# Patient Record
Sex: Female | Born: 1935 | Race: Black or African American | Hispanic: No | Smoking: Former smoker
Health system: Southern US, Community
[De-identification: ages and names within clinical notes are randomized; demographics above are authoritative.]

## PROBLEM LIST (undated history)

## (undated) DIAGNOSIS — R350 Frequency of micturition: Secondary | ICD-10-CM

## (undated) DIAGNOSIS — J189 Pneumonia, unspecified organism: Secondary | ICD-10-CM

## (undated) DIAGNOSIS — I509 Heart failure, unspecified: Secondary | ICD-10-CM

## (undated) DIAGNOSIS — F0390 Unspecified dementia without behavioral disturbance: Secondary | ICD-10-CM

## (undated) DIAGNOSIS — J449 Chronic obstructive pulmonary disease, unspecified: Secondary | ICD-10-CM

## (undated) DIAGNOSIS — K5900 Constipation, unspecified: Secondary | ICD-10-CM

## (undated) DIAGNOSIS — I10 Essential (primary) hypertension: Secondary | ICD-10-CM

## (undated) DIAGNOSIS — M4800 Spinal stenosis, site unspecified: Secondary | ICD-10-CM

## (undated) DIAGNOSIS — R32 Unspecified urinary incontinence: Secondary | ICD-10-CM

## (undated) HISTORY — DX: Constipation, unspecified: K59.00

## (undated) HISTORY — DX: Unspecified urinary incontinence: R32

## (undated) HISTORY — DX: Frequency of micturition: R35.0

## (undated) HISTORY — DX: Essential (primary) hypertension: I10

---

## 1989-05-29 HISTORY — PX: COLONOSCOPY: SHX174

## 1997-08-04 ENCOUNTER — Ambulatory Visit (HOSPITAL_COMMUNITY): Admission: RE | Admit: 1997-08-04 | Discharge: 1997-08-04 | Payer: Self-pay | Admitting: Internal Medicine

## 1997-12-22 ENCOUNTER — Ambulatory Visit (HOSPITAL_COMMUNITY): Admission: RE | Admit: 1997-12-22 | Discharge: 1997-12-22 | Payer: Self-pay | Admitting: Internal Medicine

## 1998-02-05 ENCOUNTER — Ambulatory Visit (HOSPITAL_COMMUNITY): Admission: RE | Admit: 1998-02-05 | Discharge: 1998-02-05 | Payer: Self-pay | Admitting: *Deleted

## 1998-03-24 ENCOUNTER — Ambulatory Visit (HOSPITAL_COMMUNITY): Admission: RE | Admit: 1998-03-24 | Discharge: 1998-03-24 | Payer: Self-pay | Admitting: Internal Medicine

## 1999-02-04 ENCOUNTER — Ambulatory Visit (HOSPITAL_COMMUNITY): Admission: RE | Admit: 1999-02-04 | Discharge: 1999-02-04 | Payer: Self-pay | Admitting: *Deleted

## 1999-04-15 ENCOUNTER — Encounter: Admission: RE | Admit: 1999-04-15 | Discharge: 1999-04-15 | Payer: Self-pay | Admitting: Internal Medicine

## 1999-04-15 ENCOUNTER — Encounter: Payer: Self-pay | Admitting: Internal Medicine

## 1999-07-04 ENCOUNTER — Encounter: Admission: RE | Admit: 1999-07-04 | Discharge: 1999-07-04 | Payer: Self-pay | Admitting: Internal Medicine

## 1999-07-04 ENCOUNTER — Encounter: Payer: Self-pay | Admitting: Internal Medicine

## 1999-10-18 ENCOUNTER — Ambulatory Visit (HOSPITAL_COMMUNITY): Admission: RE | Admit: 1999-10-18 | Discharge: 1999-10-18 | Payer: Self-pay | Admitting: Internal Medicine

## 2000-07-09 ENCOUNTER — Encounter: Admission: RE | Admit: 2000-07-09 | Discharge: 2000-07-09 | Payer: Self-pay | Admitting: Oncology

## 2000-07-09 ENCOUNTER — Encounter: Payer: Self-pay | Admitting: Internal Medicine

## 2000-12-17 ENCOUNTER — Other Ambulatory Visit: Admission: RE | Admit: 2000-12-17 | Discharge: 2000-12-17 | Payer: Self-pay | Admitting: Obstetrics and Gynecology

## 2001-03-21 ENCOUNTER — Encounter: Admission: RE | Admit: 2001-03-21 | Discharge: 2001-03-21 | Payer: Self-pay | Admitting: Internal Medicine

## 2001-03-21 ENCOUNTER — Encounter: Payer: Self-pay | Admitting: Internal Medicine

## 2001-08-07 ENCOUNTER — Encounter: Payer: Self-pay | Admitting: Internal Medicine

## 2001-08-07 ENCOUNTER — Encounter: Admission: RE | Admit: 2001-08-07 | Discharge: 2001-08-07 | Payer: Self-pay | Admitting: Internal Medicine

## 2001-09-30 ENCOUNTER — Encounter: Payer: Self-pay | Admitting: Internal Medicine

## 2001-09-30 ENCOUNTER — Encounter: Admission: RE | Admit: 2001-09-30 | Discharge: 2001-09-30 | Payer: Self-pay | Admitting: Internal Medicine

## 2002-02-11 ENCOUNTER — Ambulatory Visit (HOSPITAL_COMMUNITY): Admission: RE | Admit: 2002-02-11 | Discharge: 2002-02-11 | Payer: Self-pay | Admitting: Internal Medicine

## 2002-08-11 ENCOUNTER — Encounter: Payer: Self-pay | Admitting: Internal Medicine

## 2002-08-11 ENCOUNTER — Ambulatory Visit (HOSPITAL_COMMUNITY): Admission: RE | Admit: 2002-08-11 | Discharge: 2002-08-11 | Payer: Self-pay | Admitting: Internal Medicine

## 2003-04-30 ENCOUNTER — Emergency Department (HOSPITAL_COMMUNITY): Admission: EM | Admit: 2003-04-30 | Discharge: 2003-05-01 | Payer: Self-pay | Admitting: Emergency Medicine

## 2004-09-07 ENCOUNTER — Emergency Department (HOSPITAL_COMMUNITY): Admission: EM | Admit: 2004-09-07 | Discharge: 2004-09-07 | Payer: Self-pay | Admitting: Emergency Medicine

## 2005-05-10 ENCOUNTER — Emergency Department (HOSPITAL_COMMUNITY): Admission: EM | Admit: 2005-05-10 | Discharge: 2005-05-10 | Payer: Self-pay | Admitting: Emergency Medicine

## 2005-05-11 ENCOUNTER — Ambulatory Visit (HOSPITAL_COMMUNITY): Admission: RE | Admit: 2005-05-11 | Discharge: 2005-05-11 | Payer: Self-pay | Admitting: Internal Medicine

## 2005-06-01 ENCOUNTER — Inpatient Hospital Stay (HOSPITAL_COMMUNITY): Admission: EM | Admit: 2005-06-01 | Discharge: 2005-06-22 | Payer: Self-pay | Admitting: Emergency Medicine

## 2005-06-04 ENCOUNTER — Ambulatory Visit: Payer: Self-pay | Admitting: Internal Medicine

## 2006-06-22 ENCOUNTER — Emergency Department (HOSPITAL_COMMUNITY): Admission: EM | Admit: 2006-06-22 | Discharge: 2006-06-22 | Payer: Self-pay | Admitting: Emergency Medicine

## 2008-03-19 ENCOUNTER — Inpatient Hospital Stay (HOSPITAL_COMMUNITY): Admission: EM | Admit: 2008-03-19 | Discharge: 2008-03-30 | Payer: Self-pay | Admitting: Emergency Medicine

## 2008-03-19 ENCOUNTER — Ambulatory Visit: Payer: Self-pay | Admitting: Internal Medicine

## 2008-03-20 ENCOUNTER — Encounter (INDEPENDENT_AMBULATORY_CARE_PROVIDER_SITE_OTHER): Payer: Self-pay | Admitting: Internal Medicine

## 2010-10-11 NOTE — Discharge Summary (Signed)
[name], [name]            ACCOUNT NO.:  [id number]   MEDICAL RECORD NO.:  [id number]          PATIENT TYPE:  INP   LOCATION:  1317                         FACILITY:  [facility]   PHYSICIAN:  Isidor Holts, M.D.  DATE OF BIRTH:  05/14/1920   DATE OF ADMISSION:  03/19/2008  DATE OF DISCHARGE:                               DISCHARGE SUMMARY   ADDENDUM TO DISCHARGE SUMMARY:   DATE OF DISCHARGE:  March 30, 2008.   For discharge diagnoses, refer to interim summary dictated March 24, 2008 by Dr. Hillery Aldo.  For procedures and detailed clinical  course, refer to above-mentioned summary.  However, for the period from  March 25, 2008 to March 29, 2008, the following are pertinent:  The  patient's clinical condition remained stable.  She completed the course  of antibiotic therapy for E-coli urinary tract infection as well as  aspiration pneumonia.  Antibiotics were discontinued on March 26, 2008  without any deleterious effects.  Hemoglobin has remained  stable/reasonable.  As a matter of fact on March 25, 2008 hemoglobin  was 9.3 with hematocrit of 28.1.  Iron studies showed the following  findings:  Iron 44, TIBC 146, percentage saturation 30, ferritin 417.  These findings against background of normocytosis, suggest anemia of  chronic disease.  There were no problems referable to COPD.  The patient  is of course demented.  She does have a known history of spinal  stenosis, however, there have been no problems referable to this and  during the above-mentioned period of time, the patient's BP has remained  stable/controlled.  As a matter of fact on March 29, 2008, BP was  131/69 mmHg.  The patient has been evaluated by PT/OT, and skilled  nursing facility recommended.  This has been discussed with the  patient's family and the family is agreeable with SNF placement.   DISCHARGE MEDICATIONS:  1. Multivitamin 1 p.o. daily.  2. Norvasc 10 mg p.o. daily.  3. Resource  Wild Berry liquid nutritional supplement 240 mL p.o.      b.i.d.  4. Senokot-S 1 p.o. p.r.n. nightly.  5. Tylenol 650 mg p.o. p.r.n. every 4 hourly.   DISPOSITION:  Provided placement can be effected, it is anticipated that  the patient will be discharged on March 30, 2008 or March 31, 2008.   DIET:  Heart-healthy.   ACTIVITY:  As tolerated.  Otherwise per PT/OT.   FOLLOWUP INSTRUCTIONS:  The patient is to follow up with nursing  facility medical doctor.   SPECIAL INSTRUCTIONS:  Continued PT/OT is recommended in the skilled  nursing facility environment.      Isidor Holts, M.D.  Electronically Signed     CO/MEDQ  D:  03/29/2008  T:  03/29/2008  Job:  [id number]

## 2010-10-11 NOTE — Group Therapy Note (Signed)
[name], [name]            ACCOUNT NO.:  [id number]   MEDICAL RECORD NO.:  [id number]          PATIENT TYPE:  INP   LOCATION:  1317                         FACILITY:  [facility]   PHYSICIAN:  Hillery Aldo, M.D.   DATE OF BIRTH:  [birth year]                                 PROGRESS NOTE   PRIMARY CARE PHYSICIAN:  None.   DISCHARGE DIAGNOSES:  1. Escherichia coli urinary tract infection.  2. Right middle lobe aspiration pneumonia.  3. Weakness with fall prior to admission.  4. Dehydration  5. Hypernatremia.  6. Hypokalemia.  7. Hypertension.  8. Normocytic anemia.  9. Diarrhea.  10.History of dementia.  11.History of spinal stenosis.  12.History of chronic obstructive pulmonary disease.   DISCHARGE MEDICATIONS:  Will be dictated time of actual discharge.   CONSULTATIONS:  None.   HISTORY OF PRESENT ILLNESS:  The patient is an 77 year old female who was not under the care of any  medical doctor, who was brought in by her daughter secondary to the  patient falling at home and being weak with inability to ambulate with  her walker.  The patient was living independently prior to admission and  therefore was admitted for further evaluation and workup.  For full  details, please see the dictated report done by Dr. Wilburn Mylar.   PROCEDURES AND DIAGNOSTIC STUDIES:  1. CT scan of the head on March 19, 2008 showed no acute      intracranial abnormality with chronic microvascular ischemic      changes and atherosclerosis.  2. Chest x-ray on March 21, 2008 showed possible early right midlung      zone infiltrate and cardiomegaly.   HOSPITAL COURSE BY PROBLEM:  1. .  Escherichia coli urinary tract infection.  The patient was      empirically put on Cipro.  Subsequent urine cultures grew out      Escherichia coli that was Cipro sensitive.  Her antibiotic coverage      was broadened when she developed signs of a possible aspiration      pneumonia.  She remains on antibiotic  therapy with Zosyn at this      time.  2. Aspiration pneumonia:  The patient had an episode of choking on      food that was noted by the nursing staff.  They also noted      increased congestion following this episode which prompted a chest      radiograph.  The radiographic findings were as noted above.  The      patient's antibiotic therapy was broadened to Zosyn and a      swallowing evaluation was ordered.  The patient did not demonstrate      any signs of aspiration during this evaluation and it was felt to      be an isolated event.  She will get a follow-up chest radiograph in      the morning and if it is clearing, we can amend her antibiotic      therapy further.  3. Weakness with outpatient fall.  The patient has been seen in  consultation with physical and occupational therapist.  They are      recommending skilled nursing home placement at this time unless 24-      hour supervision can be provided by the family.  4. Dehydration/hypernatremia:  The patient was put on hypotonic IV      fluids and this has resolved.  5. Hypertension:  The patient was noted to be hypertensive during her      hospital stay.  She was put on Norvasc which has controlled her      blood pressure well.  6. Normocytic anemia:  The patient did undergo vitamin B12 and RBC      folate studies which were found to be within normal limits.  Her      anemia is likely due to chronic disease.  Her hemoglobin and      hematocrit have stabilized after an initial drop that was thought      to be dilutional due to dehydration and repletion with IV fluids.      Nevertheless, we are going to heme-check her stools and evaluate      her iron stores as part of her workup.  The studies are pending at      this time.  7. Diarrhea.  The patient has been on antibiotics.  C-difficile toxin      studies have been negative.  This os likely a side effect of      antibiotic therapy.  We will continue to monitor for signs  of      electrolytes or fluid balance problems.   DISPOSITION:  Patient is approaching medical stability and will need placement in a  skilled nursing facility unless 24-hour supervision can be provided.  Care managers are attempting to contact the patient's family to discuss  these options at this time.   A follow-up discharge summary addendum will be dictated at the time of  actual discharge.      Hillery Aldo, M.D.  Electronically Signed     CR/MEDQ  D:  03/24/2008  T:  03/24/2008  Job:  [id number]

## 2010-10-11 NOTE — H&P (Signed)
NAMESANDY, HAYE            ACCOUNT NO.:  [id number]   MEDICAL RECORD NO.:  [id number]          PATIENT TYPE:  EMS   LOCATION:  ED                           FACILITY:  [facility]   PHYSICIAN:  Thomasenia Bottoms, MDDATE OF BIRTH:  [birth year]   DATE OF ADMISSION:  03/19/2008  DATE OF DISCHARGE:                              HISTORY & PHYSICAL   CHIEF COMPLAINT:  Fall.   HISTORY OF PRESENT ILLNESS:  Ms. Vanliew is an 77 year old who lives  alone and brought in by her daughter tonight because the patient fell  today and has been unable to walk even with her walker.  She has just  been very weak.  The patient denies any pain.  The patient normally  lives alone and gets along with her walker.  Her daughter brings all of  her meals over to the house.  The patient is not able to provide any  history.   PAST MEDICAL HISTORY:  1. Dementia.  2. Fecal incontinence.  3. History of anemia.  4. History of COPD.  5. Spinal stenosis.   Her last hospitalization was in 2007 and was also because of fall.   MEDICATIONS:  The patient apparently takes no medicines.   PRIMARY CARE PHYSICIAN:  She has no primary care physician at this time.   SOCIAL HISTORY:  She does not smoke cigarettes, drink alcohol or use any  illicit drugs.   FAMILY HISTORY:  Noncontributory.   REVIEW OF SYSTEMS:  The patient is not able to provide this.  The  daughter does say that the patient eats like a bird, but this is her  baseline.  Normally, she is able to get around with a walker.   PHYSICAL EXAMINATION:  VITAL SIGNS:  In the emergency department on  arrival, her T-max was 98.2.  Her blood pressure was 145/82 with pulse  80, respiratory rate 18 and O2 sats 100% on room air.  GENERAL:  The patient is an elderly woman in no acute distress.  She  does not talk much.  She will answer my questions with one-word answers  only.  She is a frail elderly woman.  HEENT:  Normocephalic, atraumatic.  Pupils are round.   Sclerae  nonicteric.  Oral mucosa moist.  NECK:  Supple.  No lymphadenopathy, thyromegaly or jugular venous  distention.  CARDIAC:  Regular rate and rhythm with a systolic murmur.  LUNGS:  Clear to auscultation bilaterally.  No wheezes, rhonchi or  rales.  ABDOMEN:  Soft, nontender, nondistended.  Normoactive bowel sounds.  No  masses are appreciated.  No hepatosplenomegaly.  EXTREMITIES:  No evidence of clubbing, cyanosis or edema.  NEUROLOGICAL:  She is alert and oriented x3.  Her cranial nerves II-XII  are intact grossly.  She moves both of her arms after extensive coaxing.  She says she is unable to lift her legs off the bed.  She has symmetric  finger squeeze bilaterally.  She does not answer my questions to  orientation.  SKIN:  Some minor abrasions which are mild.  She has extensive dry skin  of her feet.  No open  lesions or rashes noted.  MUSCULOSKELETAL:  No evidence of effusion of her joints.  She resists  me, and it is difficult for me to assess her range of motion.   LABORATORY DATA:  White count 13.2, hemoglobin 11, hematocrit 32.4,  platelet count 212,000.  Sodium 146, potassium 3.1, chloride 112, bicarb  22, glucose 140, BUN 27, creatinine 1, AST 52.  Urinalysis reveals  cloudy urine with positive nitrite, positive moderate leukocyte esterase  and 7-10 WBCs.  She did have a head CT which revealed no acute  intracranial abnormality.   ASSESSMENT AND PLAN:  1. This is an 78 year old woman with weakness and a fall.  She does      have a urinary tract infection.  Will admit her to the hospital and      put her on IV Cipro.  2. Mild dehydration and hypernatremia as well as elevated BUN.  Will      put her on IV fluids and follow.  3. Hypokalemia.  We will replace this both IV and p.o.  The patient's      daughter would like the patient to be able to go back to home with      home nursing and home PT, but, given the fact that the patient has      dementia and an unsteady  gait, I wonder if it is safe for her to be      alone.  Her last hospitalization was in 2007, and nursing home      placement was recommended at that time.  After her urinary tract      infection is treated, we will have her evaluated by physical      therapy.  Also, the daughter would like recommendations for a new      primary care physician as the patient currently has none.      Thomasenia Bottoms, MD  Electronically Signed     CVC/MEDQ  D:  03/19/2008  T:  03/20/2008  Job:  [id number]

## 2010-10-14 NOTE — Consult Note (Signed)
[name], [name]            ACCOUNT NO.:  [id number]   MEDICAL RECORD NO.:  [id number]          PATIENT TYPE:  INP   LOCATION:  1414                         FACILITY:  [facility]   PHYSICIAN:  Jordan Hawks. Elnoria Howard, MD    DATE OF BIRTH:  12/23/1919   DATE OF CONSULTATION:  06/02/2005  DATE OF DISCHARGE:                                   CONSULTATION   REFERRING PHYSICIAN:  Eric L. August Saucer, M.D.   REASON FOR CONSULTATION:  Diarrhea and weakness.   HISTORY OF PRESENT ILLNESS:  This is an 77 year old, black female with the  past medical history of mild dementia and COPD admitted for recurrent and  intermittent diarrhea.  The patient was brought in by her daughter after she  was found to be in a pool of her excrement at home.  The patient is not able  to provide an accurate history and details were obtained from the chart.  The patient denies having any types of fever and she believes she has had  diarrhea for the past 2 days.  She has not noted any hematochezia or melena.  The patient states that overall she is in her usual state of health,  although she has had some bilateral lower extremity edema.  The patient  believes that she had a colonoscopy 2-3 years ago and it was performed by a  local physician, however, she is uncertain of the physician's name and the  results of the colonoscopy.  To the patient's knowledge, she denies having  any family history of colon cancer.  Upon admission, the patient was  provided with Imodium and she states that has helped prevent any further  diarrhea at this time.  Additionally, she does have some complaints of a  very mild left lower quadrant abdominal pain without any radiation.   PAST MEDICAL HISTORY:  As stated above.   PAST SURGICAL HISTORY:  As stated above.   FAMILY HISTORY:  Noncontributory.   SOCIAL HISTORY:  The patient lives alone, however, has close family support  from her daughter and a sister who lives nearby.   ALLERGIES:  No known  drug allergies.   MEDICATIONS:  1.  Spironolactone 25 mg p.o. daily.  2.  Iron 325 mg p.o. daily.   REVIEW OF SYSTEMS:  Unable to accurately obtain secondary to the patient's  dementia.   PHYSICAL EXAMINATION:  VITAL SIGNS:  Blood pressure is 126/67, heart rate  69, temperature 99, respirations 20.  GENERAL:  The patient is in no acute distress.  She is alert and oriented to  herself and place.  HEENT:  Normocephalic, atraumatic, extraocular movements intact.  Pupils  equal round and reactive to light.  NECK:  Supple.  No lymphadenopathy.  LUNGS:  Clear to auscultation bilaterally.  CARDIAC:  Regular rate and rhythm without murmurs, rubs or gallops.  ABDOMEN:  Flat, soft, minimal tenderness with deep palpation over the left  lower quadrant.  No rebound.  Positive bowel sounds.  No hepatosplenomegaly.  EXTREMITIES:  No clubbing, cyanosis or edema.  RECTAL:  Negative for any blood.  There is solid stool that is  palpated in  the rectal vault.  The patient has excellent resting sphincter tone.   LABORATORY DATA AND X-RAY FINDINGS:  White blood cell count 8.6, hemoglobin  10.1, MCV 89.3, platelets 226.  Sodium 141, potassium 3.5, chloride 108, CO2  26, glucose 100, BUN 26, creatinine 0.9.  Total bilirubin 0.6, Alk phos 57,  AST 24, ALT 13, albumin 3.3.  Urinalysis is negative for any abnormality.   IMPRESSION:  1.  History of diarrhea.  2.  Anemia.  3.  Mild left lower quadrant pain.  4.  Mild dementia.  5.  Mild chronic obstructive pulmonary disease.   At this time, I am unable to discern an etiology for the patient's diarrhea.  Certainly, the rectal examination is very revealing in that she does have  solid stool in the rectal vault.  Certainly, this can be a consequence of  Imodium that was provided, however, no rectal mass has been palpated at this  time and she has excellent sphincter tone.  Therefore, an incontinence issue  as a result of sphincter abnormalities is not  apparent at this time.  She  appears to be in stable condition.  She was noted to have some mild left  lower quadrant pain, however, there is no elevation in her white blood cell  count.  She is noted to have a very mild temperature at 100.1 upon admission  and is currently at 99.  She does not have the clinical signs and symptoms  that are consistent with a diverticulitis.  Although if the pain worsens, a  computed tomography scan of the abdomen can be performed.  The differential  diagnoses for the patient's diarrhea ranges from infectious, inflammatory,  such as a microscopic colitis, malignant with result in overflow  incontinence, secondary to medications or that may be taken over-the-counter  or as a result of artificial sweeteners such as sorbitol.   RECOMMENDATIONS:  1.  Plan at this time is to check the stool studies including a C. difficile      toxin.  2.  Hold the Imodium at this time to allow for diarrhea to manifest.  3.  Attempt to obtain a colonoscopy report.  4.  If diarrhea persists without clear etiology, with stool studies being      negative, a colonoscopy versus a flexible sigmoidoscopy will be pursued      with biopsy.  5.  If the patient is stable and discharged during this weekend, I will      follow her up in the office.  6.  Check an iron panel and ferritin to work up the anemia.      Jordan Hawks Elnoria Howard, MD  Electronically Signed     PDH/MEDQ  D:  06/02/2005  T:  06/03/2005  Job:  [id number]

## 2010-10-14 NOTE — Discharge Summary (Signed)
NAME:  [name], [name]            ACCOUNT NO.:  [id number]   MEDICAL RECORD NO.:  [id number]          PATIENT TYPE:  INP   LOCATION:  1414                         FACILITY:  [facility]   PHYSICIAN:  Eric L. August Saucer, M.D.     DATE OF BIRTH:  02-Aug-1919   DATE OF ADMISSION:  06/01/2005  DATE OF DISCHARGE:  06/21/2005                                 DISCHARGE SUMMARY   FINAL DIAGNOSES:  1.  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  2.  Dementia.  3.  Spinal stenosis of the lumbar sacral spine.  4.  Unsteady gait.  5.  Chronic obstructive pulmonary disease.  6.  Anemia of chronic disease.  7.  Cellulitis, left lower extremity, resolved.  8.  Deconditioning.  9.  Unsafe home environment.   OPERATIONS/PROCEDURES:  1.  CT scan lumbosacral spine.  2.  CT scan of the abdomen and pelvis.  3.  Bone scan.   HISTORY OF PRESENT ILLNESS:  This was the first recent [facility]  admission for this 77 year old, widowed, black female with a longstanding  history of mild dementia with a past history of COPD.  The patient had most  recently been lost to followup over the past year and had been seen in the  office just recently with lower extremity edema.  The patient was started on  spironolactone 25 mg daily.  For the past 10 days prior to admission, the  patient had developed increasing bouts of diarrhea.  This was associated  with progressive weakness as well.  The patient had noted __________  lower  abdominal discomfort though denied significant pain at the time of  presentation.  Daughter reports that __________ has been fair to good at  most.  The patient, however, on the day of admission did not respond to her  daughter's call to the house.  When found, the patient was surrounded by  stool and urine and unable to walk.  She was subsequently brought in for  further evaluation.   PAST MEDICAL HISTORY:  Per admission H&P.   PHYSICAL EXAMINATION:  Per admission H&P.   HOSPITAL COURSE:  The patient was admitted for further evaluation and  treatment of refractory diarrhea of questionable etiology.  She was known to  have some dementia as well, past history of tobacco abuse.  At the time of  presentation there was evidence for dehydration.  She had lower leg pains as  well with a question of DVT.  The patient was placed on IV fluids for  rehydration.  Stool specimens were obtained for leukocytes, C. difficile,  and enteric pathogens.  The night of admission, the patient had an extremely  large bowel movement.  She thereafter had no significant diarrhea.  Subsequent abdominal films showed no evidence for obstruction.  There was no  evidence for ileus as well.   The patient was seen in consultation by Dr. Elnoria Howard of gastroenterology.  He  entertained a number of possibilities for the cause of her diarrhea.  The  stool studies, as noted, were negative for C. difficile, leukocytes, or  enteric pathogens.  After the  patient's large BM, she had no further  significant diarrhea.  It was felt that she had a nonsphincter fecal  incontinence phenomenon.  The patient was maintained on stool softeners with  Metamucil.  She was started on a full liquid diet and gradually advanced  thereafter.   The patient at the time of presentation also complained of left lower leg  pain.  A venous Doppler study was done which showed no evidence for DVT,  superficial thrombus, or Baker cyst.  She was however, due to her  inactivity, placed on Lovenox per protocol.  Over the subsequent days this  did gradually improve.  It was noted, however, that the patient had some  persistence of warmth in the leg with minimal erythematous changes.  A sed  rate was noted to be elevated as well.  She was treated empirically for a  possible early cellulitis.  X-rays of the legs were obtained as well which  raised the question of some periosteal reaction in the bones.  A subsequent  bone scan was  obtained which showed no evidence for osteomyelitis.  The  patient, however, was treated with a 10-day course of Avelox with gradual  resolution of the redness as well as decreased tenderness.  Her sed rates  did drop from high 70s to 50s.   Despite the above measures, the patient continued to have difficulty with  gait.  X-ray of the lumbosacral spine did demonstrate significant  degenerative joint disease of the L, S spine with some spondylolisthesis.  A  CT scan of the lumbosacral spine did demonstrate moderate spinal stenosis in  the lower lumbosacral spine at L4-L5.  Because of the patient's gait  disturbance, physical therapy did work with the patient initially as well as  OT.  It was felt that the patient would be a candidate for [facility] for further  rehabilitative services.  Efforts at getting the patient admitted, however,  were unsuccessful initially.  After being successful, it was subsequently  not approved by her insurance plan.  She was continued on the floor for  occupational therapy and physical therapy.  Efforts were thereafter pursued  for other alternatives.  It is presently felt that the patient would now be  a candidate for a skilled nursing facility with intensive physical therapy.   The patient's level of dementia, deconditioning, and weakness in the lower  extremities with associated spinal stenosis makes it unsafe for her to be at  home unattended.  After extensive search, family members have agreed upon  the skilled nursing facility.  Arrangements are being made for the patient  to be transferred to the facility at this time.   Presently, the patient is feeling well with no specific complaints.  Her  vital signs are stable.  Examination heart and lungs and abdomen is  unremarkable at this time.  She still has minimal tenderness in the left  lower leg but negative Homan's.  Evidence for arthritis as previously noted.   DISCHARGE MEDICATIONS:  1.  Multivitamin one  daily. 2.  Lovenox 40 mg subcu q.h.s.  3.  Psyllium one packet in water b.i.d.  4.  Ferrous sulfate 325 mg every day.  5.  Ensure Plus supplement one can b.i.d.  6.  Tylenol 325 mg 1-2 p.o. q.4h. p.r.n. pain.   She is to be maintained on a 4 gram sodium soft diet, advance as tolerated.   She will need ongoing physical therapy and supervisory care.  ______________________________  Lind Guest August Saucer, M.D.    ELD/MEDQ  D:  06/21/2005  T:  06/21/2005  Job:  [id number]

## 2010-10-14 NOTE — H&P (Signed)
NAME:  [name], [name]            ACCOUNT NO.:  [id number]   MEDICAL RECORD NO.:  [id number]          PATIENT TYPE:  INP   LOCATION:  1414                         FACILITY:  [facility]   PHYSICIAN:  Eric L. August Saucer, M.D.     DATE OF BIRTH:  11/21/1919   DATE OF ADMISSION:  06/01/2005  DATE OF DISCHARGE:                                HISTORY & PHYSICAL   CHIEF COMPLAINT:  Refractory diarrhea with progressive weakness.   HISTORY OF PRESENT ILLNESS:  First recent most [facility]  admission for this 77 year old widowed black female with longstanding  history of mild dementia with past history of COPD. The patient had been  lost to follow up over the past year and had recently been seen in the  office with lower extremity edema. The patient was started on spironolactone  25 milligrams daily. Daughter relates for the past 10 days the patient has  had intermittent bouts of diarrhea. This has been associated with  progressive weakness as well. The patient has had intermittent lower  abdominal discomfort, though apparently denies significant pain. Appetite  has been good per daughter's report. The patient, however, today did not  respond to the daughter's call. The patient was found at home surrounding by  stool and urine and unable to walk. She did complain of some pain in the  legs with swelling as well. Denies significant chest pain or shortness of  breath.   The patient was brought to the emergency room for further evaluation and was  admitted at this time.   PAST MEDICAL HISTORY:  As noted above. No known surgeries. History of  tobacco abuse but presently abstaining for approximately six months' time.  No history of ETOH use.   The patient with no known surgeries.   FAMILY HISTORY:  Noncontributory.   SOCIAL HISTORY:  The patient is widowed. Presently, still lives alone though  with close support from a sister who lives next door and a daughter who  lives across the  street.   The patient has no known allergies.   PRESENT MEDICATIONS:  1.  Ferrous sulfate 325 milligrams daily.  2.  Spironolactone 25 milligrams p.o. daily.   PHYSICAL EXAMINATION:  GENERAL:  She is a weak-appearing black female in no  acute distress.  VITAL SIGNS:  Initially, a blood pressure was 119/65, pulse of 109,  respiratory rate 16. Saturations were 96% on room air. She was indeed  afebrile.  HEENT:  Head normocephalic and atraumatic without bruits. Extraocular  movements intact. There was no sinus tenderness. TMs were clear. Throat  shows membranes were slightly dry. Posterior pharynx clear.  NECK:  No posterior cervical nodes. No enlarged thyroid. No carotid bruits.  LUNGS:  Notable for a few basilar rhonchi, left greater than right. No E to  A changes. No wheezes or rubs appreciated.  CARDIOVASCULAR:  Normal S1 and S2. No S3 or S4. No murmurs or rubs.  ABDOMEN:  Bowel sounds are hypoactive but present. She does have left lower  quadrant tenderness on deep palpation. Negative rebound tenderness. No  masses appreciated otherwise. No fullness or tenderness.  MUSCULOSKELETAL:  The patient has trace pitting edema bilaterally. On the  left leg, there is a mild increased warmth. She does have tenderness along  the superficial cords, left leg greater than right. Notably pulses are trace  to 1+ dorsalis pedis bilaterally.  NEUROLOGICAL:  The patient was alert and oriented to person, place and time  today. Upper extremities show strength is symmetric and 4/5. Lower  extremities are intact as well.   LABORATORY DATA:  CBC reveals a WBC of 8600, hemoglobin 10.1, hematocrit  29.3, platelets of 226,000. There is a normal differential. Chemistry shows  sodium 141, potassium 3.5, chloride 108, CO2 26, BUN 26, creatinine of 0.9,  glucose of 100. Calcium 8.8, albumin slightly low at 3.3. SGOT/SGPT within  normal limits respectively.   Urinalysis still pending. X-ray of the abdomen  showed no acute findings. No  evidence of obstruction. She was noted to have COPD and mild cardiomegaly by  x-ray as well. She was also noted to have uterine fibroids which were  calcified.   IMPRESSION:  1.  Recurrent diarrhea of questionable etiology:  Rule out secondary to      medication effect versus occult gastroenteritis versus other. Nothing      apparent by initial x-rays.  2.  Dementia with high risk for progression while hospitalized.  3.  Previous tobacco abuse with chronic obstructive pulmonary disease,      presently abstaining.  4.  Dehydration secondary to above.  5.  Rule out deep vein thrombosis with leg pains as noted on exam.   PLAN:  The patient will be admitted to telemetry initially. We will gently  rehydrate her at this time. We will obtain stool specimens for leukocytes,  occult blood and enteric pathogens. We will review records to obtain her  last colonoscopy as she has been poorly followed in the past due to  noncompliance. Further evaluation pending results of the above. We will also  place the patient on Lovenox for DVT prophylaxis and plans for venous  Doppler's in the a.m. Continue nutritional support. Further therapy pending  response of the above with further GI evaluation as well.           ______________________________  Lind Guest. August Saucer, M.D.     ELD/MEDQ  D:  06/01/2005  T:  06/02/2005  Job:  [id number]

## 2011-02-27 LAB — VITAMIN B12: Vitamin B-12: 399 (ref 211–911)

## 2011-02-27 LAB — CBC
HCT: 27.6 — ABNORMAL LOW
HCT: 28.1 — ABNORMAL LOW
HCT: 28.5 — ABNORMAL LOW
HCT: 29.2 — ABNORMAL LOW
HCT: 32.4 — ABNORMAL LOW
HCT: 33 — ABNORMAL LOW
Hemoglobin: 11 — ABNORMAL LOW
Hemoglobin: 9.3 — ABNORMAL LOW
Hemoglobin: 9.6 — ABNORMAL LOW
MCHC: 33.3
MCHC: 33.7
MCHC: 33.8
MCV: 91
MCV: 91.3
MCV: 92
MCV: 92.1
MCV: 92.7
MCV: 92.9
Platelets: 153
Platelets: 162
Platelets: 169
Platelets: 178
Platelets: 209
Platelets: 238
RBC: 3 — ABNORMAL LOW
RBC: 3.13 — ABNORMAL LOW
RBC: 3.19 — ABNORMAL LOW
RDW: 16.6 — ABNORMAL HIGH
RDW: 17.3 — ABNORMAL HIGH
WBC: 10.4
WBC: 13.2 — ABNORMAL HIGH
WBC: 13.2 — ABNORMAL HIGH
WBC: 14 — ABNORMAL HIGH
WBC: 16.6 — ABNORMAL HIGH
WBC: 9.3
WBC: 9.8

## 2011-02-27 LAB — URINALYSIS, ROUTINE W REFLEX MICROSCOPIC
Nitrite: POSITIVE — AB
Specific Gravity, Urine: 1.024
Urobilinogen, UA: 0.2
pH: 5.5

## 2011-02-27 LAB — BASIC METABOLIC PANEL
BUN: 11
BUN: 12
BUN: 14
BUN: 15
BUN: 19
BUN: 9
CO2: 23
CO2: 23
CO2: 26
Calcium: 8.5
Chloride: 112
Chloride: 113 — ABNORMAL HIGH
Chloride: 114 — ABNORMAL HIGH
Chloride: 115 — ABNORMAL HIGH
Chloride: 116 — ABNORMAL HIGH
Creatinine, Ser: 0.75
Creatinine, Ser: 0.76
Creatinine, Ser: 0.8
Creatinine, Ser: 1.01
GFR calc Af Amer: >60
GFR calc Af Amer: >60
GFR calc Af Amer: >60
GFR calc non Af Amer: >60
GFR calc non Af Amer: >60
GFR calc non Af Amer: >60
Glucose, Bld: 113 — ABNORMAL HIGH
Glucose, Bld: 84
Glucose, Bld: 84
Potassium: 3.1 — ABNORMAL LOW
Potassium: 3.1 — ABNORMAL LOW
Potassium: 3.6
Potassium: 4.1
Potassium: 5.6 — ABNORMAL HIGH
Sodium: 137
Sodium: 146 — ABNORMAL HIGH

## 2011-02-27 LAB — URINE MICROSCOPIC-ADD ON

## 2011-02-27 LAB — COMPREHENSIVE METABOLIC PANEL
Alkaline Phosphatase: 63
BUN: 27 — ABNORMAL HIGH
Chloride: 112
Creatinine, Ser: 1.02
GFR calc non Af Amer: 51 — ABNORMAL LOW
Glucose, Bld: 140 — ABNORMAL HIGH
Potassium: 3.1 — ABNORMAL LOW
Total Bilirubin: 1.4 — ABNORMAL HIGH

## 2011-02-27 LAB — CLOSTRIDIUM DIFFICILE EIA: C difficile Toxins A+B, EIA: NEGATIVE

## 2011-02-27 LAB — FOLATE RBC: RBC Folate: 854 — ABNORMAL HIGH

## 2011-02-27 LAB — DIFFERENTIAL
Basophils Absolute: 0
Basophils Relative: 0
Monocytes Absolute: 1.4 — ABNORMAL HIGH
Neutro Abs: 10.8 — ABNORMAL HIGH
Neutrophils Relative %: 82 — ABNORMAL HIGH

## 2011-02-27 LAB — MAGNESIUM: Magnesium: 1.9

## 2011-02-27 LAB — IRON AND TIBC: Iron: 44

## 2011-02-27 LAB — URINE CULTURE

## 2011-02-27 LAB — FERRITIN: Ferritin: 417 — ABNORMAL HIGH (ref 10–291)

## 2011-02-27 LAB — TSH: TSH: 0.757

## 2012-12-23 ENCOUNTER — Encounter: Payer: Self-pay | Admitting: *Deleted

## 2012-12-24 ENCOUNTER — Ambulatory Visit (INDEPENDENT_AMBULATORY_CARE_PROVIDER_SITE_OTHER): Payer: Medicare Other | Admitting: Internal Medicine

## 2012-12-24 ENCOUNTER — Encounter: Payer: Self-pay | Admitting: Internal Medicine

## 2012-12-24 VITALS — BP 138/80 | HR 80 | Temp 97.7°F | Resp 16 | Wt 128.0 lb

## 2012-12-24 DIAGNOSIS — K5900 Constipation, unspecified: Secondary | ICD-10-CM

## 2012-12-24 DIAGNOSIS — M899 Disorder of bone, unspecified: Secondary | ICD-10-CM

## 2012-12-24 DIAGNOSIS — F0390 Unspecified dementia without behavioral disturbance: Secondary | ICD-10-CM | POA: Insufficient documentation

## 2012-12-24 DIAGNOSIS — M949 Disorder of cartilage, unspecified: Secondary | ICD-10-CM | POA: Insufficient documentation

## 2012-12-24 DIAGNOSIS — I10 Essential (primary) hypertension: Secondary | ICD-10-CM | POA: Insufficient documentation

## 2012-12-24 NOTE — Progress Notes (Signed)
Patient ID: [name], female   DOB: 13-Apr-1920, 79 y.o.   MRN: [id number]  Chief Complaint  Patient presents with  . Establish Care   No Known Allergies  Code status- full code  HPI- 79 y/o female patient here to establish care. She was seen in our practice last 4 years back. She is here with her daughter. She has history of dementia. On review of old records there is history of depression and HTN but patient was not on any medications and currently bp appears fine and her mood has been fine.  She lives alone in a single level housing. Her daughter lives 10 minutes away and visits her 2-3 times a week. She is independent with her toileting, dressing and heating microwavable food. Her daughter cleans her house and does her grocery and shopping.  Review of Systems  Constitutional: Negative for fever, chills and diaphoresis.  HENT: Negative for hearing loss, ear pain and ear discharge.   Eyes: Negative for blurred vision, double vision and pain.       Has not seen eye doctor in years  Respiratory: Negative for cough, shortness of breath, wheezing and stridor.   Cardiovascular: Positive for leg swelling. Negative for chest pain and palpitations.  Gastrointestinal: Positive for constipation. Negative for heartburn, nausea, vomiting, abdominal pain and diarrhea.  Genitourinary: Positive for frequency. Negative for dysuria, urgency and hematuria.       Wears pull ups  Musculoskeletal: Positive for joint pain. Negative for back pain and falls.       Uses walker to get around.   Skin: Negative for rash.  Neurological: Negative for dizziness, focal weakness, seizures, loss of consciousness, weakness and headaches.  Psychiatric/Behavioral: Positive for memory loss. Negative for depression and hallucinations. The patient does not have insomnia.     Past Medical History  Diagnosis Date  . Frequency of urination   . Incontinence   . Constipation     Past Surgical History  Procedure  Laterality Date  . Colonoscopy  1991    Family History  Problem Relation Age of Onset  . Cancer Brother   . Cancer Son    History   Social History  . Marital Status: Widowed    Spouse Name: N/A    Number of Children: N/A  . Years of Education: N/A   Occupational History  . Not on file.   Social History Main Topics  . Smoking status: Former Smoker    Quit date: 09/27/1978  . Smokeless tobacco: Not on file  . Alcohol Use: No  . Drug Use: No  . Sexually Active: Not on file   Other Topics Concern  . Not on file   Social History Narrative  . No narrative on file   BP 138/80  Pulse 80  Temp(Src) 97.7 F (36.5 C) (Oral)  Resp 16  Wt 128 lb (58.06 kg)  SpO2 97%  Physical Exam  Constitutional: She appears well-developed and well-nourished. No distress.  HENT:  Head: Normocephalic and atraumatic.  Nose: Nose normal.  Mouth/Throat: Oropharynx is clear and moist. No oropharyngeal exudate.  Eyes: Conjunctivae and EOM are normal. Pupils are equal, round, and reactive to light.  Neck: Normal range of motion. Neck supple. No JVD present. No tracheal deviation present. No thyromegaly present.  Cardiovascular: Normal rate, regular rhythm, normal heart sounds and intact distal pulses.  Exam reveals no gallop and no friction rub.   No murmur heard. Pulmonary/Chest: Effort normal and breath sounds normal. No stridor. No  respiratory distress. She has no wheezes. She has no rales. She exhibits no tenderness.  Abdominal: Soft. Bowel sounds are normal. She exhibits no distension and no mass. There is no tenderness. There is no rebound and no guarding.  Musculoskeletal: Normal range of motion. She exhibits edema. She exhibits no tenderness.  Unsteady gait, uses a walker  Lymphadenopathy:    She has no cervical adenopathy.  Neurological: She is alert. No cranial nerve deficit. Coordination normal.  Skin: Skin is warm and dry. No rash noted. She is not diaphoretic. No erythema.   Psychiatric: She has a normal mood and affect. Her behavior is normal.   Labs- none available for review  MMSE 29/30  ASSESSMENT/PLAN  HTN- bp remains stable. Pt is currently off all medications. Check bmp  constipation- taken prn miralax which has been helpful. Monitor clinically  Dementia- could be a mixture of senile and vascular dementia with mild cognitive impairment. Will get reversible causes ruled out. Check cbc with diff, thyroid panel and b12 level to begin with. Monitor for behvaioral changes  Cartilage disorder- check vit d level. Continue using walker and fall precautions to be taken

## 2012-12-26 LAB — COMPREHENSIVE METABOLIC PANEL
ALT: 9 IU/L (ref 0–32)
AST: 24 IU/L (ref 0–40)
CO2: 21 mmol/L (ref 18–29)
Calcium: 9.4 mg/dL (ref 8.6–10.2)
Chloride: 105 mmol/L (ref 97–108)
Glucose: 91 mg/dL (ref 65–99)
Potassium: 3.8 mmol/L (ref 3.5–5.2)
Sodium: 145 mmol/L — ABNORMAL HIGH (ref 134–144)
Total Protein: 6.9 g/dL (ref 6.0–8.5)

## 2012-12-26 LAB — CBC WITH DIFFERENTIAL/PLATELET
Basophils Absolute: 0 10*3/uL (ref 0.0–0.2)
Eos: 0 % (ref 0–5)
HCT: 32.6 % — ABNORMAL LOW (ref 34.0–46.6)
Immature Grans (Abs): 0 10*3/uL (ref 0.0–0.1)
Immature Granulocytes: 0 % (ref 0–2)
Lymphocytes Absolute: 1 10*3/uL (ref 0.7–3.1)
MCHC: 33.7 g/dL (ref 31.5–35.7)
Monocytes: 5 % (ref 4–12)
Neutrophils Relative %: 83 % — ABNORMAL HIGH (ref 40–74)
RDW: 13.9 % (ref 12.3–15.4)
WBC: 8.5 10*3/uL (ref 3.4–10.8)

## 2012-12-26 LAB — VITAMIN D 1,25 DIHYDROXY: Vit D, 1,25-Dihydroxy: 45.4 pg/mL (ref 10.0–75.0)

## 2012-12-26 LAB — LIPID PANEL
Cholesterol, Total: 186 mg/dL (ref 100–199)
HDL: 63 mg/dL (ref >39)
VLDL Cholesterol Cal: 14 mg/dL (ref 5–40)

## 2012-12-26 LAB — VITAMIN B12: Vitamin B-12: >1999 pg/mL — ABNORMAL HIGH (ref 211–946)

## 2013-01-22 ENCOUNTER — Encounter: Payer: Self-pay | Admitting: Internal Medicine

## 2013-01-22 ENCOUNTER — Ambulatory Visit (INDEPENDENT_AMBULATORY_CARE_PROVIDER_SITE_OTHER): Payer: Medicare Other | Admitting: Internal Medicine

## 2013-01-22 VITALS — BP 118/80 | HR 84 | Temp 97.1°F | Resp 12 | Ht 58.5 in | Wt 122.0 lb

## 2013-01-22 DIAGNOSIS — F0280 Dementia in other diseases classified elsewhere without behavioral disturbance: Secondary | ICD-10-CM

## 2013-01-22 DIAGNOSIS — N189 Chronic kidney disease, unspecified: Secondary | ICD-10-CM

## 2013-01-22 DIAGNOSIS — M899 Disorder of bone, unspecified: Secondary | ICD-10-CM

## 2013-01-22 DIAGNOSIS — M949 Disorder of cartilage, unspecified: Secondary | ICD-10-CM

## 2013-01-22 DIAGNOSIS — K5900 Constipation, unspecified: Secondary | ICD-10-CM

## 2013-01-22 DIAGNOSIS — Z0000 Encounter for general adult medical examination without abnormal findings: Secondary | ICD-10-CM | POA: Insufficient documentation

## 2013-01-22 DIAGNOSIS — F0150 Vascular dementia without behavioral disturbance: Secondary | ICD-10-CM

## 2013-01-22 DIAGNOSIS — I10 Essential (primary) hypertension: Secondary | ICD-10-CM

## 2013-01-22 DIAGNOSIS — N182 Chronic kidney disease, stage 2 (mild): Secondary | ICD-10-CM | POA: Insufficient documentation

## 2013-01-22 DIAGNOSIS — E785 Hyperlipidemia, unspecified: Secondary | ICD-10-CM | POA: Insufficient documentation

## 2013-01-22 MED ORDER — TETANUS-DIPHTH-ACELL PERTUSSIS 5-2.5-18.5 LF-MCG/0.5 IM SUSP: 0.5000 mL | Freq: Once | INTRAMUSCULAR | Status: DC

## 2013-01-22 MED ORDER — DONEPEZIL HCL 10 MG PO TABS: 10.0000 mg | ORAL_TABLET | Freq: Every day | ORAL | Status: DC

## 2013-01-22 NOTE — Progress Notes (Signed)
Patient ID: [name], female   DOB: [birth year], 79 y.o.   MRN: [id number]  Chief Complaint  Patient presents with  . Annual Exam    Physical Exam, patient had recent labs. No other concerns   . Immunizations    Refuse Pneumonia, would like to update TD    No Known Allergies  Code status- full code  HPI29 79 y/o female patient here for her annual visit with her daughter present.  She has mild cognitive impairment and hx of HTN but not on any medication. Her BP has been normal today and in last visit. Her mood is stable.  Refuses influenza and pneumococcal vaccine.  No further mammogram, colonoscopy or pelvic exam as per pt request She lives alone in a single level housing. Her daughter visits her 2-3 times a week. She is independent with her toileting, dressing and heating microwavable food. Her daughter cleans her house and does her grocery and shopping.  Review of Systems  Constitutional: Negative for fever, chills and diaphoresis.  HENT: Negative for hearing loss, ear pain and ear discharge.   Eyes: Negative for blurred vision, double vision and pain.        Has not seen eye doctor in years  Respiratory: Negative for cough, shortness of breath, wheezing and stridor.   Cardiovascular: leg swelling has resolved. Negative for chest pain and palpitations.  Gastrointestinal: Negative for heartburn, nausea, vomiting, abdominal pain, constipation and diarrhea. occassionally needs miralax Genitourinary: Positive for frequency. Negative for dysuria, urgency and hematuria.       Wears pull ups  Musculoskeletal: Positive for joint pain. Negative for back pain and falls.        Uses walker to get around.   Skin: Negative for rash.  Neurological: Negative for dizziness, focal weakness, seizures, loss of consciousness, weakness and headaches.  Psychiatric/Behavioral: Positive for mild cognitive impairment. MMSE last visit 29/30. Negative for depression and hallucinations. The patient does  not have insomnia.    Past Medical History  Diagnosis Date  . Frequency of urination   . Incontinence   . Constipation   . Hypertension     Past Surgical History  Procedure Laterality Date  . Colonoscopy  1991    Family History  Problem Relation Age of Onset  . Cancer Brother   . Cancer Son    History   Social History  . Marital Status: Widowed    Spouse Name: N/A    Number of Children: N/A  . Years of Education: N/A   Occupational History  . Not on file.   Social History Main Topics  . Smoking status: Former Smoker    Quit date: 09/27/1978  . Smokeless tobacco: Not on file  . Alcohol Use: No  . Drug Use: No  . Sexual Activity: Not on file   Other Topics Concern  . Not on file   Social History Narrative  . No narrative on file   Current Outpatient Prescriptions on File Prior to Visit  Medication Sig Dispense Refill  . Multiple Vitamin (MULTIVITAMIN) tablet Take 1 tablet by mouth daily.      . polyethylene glycol (MIRALAX / GLYCOLAX) packet Take 17 g by mouth daily.       No current facility-administered medications on file prior to visit.    Physical exam  BP 118/80  Pulse 84  Temp(Src) 97.1 F (36.2 C) (Oral)  Resp 12  Ht 4' 10.5" (1.486 m)  Wt 122 lb (55.339 kg)  BMI 25.06 kg/m2  Constitutional: She appears well-developed and well-nourished. No distress.  HENT:   Head: Normocephalic and atraumatic.   Nose: Nose normal.   Ears: both impacted with cerumen Mouth/Throat: Oropharynx is clear and moist. No oropharyngeal exudate.  Eyes: Conjunctivae and EOM are normal. Pupils are equal, round, and reactive to light.  Neck: Normal range of motion. Neck supple. No JVD present. No tracheal deviation present. No thyromegaly present.  Cardiovascular: Normal rate, regular rhythm, normal heart sounds and intact distal pulses.  Exam reveals no gallop and no friction rub.   loss of hair on shin area No murmur heard. Pulmonary/Chest: Effort normal and breath  sounds normal. No stridor. No respiratory distress. She has no wheezes. She has no rales. She exhibits no tenderness.  Abdominal: Soft. Bowel sounds are normal. She exhibits no distension and no mass. There is no tenderness. There is no rebound and no guarding.  Musculoskeletal: Normal range of motion. She exhibits no tenderness. No edema Unsteady gait, uses a walker  Lymphadenopathy:    She has no cervical adenopathy.  Neurological: She is alert. No cranial nerve deficit. Coordination normal.  Skin: Skin is warm and dry. No rash noted. She is not diaphoretic. No erythema.  Psychiatric: She has a normal mood and affect. Her behavior is normal.   Labs reviewed  CBC    Component Value Date/Time   WBC 8.5 12/24/2012 1127   WBC 13.2* 03/28/2008 0515   RBC 3.78 12/24/2012 1127   RBC 2.98* 03/28/2008 0515   HGB 11.0* 12/24/2012 1127   HCT 32.6* 12/24/2012 1127   PLT 238 03/28/2008 0515   MCV 86 12/24/2012 1127   MCH 29.1 12/24/2012 1127   MCHC 33.7 12/24/2012 1127   MCHC 33.3 03/28/2008 0515   RDW 13.9 12/24/2012 1127   RDW 17.3* 03/28/2008 0515   LYMPHSABS 1.0 12/24/2012 1127   LYMPHSABS 1.1 03/19/2008 1454   MONOABS 1.4* 03/19/2008 1454   EOSABS 0.0 12/24/2012 1127   EOSABS 0.0 03/19/2008 1454   BASOSABS 0.0 12/24/2012 1127   BASOSABS 0.0 03/19/2008 1454    CMP     Component Value Date/Time   NA 145* 12/24/2012 1127   NA 147* 03/27/2008 0420   K 3.8 12/24/2012 1127   CL 105 12/24/2012 1127   CO2 21 12/24/2012 1127   GLUCOSE 91 12/24/2012 1127   GLUCOSE 113* 03/27/2008 0420   BUN 23 12/24/2012 1127   BUN 11 03/27/2008 0420   CREATININE 1.12* 12/24/2012 1127   CALCIUM 9.4 12/24/2012 1127   PROT 6.9 12/24/2012 1127   PROT 6.7 03/19/2008 1454   ALBUMIN 3.8 03/19/2008 1454   AST 24 12/24/2012 1127   ALT 9 12/24/2012 1127   ALKPHOS 62 12/24/2012 1127   BILITOT 0.5 12/24/2012 1127   GFRNONAA 43* 12/24/2012 1127   GFRAA 50* 12/24/2012 1127   Lipid Panel     Component Value Date/Time   TRIG  71 12/24/2012 1127   HDL 63 12/24/2012 1127   CHOLHDL 3.0 12/24/2012 1127   LDLCALC 109* 12/24/2012 1127   12/24/12 tsh 1.220, vit d 45.4  11/2012 MMSE 29/30  Assessment/plan  HTN- bp remains stable. Pt is currently off all medications. Check bmp. Eats mostly frozen food. Encouraged to cut down on slat intake  ckd stage 2- avoid NSAIDs. Long standing hypertension could be contributing to this with vascular changes. bp stable at present. Monitor clinically  Dementia- could be a mixture of senile and vascular dementia with mild cognitive impairment. Normal tsh and b12 level.  Monitor for behavioral changes. Will have her on donepezil 10 mg daily for now. Common side effects explained  constipation- taken prn miralax which has been helpful. Monitor clinically  Cartilage disorder- normal vitamin d. Continue using walker and fall precautions to be taken  General exam- script for tdap provided. Refuses other immunization. She is doing good for 79 y/o. Monitor blood work periodically- mainly renal function.  Hyperlipidemia- ldl at goal, continue diet monitoring for now, will not start her on any medication for now

## 2013-01-23 LAB — BASIC METABOLIC PANEL
BUN/Creatinine Ratio: 29 — ABNORMAL HIGH (ref 11–26)
CO2: 22 mmol/L (ref 18–29)
Creatinine, Ser: 1.35 mg/dL — ABNORMAL HIGH (ref 0.57–1.00)
GFR calc non Af Amer: 34 mL/min/{1.73_m2} — ABNORMAL LOW (ref >59)
Potassium: 3.9 mmol/L (ref 3.5–5.2)
Sodium: 144 mmol/L (ref 134–144)

## 2013-02-14 ENCOUNTER — Telehealth: Payer: Self-pay

## 2013-02-14 NOTE — Telephone Encounter (Signed)
aricept wont help bring her meory back but can prevent it from worsening. i will recommend introducing another medication namenda 10 mg twice a day until next appointment along with aricept and will discontinue them if side effect or further worsening of memory noted at that visit

## 2013-02-14 NOTE — Telephone Encounter (Signed)
Daughter Gaspar Cola called, wondering about stopping the Aricept. Mother not any better, in fact, her mother called her last night at midnight had gotten up and fixed her breakfast confused about the time.She has never done this before.  Not having any side-effects from the pill. But wonders about stopping it or what. Daughter doesn't live with [name] she thinks she is taking the medication, she talks with her each day, and her mother tells her she is.  [name], CMA

## 2013-02-19 MED ORDER — MEMANTINE HCL 10 MG PO TABS: 10.0000 mg | ORAL_TABLET | Freq: Two times a day (BID) | ORAL | Status: DC

## 2013-02-19 NOTE — Telephone Encounter (Signed)
Left message on VM for patient's daughter to return call when available

## 2013-02-19 NOTE — Telephone Encounter (Signed)
Discussed with patient's daughter, requested rx be sent to [facility]. Patient's daughter indicated that she will research medication prior to allowing her mother to take

## 2013-04-16 ENCOUNTER — Other Ambulatory Visit: Payer: Medicare Other

## 2013-04-16 DIAGNOSIS — I10 Essential (primary) hypertension: Secondary | ICD-10-CM

## 2013-04-16 DIAGNOSIS — N189 Chronic kidney disease, unspecified: Secondary | ICD-10-CM

## 2013-04-17 LAB — CBC WITH DIFFERENTIAL/PLATELET
Basophils Absolute: 0 10*3/uL (ref 0.0–0.2)
Basos: 0 %
Eos: 0 %
Eosinophils Absolute: 0 10*3/uL (ref 0.0–0.4)
Immature Grans (Abs): 0 10*3/uL (ref 0.0–0.1)
Lymphs: 18 %
MCH: 29.9 pg (ref 26.6–33.0)
Monocytes: 11 %
Neutrophils Relative %: 71 %
RBC: 3.98 x10E6/uL (ref 3.77–5.28)
RDW: 15 % (ref 12.3–15.4)
WBC: 12 10*3/uL — ABNORMAL HIGH (ref 3.4–10.8)

## 2013-04-17 LAB — COMPREHENSIVE METABOLIC PANEL
ALT: 9 IU/L (ref 0–32)
AST: 21 IU/L (ref 0–40)
Alkaline Phosphatase: 60 IU/L (ref 39–117)
BUN/Creatinine Ratio: 23 (ref 11–26)
CO2: 23 mmol/L (ref 18–29)
Calcium: 9.6 mg/dL (ref 8.6–10.2)
Chloride: 105 mmol/L (ref 97–108)
GFR calc non Af Amer: 44 mL/min/{1.73_m2} — ABNORMAL LOW (ref >59)
Potassium: 4.1 mmol/L (ref 3.5–5.2)
Sodium: 144 mmol/L (ref 134–144)

## 2013-04-22 ENCOUNTER — Ambulatory Visit (INDEPENDENT_AMBULATORY_CARE_PROVIDER_SITE_OTHER): Payer: Medicare Other | Admitting: Internal Medicine

## 2013-04-22 ENCOUNTER — Encounter: Payer: Self-pay | Admitting: Internal Medicine

## 2013-04-22 ENCOUNTER — Encounter: Payer: Self-pay | Admitting: *Deleted

## 2013-04-22 VITALS — BP 122/66 | HR 65 | Temp 97.4°F | Resp 14 | Wt 128.2 lb

## 2013-04-22 DIAGNOSIS — L03314 Cellulitis of groin: Secondary | ICD-10-CM

## 2013-04-22 DIAGNOSIS — F329 Major depressive disorder, single episode, unspecified: Secondary | ICD-10-CM | POA: Insufficient documentation

## 2013-04-22 DIAGNOSIS — F411 Generalized anxiety disorder: Secondary | ICD-10-CM | POA: Insufficient documentation

## 2013-04-22 DIAGNOSIS — F02818 Dementia in other diseases classified elsewhere, unspecified severity, with other behavioral disturbance: Secondary | ICD-10-CM | POA: Insufficient documentation

## 2013-04-22 DIAGNOSIS — R269 Unspecified abnormalities of gait and mobility: Secondary | ICD-10-CM | POA: Insufficient documentation

## 2013-04-22 DIAGNOSIS — F0281 Dementia in other diseases classified elsewhere with behavioral disturbance: Secondary | ICD-10-CM | POA: Insufficient documentation

## 2013-04-22 DIAGNOSIS — L02219 Cutaneous abscess of trunk, unspecified: Secondary | ICD-10-CM

## 2013-04-22 DIAGNOSIS — B3749 Other urogenital candidiasis: Secondary | ICD-10-CM

## 2013-04-22 DIAGNOSIS — K5900 Constipation, unspecified: Secondary | ICD-10-CM

## 2013-04-22 DIAGNOSIS — B372 Candidiasis of skin and nail: Secondary | ICD-10-CM

## 2013-04-22 DIAGNOSIS — J439 Emphysema, unspecified: Secondary | ICD-10-CM | POA: Insufficient documentation

## 2013-04-22 DIAGNOSIS — F0280 Dementia in other diseases classified elsewhere without behavioral disturbance: Secondary | ICD-10-CM

## 2013-04-22 DIAGNOSIS — F0150 Vascular dementia without behavioral disturbance: Secondary | ICD-10-CM

## 2013-04-22 DIAGNOSIS — Z23 Encounter for immunization: Secondary | ICD-10-CM

## 2013-04-22 DIAGNOSIS — R3981 Functional urinary incontinence: Secondary | ICD-10-CM

## 2013-04-22 DIAGNOSIS — R443 Hallucinations, unspecified: Secondary | ICD-10-CM | POA: Insufficient documentation

## 2013-04-22 MED ORDER — NYSTATIN-TRIAMCINOLONE 100000-0.1 UNIT/GM-% EX CREA: 1.0000 "application " | TOPICAL_CREAM | Freq: Two times a day (BID) | CUTANEOUS | Status: DC

## 2013-04-22 MED ORDER — CEPHALEXIN 500 MG PO CAPS: 500.0000 mg | ORAL_CAPSULE | Freq: Four times a day (QID) | ORAL | Status: DC

## 2013-04-22 MED ORDER — SOLIFENACIN SUCCINATE 5 MG PO TABS: 5.0000 mg | ORAL_TABLET | Freq: Every day | ORAL | Status: DC

## 2013-04-22 MED ORDER — TETANUS-DIPHTH-ACELL PERTUSSIS 5-2.5-18.5 LF-MCG/0.5 IM SUSP: 0.5000 mL | Freq: Once | INTRAMUSCULAR | Status: DC

## 2013-04-22 MED ORDER — FLUCONAZOLE 150 MG PO TABS: 150.0000 mg | ORAL_TABLET | Freq: Once | ORAL | Status: DC

## 2013-04-22 MED ORDER — DONEPEZIL HCL 10 MG PO TABS: 10.0000 mg | ORAL_TABLET | Freq: Every day | ORAL | Status: DC

## 2013-04-22 NOTE — Progress Notes (Signed)
Patient ID: [name], female   DOB: 30-Oct-1919, 79 y.o.   MRN: [id number]     Chief Complaint  Patient presents with  . Medical Managment of Chronic Issues    3 month f/u   No Known Allergies  Code status- full code  [id number] 79 y/o female patient here for her routine visit. Her daughter is present with her. She is using her walker and denies any fall. She has mild cognitive impairment and is on aricept. namenda was not picked up as daughter felt there was no further memory decline. Her blood pressure looks good this visit.     Refuses influenza and pneumococcal vaccine.   No recent behavioral changes Bowel movement has improved with her miralax On further ROS, patient mentions having some itching in her groin area.  Of note- She lives alone in a single level housing. Her daughter visits her 2-3 times a week. She is independent with her toileting, dressing and heating microwavable food. Her daughter cleans her house and does her grocery and shopping.  Review of Systems   Constitutional: Negative for fever, chills and diaphoresis.   HENT: Negative for hearing loss, ear pain and ear discharge.    Eyes: Negative for blurred vision, double vision and pain.   Respiratory: Negative for cough, shortness of breath, wheezing and stridor.    Cardiovascular: Negative for chest pain, edema and palpitations.   Gastrointestinal: Negative for heartburn, nausea, vomiting, abdominal pain, constipation and diarrhea. occassionally needs miralax Genitourinary: Positive for frequency. Negative for dysuria, urgency and hematuria.       Wears pull ups   Musculoskeletal: Positive for joint pain. Negative for back pain and falls.        Uses walker to get around.    Skin: Negative for rash.  has itching in groin area Neurological: Negative for dizziness, focal weakness, seizures, loss of consciousness, weakness and headaches.   Psychiatric/Behavioral: Positive for mild cognitive impairment. MMSE  29/30. Negative for depression and hallucinations. The patient does not have insomnia.    Past Medical History  Diagnosis Date  . Frequency of urination   . Incontinence   . Constipation   . Hypertension    Past Surgical History  Procedure Laterality Date  . Colonoscopy  1991   Current Outpatient Prescriptions on File Prior to Visit  Medication Sig Dispense Refill  . donepezil (ARICEPT) 10 MG tablet Take 1 tablet (10 mg total) by mouth at bedtime.  30 tablet  3  . Multiple Vitamin (MULTIVITAMIN) tablet Take 1 tablet by mouth daily.      . polyethylene glycol (MIRALAX / GLYCOLAX) packet Take 17 g by mouth daily.      . TDaP (BOOSTRIX) 5-2.5-18.5 LF-MCG/0.5 injection Inject 0.5 mLs into the muscle once.  0.5 mL  0   No current facility-administered medications on file prior to visit.    Physical exam BP 122/66  Pulse 65  Temp(Src) 97.4 F (36.3 C) (Oral)  Resp 14  Wt 128 lb 3.2 oz (58.151 kg)  SpO2 99%  Constitutional: She appears well-developed and well-nourished. No distress.   HENT:   Head: Normocephalic and atraumatic.   Nose: Nose normal.   Mouth/Throat: Oropharynx is clear and moist. No oropharyngeal exudate.   Eyes: Conjunctivae and EOM are normal. Pupils are equal, round, and reactive to light.   Neck: Normal range of motion. Neck supple. No JVD present. No tracheal deviation present. No thyromegaly present.   Cardiovascular: Normal rate, regular rhythm, normal heart sounds  and intact distal pulses.  Exam reveals no murmur, gallop and friction rub.   loss of hair on shin area. No edema Pulmonary/Chest: Effort normal and breath sounds normal. No stridor. No respiratory distress. She has no wheezes. She has no rales. She exhibits no tenderness.   Abdominal: Soft. Bowel sounds are normal. She exhibits no distension and no mass. There is no tenderness. There is no rebound and no guarding.  Musculoskeletal: Normal range of motion. She exhibits no tenderness. No  edema Unsteady gait, uses a walker  Lymphadenopathy:    She has no cervical adenopathy.  Neurological: She is alert. No cranial nerve deficit. Coordination normal.   Skin: Skin in her groin area and lower abdomen is red and macerated. Moisture noted. Skin breakdown noted, no ulcer or drainage. Lifting the abdominal fold reveals redness as well Psychiatric: She has a normal mood and affect. Her behavior is normal.   Labs- CBC    Component Value Date/Time   WBC 12.0* 04/16/2013 1447   WBC 13.2* 03/28/2008 0515   RBC 3.98 04/16/2013 1447   RBC 2.98* 03/28/2008 0515   HGB 11.9 04/16/2013 1447   HCT 34.5 04/16/2013 1447   PLT 238 03/28/2008 0515   MCV 87 04/16/2013 1447   MCH 29.9 04/16/2013 1447   MCHC 34.5 04/16/2013 1447   MCHC 33.3 03/28/2008 0515   RDW 15.0 04/16/2013 1447   RDW 17.3* 03/28/2008 0515   LYMPHSABS 2.2 04/16/2013 1447   LYMPHSABS 1.1 03/19/2008 1454   MONOABS 1.4* 03/19/2008 1454   EOSABS 0.0 04/16/2013 1447   EOSABS 0.0 03/19/2008 1454   BASOSABS 0.0 04/16/2013 1447   BASOSABS 0.0 03/19/2008 1454    CMP     Component Value Date/Time   NA 144 04/16/2013 1447   NA 147* 03/27/2008 0420   K 4.1 04/16/2013 1447   CL 105 04/16/2013 1447   CO2 23 04/16/2013 1447   GLUCOSE 91 04/16/2013 1447   GLUCOSE 113* 03/27/2008 0420   BUN 25 04/16/2013 1447   BUN 11 03/27/2008 0420   CREATININE 1.10* 04/16/2013 1447   CALCIUM 9.6 04/16/2013 1447   PROT 6.9 04/16/2013 1447   PROT 6.7 03/19/2008 1454   ALBUMIN 3.8 03/19/2008 1454   AST 21 04/16/2013 1447   ALT 9 04/16/2013 1447   ALKPHOS 60 04/16/2013 1447   BILITOT 0.6 04/16/2013 1447   GFRNONAA 44* 04/16/2013 1447   GFRAA 51* 04/16/2013 1447   Assessment/plan  1. Need for prophylactic vaccination with combined diphtheria-tetanus-pertussis (DTP) vaccine - Tdap (BOOSTRIX) 5-2.5-18.5 LF-MCG/0.5 injection; Inject 0.5 mLs into the muscle once.  Dispense: 0.5 mL; Refill: 0  2. Diaper candidiasis Her urinary  incontinence likely contributes to this with poor hygiene. Will provide fluconazole 150 mg x 1 and to clean and dry area and apply nystatin- triamcinolone cream bid for now. Reassess in a week. To notify if has drainage or pain in the area  3. Cellulitis of groin With elevated white count and current skin finding, will treat her with keflex 500 mg qid for a week. Encouraged to take yogurt on daily basis - CBC with Differential; Future  4. Urinary incontinence due to cognitive impairment Will have her started on vesicare to help with OAB and reassess Skin care Frequent change of pull ups once wet Daughter to help patient with skin care twice a day. Wants to hold off on home care nurse for now  5. Constipation Continue prn miralax  6. Mixed Alzheimer's and vascular dementia Continue donepezil  Reassess in a week or earlier if no improvement

## 2013-04-25 ENCOUNTER — Other Ambulatory Visit: Payer: Self-pay | Admitting: Internal Medicine

## 2013-04-25 ENCOUNTER — Encounter (HOSPITAL_COMMUNITY): Payer: Self-pay | Admitting: Emergency Medicine

## 2013-04-25 ENCOUNTER — Inpatient Hospital Stay (HOSPITAL_COMMUNITY)
Admission: EM | Admit: 2013-04-25 | Discharge: 2013-05-01 | DRG: 280 | Disposition: A | Discharge status: Skilled Nursing Facility | Payer: Medicare Other | Attending: Internal Medicine | Admitting: Internal Medicine

## 2013-04-25 ENCOUNTER — Emergency Department (HOSPITAL_COMMUNITY): Payer: Medicare Other

## 2013-04-25 DIAGNOSIS — G3184 Mild cognitive impairment, so stated: Secondary | ICD-10-CM | POA: Diagnosis present

## 2013-04-25 DIAGNOSIS — R062 Wheezing: Secondary | ICD-10-CM | POA: Diagnosis present

## 2013-04-25 DIAGNOSIS — R7989 Other specified abnormal findings of blood chemistry: Secondary | ICD-10-CM

## 2013-04-25 DIAGNOSIS — R0600 Dyspnea, unspecified: Secondary | ICD-10-CM

## 2013-04-25 DIAGNOSIS — F02818 Dementia in other diseases classified elsewhere, unspecified severity, with other behavioral disturbance: Secondary | ICD-10-CM

## 2013-04-25 DIAGNOSIS — I509 Heart failure, unspecified: Secondary | ICD-10-CM | POA: Diagnosis present

## 2013-04-25 DIAGNOSIS — N189 Chronic kidney disease, unspecified: Secondary | ICD-10-CM

## 2013-04-25 DIAGNOSIS — K5900 Constipation, unspecified: Secondary | ICD-10-CM

## 2013-04-25 DIAGNOSIS — F411 Generalized anxiety disorder: Secondary | ICD-10-CM | POA: Diagnosis present

## 2013-04-25 DIAGNOSIS — R4181 Age-related cognitive decline: Secondary | ICD-10-CM | POA: Diagnosis present

## 2013-04-25 DIAGNOSIS — Z7902 Long term (current) use of antithrombotics/antiplatelets: Secondary | ICD-10-CM

## 2013-04-25 DIAGNOSIS — I214 Non-ST elevation (NSTEMI) myocardial infarction: Principal | ICD-10-CM

## 2013-04-25 DIAGNOSIS — D649 Anemia, unspecified: Secondary | ICD-10-CM | POA: Diagnosis present

## 2013-04-25 DIAGNOSIS — J449 Chronic obstructive pulmonary disease, unspecified: Secondary | ICD-10-CM

## 2013-04-25 DIAGNOSIS — Z87891 Personal history of nicotine dependence: Secondary | ICD-10-CM

## 2013-04-25 DIAGNOSIS — F0390 Unspecified dementia without behavioral disturbance: Secondary | ICD-10-CM

## 2013-04-25 DIAGNOSIS — I129 Hypertensive chronic kidney disease with stage 1 through stage 4 chronic kidney disease, or unspecified chronic kidney disease: Secondary | ICD-10-CM | POA: Diagnosis present

## 2013-04-25 DIAGNOSIS — R0902 Hypoxemia: Secondary | ICD-10-CM | POA: Diagnosis present

## 2013-04-25 DIAGNOSIS — J4489 Other specified chronic obstructive pulmonary disease: Secondary | ICD-10-CM

## 2013-04-25 DIAGNOSIS — Z79899 Other long term (current) drug therapy: Secondary | ICD-10-CM

## 2013-04-25 DIAGNOSIS — I200 Unstable angina: Secondary | ICD-10-CM

## 2013-04-25 DIAGNOSIS — I249 Acute ischemic heart disease, unspecified: Secondary | ICD-10-CM | POA: Diagnosis present

## 2013-04-25 DIAGNOSIS — I5041 Acute combined systolic (congestive) and diastolic (congestive) heart failure: Secondary | ICD-10-CM

## 2013-04-25 DIAGNOSIS — F0281 Dementia in other diseases classified elsewhere with behavioral disturbance: Secondary | ICD-10-CM | POA: Diagnosis present

## 2013-04-25 DIAGNOSIS — I10 Essential (primary) hypertension: Secondary | ICD-10-CM

## 2013-04-25 DIAGNOSIS — M1990 Unspecified osteoarthritis, unspecified site: Secondary | ICD-10-CM | POA: Diagnosis present

## 2013-04-25 DIAGNOSIS — J441 Chronic obstructive pulmonary disease with (acute) exacerbation: Secondary | ICD-10-CM

## 2013-04-25 DIAGNOSIS — E785 Hyperlipidemia, unspecified: Secondary | ICD-10-CM

## 2013-04-25 HISTORY — DX: Unspecified dementia, unspecified severity, without behavioral disturbance, psychotic disturbance, mood disturbance, and anxiety: F03.90

## 2013-04-25 HISTORY — DX: Spinal stenosis, site unspecified: M48.00

## 2013-04-25 HISTORY — DX: Chronic obstructive pulmonary disease, unspecified: J44.9

## 2013-04-25 HISTORY — DX: Pneumonia, unspecified organism: J18.9

## 2013-04-25 LAB — COMPREHENSIVE METABOLIC PANEL
ALT: 11 U/L (ref 0–35)
AST: 27 U/L (ref 0–37)
Albumin: 3.2 g/dL — ABNORMAL LOW (ref 3.5–5.2)
Alkaline Phosphatase: 51 U/L (ref 39–117)
Calcium: 8.7 mg/dL (ref 8.4–10.5)
GFR calc Af Amer: 47 mL/min — ABNORMAL LOW (ref >90)
Sodium: 148 mEq/L — ABNORMAL HIGH (ref 135–145)
Total Protein: 6.9 g/dL (ref 6.0–8.3)

## 2013-04-25 LAB — CBC WITH DIFFERENTIAL/PLATELET
Basophils Absolute: 0 10*3/uL (ref 0.0–0.1)
Basophils Relative: 0 % (ref 0–1)
Eosinophils Absolute: 0 10*3/uL (ref 0.0–0.7)
Eosinophils Relative: 0 % (ref 0–5)
MCH: 30.3 pg (ref 26.0–34.0)
MCHC: 33.3 g/dL (ref 30.0–36.0)
MCV: 90.8 fL (ref 78.0–100.0)
Platelets: 210 10*3/uL (ref 150–400)
RDW: 15.5 % (ref 11.5–15.5)

## 2013-04-25 LAB — TROPONIN I: Troponin I: 0.78 ng/mL (ref <0.30)

## 2013-04-25 LAB — POCT I-STAT TROPONIN I

## 2013-04-25 MED ORDER — ALBUTEROL SULFATE (5 MG/ML) 0.5% IN NEBU
5.0000 mg | INHALATION_SOLUTION | Freq: Once | RESPIRATORY_TRACT | Status: AC
  Administered 2013-04-25: 5 mg via RESPIRATORY_TRACT
  Filled 2013-04-25 (×2): qty 1

## 2013-04-25 MED ORDER — IPRATROPIUM BROMIDE 0.02 % IN SOLN
0.5000 mg | Freq: Once | RESPIRATORY_TRACT | Status: AC
  Administered 2013-04-25: 0.5 mg via RESPIRATORY_TRACT
  Filled 2013-04-25 (×2): qty 2.5

## 2013-04-25 MED ORDER — METHYLPREDNISOLONE SODIUM SUCC 125 MG IJ SOLR
125.0000 mg | Freq: Once | INTRAMUSCULAR | Status: AC
  Administered 2013-04-25: 125 mg via INTRAVENOUS
  Filled 2013-04-25: qty 2

## 2013-04-25 MED ORDER — IPRATROPIUM BROMIDE 0.02 % IN SOLN: 0.5000 mg | Freq: Once | RESPIRATORY_TRACT | Status: DC

## 2013-04-25 MED ORDER — ALBUTEROL SULFATE (5 MG/ML) 0.5% IN NEBU: 5.0000 mg | INHALATION_SOLUTION | Freq: Once | RESPIRATORY_TRACT | Status: DC

## 2013-04-25 MED ORDER — ASPIRIN EC 325 MG PO TBEC
325.0000 mg | DELAYED_RELEASE_TABLET | Freq: Once | ORAL | Status: AC
  Administered 2013-04-25: 325 mg via ORAL
  Filled 2013-04-25: qty 1

## 2013-04-25 NOTE — ED Provider Notes (Signed)
CSN: [id number]     Arrival date & time 04/25/13  2115 History   None    Chief Complaint  Patient presents with  . Shortness of Breath  . Wheezing   (Consider location/radiation/quality/duration/timing/severity/associated sxs/prior Treatment) HPI  Chief complaint: Wheezing history provided by family at bedside  Patient is a 79 year old female past medical history significant for COPD and comes in today chief complaint wheezing. Patient's family reports she began wheezing yesterday Thanksgiving dinner. Is been present for approximately 24 hours constant, unchanged. No treatments tried. Patient has not been struggling for breath. Family reports she's not been complaining of feeling short of breath. However the wheezing is continued she is brought to the emergency department for evaluation. Family reports is mild severity wheezing. Present for one day. No aggravating or alleviating factors noted. Patient does not have inhalers or other treatments for COPD.  Past Medical History  Diagnosis Date  . Frequency of urination   . Incontinence   . Constipation   . Hypertension   . COPD (chronic obstructive pulmonary disease)   . Pneumonia   . Dementia   . Spinal stenosis    Past Surgical History  Procedure Laterality Date  . Colonoscopy  1991   Family History  Problem Relation Age of Onset  . Cancer Brother   . Cancer Son    History  Substance Use Topics  . Smoking status: Former Smoker    Quit date: 09/27/1978  . Smokeless tobacco: Not on file  . Alcohol Use: No   OB History   Grav Para Term Preterm Abortions TAB SAB Ect Mult Living                 Review of Systems  Constitutional: Negative for fatigue.  Respiratory: Positive for wheezing.   Cardiovascular: Negative for chest pain.  Gastrointestinal: Negative for abdominal pain.  Genitourinary: Negative for dysuria.  Musculoskeletal: Negative for back pain.  Skin: Negative for rash.  Neurological: Negative for  headaches.  Psychiatric/Behavioral: Negative for agitation.  All other systems reviewed and are negative.    Allergies  Review of patient's allergies indicates no known allergies.  Home Medications   Current Outpatient Rx  Name  Route  Sig  Dispense  Refill  . cephALEXin (KEFLEX) 500 MG capsule   Oral   Take 1 capsule (500 mg total) by mouth 4 (four) times daily.   28 capsule   0   . donepezil (ARICEPT) 10 MG tablet   Oral   Take 1 tablet (10 mg total) by mouth at bedtime.   30 tablet   6   . Multiple Vitamin (MULTIVITAMIN) tablet   Oral   Take 1 tablet by mouth daily.         Marland Kitchen nystatin-triamcinolone (MYCOLOG II) cream   Topical   Apply 1 application topically 2 (two) times daily.   30 g   0   . polyethylene glycol (MIRALAX / GLYCOLAX) packet   Oral   Take 17 g by mouth daily as needed for mild constipation.          . solifenacin (VESICARE) 5 MG tablet   Oral   Take 1 tablet (5 mg total) by mouth daily.   30 tablet   3    BP 163/79  Pulse 89  Temp(Src) 97.8 F (36.6 C) (Oral)  Resp 26  Ht 4\' 8"  (1.422 m)  Wt 120 lb (54.432 kg)  BMI 26.92 kg/m2  SpO2 98% Physical Exam  Nursing note and  vitals reviewed. Constitutional: She appears well-developed and well-nourished.  HENT:  Head: Normocephalic and atraumatic.  Eyes: EOM are normal. Pupils are equal, round, and reactive to light.  Neck: Normal range of motion.  Cardiovascular: Normal rate, regular rhythm and intact distal pulses.   Pulmonary/Chest: Effort normal. No respiratory distress.  Expiratory wheezes bilaterally. Patient does not appear in distress. Normal respiratory rate  Abdominal: Soft. She exhibits no distension. There is no tenderness.  Musculoskeletal: Normal range of motion. She exhibits no tenderness.  Neurological: She is alert. No cranial nerve deficit. She exhibits normal muscle tone. Coordination normal.  Skin: Skin is warm and dry.  Psychiatric: She has a normal mood and  affect.    ED Course  Procedures (including critical care time) Labs Review Labs Reviewed  CBC WITH DIFFERENTIAL - Abnormal; Notable for the following:    WBC 13.1 (*)    RBC 3.37 (*)    Hemoglobin 10.2 (*)    HCT 30.6 (*)    Neutrophils Relative % 78 (*)    Neutro Abs 10.2 (*)    Monocytes Absolute 1.1 (*)    All other components within normal limits  COMPREHENSIVE METABOLIC PANEL - Abnormal; Notable for the following:    Sodium 148 (*)    Glucose, Bld 112 (*)    Creatinine, Ser 1.15 (*)    Albumin 3.2 (*)    Total Bilirubin 0.2 (*)    GFR calc non Af Amer 40 (*)    GFR calc Af Amer 47 (*)    All other components within normal limits  TROPONIN I - Abnormal; Notable for the following:    Troponin I 0.78 (*)    All other components within normal limits  POCT I-STAT TROPONIN I - Abnormal; Notable for the following:    Troponin i, poc 0.60 (*)    All other components within normal limits   Imaging Review Dg Chest Portable 1 View  04/25/2013   CLINICAL DATA:  Shortness of breath and wheezing.  Former smoker.  EXAM: PORTABLE CHEST - 1 VIEW  COMPARISON:  03/24/2008  FINDINGS: Of the shallow inspiration. Borderline heart size and pulmonary vascularity, likely normal for technique. There are interstitial changes in the lung bases bilaterally which are more prominent than previous study. This could represent progressive fibrosis, chronic bronchitic change, or developing interstitial pneumonitis. No blunting of costophrenic angles. No pneumothorax. No focal consolidation. Calcified and tortuous aorta. Prominent degenerative changes in the shoulders.  IMPRESSION: Increasing interstitial changes in the lungs of nonspecific etiology. No evidence of airspace disease or consolidation.   Electronically Signed   By: Burman Nieves M.D.   On: 04/25/2013 21:54    EKG Interpretation   None       MDM   1. COPD exacerbation   2. Elevated troponin    79 year old female history COPD comes  in with likely COPD exacerbation. Family reports wheezing for approximately 24 hours. Subjective the patient does not appear short of breath and does not complain of any shortness. Arrival is hypoxic 88%. He is wheezing noted bilaterally. Patient was given albuterol and DuoNeb's. Also soluMedrol 125. Chest x-ray showed interstitial changes however no pneumonias or other serious pathology. Given the patient's age and comorbidities screening labs obtained. Significant for recent showed slight leukocytosis. CMP unremarkable. Point care troponin elevated 0.6. Formal troponin elevated 0.78. EKG with nonspecific T-wave changes. No overt signs of ischemia. Given COPD exacerbation pelvic bones patient will need cardiac workup. Medicine consult for admission as accepted  the patient. On reexam patient's wheezing improved. Oxygen saturations improved. Patient continues to deny any chest pain shortness breath. Remained stable upon transfer to floor.  Patient discussed with attending Dr. Jodi Mourning.      Bridgett Larsson, MD 04/25/13 [phone]

## 2013-04-25 NOTE — ED Notes (Addendum)
C/o sob, onset yesterday, worse today, taking an abx for leg infection, seen by Dr. Glade Lloyd at [facility]. Denies pain. H/o obtained from daughter and EPIC 2009 H&P/progress notes.

## 2013-04-25 NOTE — ED Notes (Signed)
Critical Troponin results reported to [facility]

## 2013-04-26 LAB — TROPONIN I
Troponin I: 1.2 ng/mL (ref <0.30)
Troponin I: 1.08 ng/mL (ref <0.30)
Troponin I: 0.63 ng/mL (ref <0.30)

## 2013-04-26 LAB — PROTIME-INR
INR: 1.15 (ref 0.00–1.49)
Prothrombin Time: 14.5 seconds (ref 11.6–15.2)

## 2013-04-26 LAB — TSH: TSH: 0.835 u[IU]/mL (ref 0.350–4.500)

## 2013-04-26 MED ORDER — METOPROLOL TARTRATE 25 MG PO TABS
25.0000 mg | ORAL_TABLET | Freq: Two times a day (BID) | ORAL | Status: DC
  Administered 2013-04-26 (×2): 25 mg via ORAL
  Filled 2013-04-26 (×4): qty 1

## 2013-04-26 MED ORDER — HEPARIN (PORCINE) IN NACL 100-0.45 UNIT/ML-% IJ SOLN
650.0000 [IU]/h | INTRAMUSCULAR | Status: DC
  Administered 2013-04-26: 650 [IU]/h via INTRAVENOUS
  Filled 2013-04-26: qty 250

## 2013-04-26 MED ORDER — SODIUM CHLORIDE 0.9 % IJ SOLN
3.0000 mL | Freq: Two times a day (BID) | INTRAMUSCULAR | Status: DC
  Administered 2013-04-26: 3 mL via INTRAVENOUS

## 2013-04-26 MED ORDER — MORPHINE SULFATE 2 MG/ML IJ SOLN: 1.0000 mg | INTRAMUSCULAR | Status: DC | PRN

## 2013-04-26 MED ORDER — CLOPIDOGREL BISULFATE 75 MG PO TABS
75.0000 mg | ORAL_TABLET | Freq: Every day | ORAL | Status: DC
  Administered 2013-04-26 – 2013-05-01 (×6): 75 mg via ORAL
  Filled 2013-04-26 (×7): qty 1

## 2013-04-26 MED ORDER — SODIUM CHLORIDE 0.9 % IJ SOLN: 3.0000 mL | INTRAMUSCULAR | Status: DC | PRN

## 2013-04-26 MED ORDER — NITROGLYCERIN 2 % TD OINT
1.0000 [in_us] | TOPICAL_OINTMENT | Freq: Three times a day (TID) | TRANSDERMAL | Status: DC
  Administered 2013-04-26: 1 [in_us] via TOPICAL
  Filled 2013-04-26: qty 30

## 2013-04-26 MED ORDER — DARIFENACIN HYDROBROMIDE ER 7.5 MG PO TB24
7.5000 mg | ORAL_TABLET | Freq: Every day | ORAL | Status: DC
  Administered 2013-04-26 – 2013-05-01 (×6): 7.5 mg via ORAL
  Filled 2013-04-26 (×6): qty 1

## 2013-04-26 MED ORDER — HEPARIN SODIUM (PORCINE) 5000 UNIT/ML IJ SOLN
5000.0000 [IU] | Freq: Two times a day (BID) | INTRAMUSCULAR | Status: DC
  Administered 2013-04-26 – 2013-05-01 (×11): 5000 [IU] via SUBCUTANEOUS
  Filled 2013-04-26 (×12): qty 1

## 2013-04-26 MED ORDER — SODIUM CHLORIDE 0.9 % IV SOLN: 250.0000 mL | INTRAVENOUS | Status: DC | PRN

## 2013-04-26 MED ORDER — SODIUM CHLORIDE 0.9 % IJ SOLN: 3.0000 mL | Freq: Two times a day (BID) | INTRAMUSCULAR | Status: DC

## 2013-04-26 MED ORDER — HEPARIN BOLUS VIA INFUSION
3000.0000 [IU] | Freq: Once | INTRAVENOUS | Status: AC
  Administered 2013-04-26: 3000 [IU] via INTRAVENOUS
  Filled 2013-04-26: qty 3000

## 2013-04-26 MED ORDER — ONDANSETRON HCL 4 MG PO TABS: 4.0000 mg | ORAL_TABLET | Freq: Four times a day (QID) | ORAL | Status: DC | PRN

## 2013-04-26 MED ORDER — ASPIRIN EC 81 MG PO TBEC
81.0000 mg | DELAYED_RELEASE_TABLET | Freq: Every day | ORAL | Status: DC
  Administered 2013-04-27 – 2013-05-01 (×5): 81 mg via ORAL
  Filled 2013-04-26 (×5): qty 1

## 2013-04-26 MED ORDER — DOCUSATE SODIUM 100 MG PO CAPS
100.0000 mg | ORAL_CAPSULE | Freq: Two times a day (BID) | ORAL | Status: DC
  Administered 2013-04-26 – 2013-05-01 (×11): 100 mg via ORAL
  Filled 2013-04-26 (×12): qty 1

## 2013-04-26 MED ORDER — ASPIRIN EC 325 MG PO TBEC
325.0000 mg | DELAYED_RELEASE_TABLET | Freq: Every day | ORAL | Status: DC
  Administered 2013-04-26: 325 mg via ORAL
  Filled 2013-04-26: qty 1

## 2013-04-26 MED ORDER — DONEPEZIL HCL 10 MG PO TABS
10.0000 mg | ORAL_TABLET | Freq: Every day | ORAL | Status: DC
Start: 2013-04-26 — End: 2013-05-01
  Administered 2013-04-26 – 2013-04-30 (×6): 10 mg via ORAL
  Filled 2013-04-26 (×7): qty 1

## 2013-04-26 MED ORDER — ISOSORBIDE MONONITRATE ER 30 MG PO TB24
30.0000 mg | ORAL_TABLET | Freq: Every day | ORAL | Status: DC
  Administered 2013-04-26 – 2013-05-01 (×6): 30 mg via ORAL
  Filled 2013-04-26 (×6): qty 1

## 2013-04-26 MED ORDER — ATORVASTATIN CALCIUM 40 MG PO TABS
40.0000 mg | ORAL_TABLET | Freq: Every day | ORAL | Status: DC
  Filled 2013-04-26: qty 1

## 2013-04-26 MED ORDER — ATORVASTATIN CALCIUM 10 MG PO TABS
10.0000 mg | ORAL_TABLET | Freq: Every day | ORAL | Status: DC
  Administered 2013-04-26 – 2013-04-30 (×5): 10 mg via ORAL
  Filled 2013-04-26 (×6): qty 1

## 2013-04-26 MED ORDER — ONDANSETRON HCL 4 MG/2ML IJ SOLN: 4.0000 mg | Freq: Four times a day (QID) | INTRAMUSCULAR | Status: DC | PRN

## 2013-04-26 NOTE — Progress Notes (Signed)
ANTICOAGULATION CONSULT NOTE - Initial Consult  Pharmacy Consult for Heparin  Indication: chest pain/ACS  No Known Allergies  Patient Measurements: Height: 4\' 8"  (142.2 cm) Weight: 120 lb (54.432 kg) IBW/kg (Calculated) : 36.3  Vital Signs: Temp: 97.8 F (36.6 C) (11/28 2124) Temp src: Oral (11/28 2124) BP: 173/81 mmHg (11/28 2330) Pulse Rate: 91 (11/28 2330)  Labs:  Recent Labs  04/25/13 2130 04/25/13 2156  HGB 10.2*  --   HCT 30.6*  --   PLT 210  --   CREATININE 1.15*  --   TROPONINI  --  0.78*   Estimated Creatinine Clearance: 21.9 ml/min (by C-G formula based on Cr of 1.15).  Medical History: Past Medical History  Diagnosis Date  . Frequency of urination   . Incontinence   . Constipation   . Hypertension   . COPD (chronic obstructive pulmonary disease)   . Pneumonia   . Dementia   . Spinal stenosis    Assessment: 79 y/o F to start heparin for +troponin. No anticoagulants PTA. Other labs as above. Will get baseline INR with first HL.   Goal of Therapy:  Heparin level 0.3-0.7 units/ml Monitor platelets by anticoagulation protocol: Yes   Plan:  -Heparin 3000 units BOLUS x 1 -Start heparin drip at 650 units/hr -8 hour HL at 0900 -Daily CBC/HL -Monitor for bleeding -F/U MD plans  Thank you for allowing me to take part in this patient's care,  Abran Duke, PharmD Clinical Pharmacist Phone: [phone] Pager: [phone] 04/26/2013 12:32 AM

## 2013-04-26 NOTE — H&P (Signed)
[facility] History and Physical  [name] ZOX:[id number] DOB: 13-Jun-1919    PCP:   Oneal Grout, MD   Chief Complaint: shortness of breath for 2 days.  HPI: [name] is an 79 y.o. female with hx of COPD, mild dementia, spinal stenosis, HTN, lives at home alone, visited family for Thanksgiving dinner, was noted to be a little short of breath.  She denied any coughs, fever or chills.  She was noted to be wheezing and had tachypnea.  Family brought her to the ER via POV.  She generally has no problems and gets around well.  Evalaution in the ER included an EKG, which showed NSR, and non specific ST-T changes.  Her CXR showed no infiltrates.  Her troponin, however, was elevated at 0.68, then 0.78 with Cr of 1.1.  She was given nebulizer treatment, with slight improvement.  Hospitalist was asked to admit her for COPD exacerbation.  There were never any complaint of chest pain.  Rewiew of Systems:  Constitutional: Negative for malaise, fever and chills. No significant weight loss or weight gain Eyes: Negative for eye pain, redness and discharge, diplopia, visual changes, or flashes of light. ENMT: Negative for ear pain, hoarseness, nasal congestion, sinus pressure and sore throat. No headaches; tinnitus, drooling, or problem swallowing. Cardiovascular: Negative for chest pain, palpitations, diaphoresis, and peripheral edema. ; No orthopnea, PND Respiratory: Negative for cough, hemoptysis, wheezing and stridor. No pleuritic chestpain. Gastrointestinal: Negative for nausea, vomiting, diarrhea, constipation, abdominal pain, melena, blood in stool, hematemesis, jaundice and rectal bleeding.    Genitourinary: Negative for frequency, dysuria, incontinence,flank pain and hematuria; Musculoskeletal: Negative for back pain and neck pain. Negative for swelling and trauma.;  Skin: . Negative for pruritus, rash, abrasions, bruising and skin lesion.; ulcerations Neuro: Negative for  headache, lightheadedness and neck stiffness. Negative for weakness, altered level of consciousness , altered mental status, extremity weakness, burning feet, involuntary movement, seizure and syncope.  Psych: negative for anxiety, depression, insomnia, tearfulness, panic attacks, hallucinations, paranoia, suicidal or homicidal ideation.   Past Medical History  Diagnosis Date  . Frequency of urination   . Incontinence   . Constipation   . Hypertension   . COPD (chronic obstructive pulmonary disease)   . Pneumonia   . Dementia   . Spinal stenosis     Past Surgical History  Procedure Laterality Date  . Colonoscopy  1991    Medications:  HOME MEDS: Prior to Admission medications   Medication Sig Start Date End Date Taking? Authorizing Provider  cephALEXin (KEFLEX) 500 MG capsule Take 1 capsule (500 mg total) by mouth 4 (four) times daily. 04/22/13  Yes Mahima Pandey, MD  donepezil (ARICEPT) 10 MG tablet Take 1 tablet (10 mg total) by mouth at bedtime. 04/22/13  Yes Oneal Grout, MD  Multiple Vitamin (MULTIVITAMIN) tablet Take 1 tablet by mouth daily.   Yes Historical Provider, MD  nystatin-triamcinolone (MYCOLOG II) cream Apply 1 application topically 2 (two) times daily. 04/22/13  Yes Mahima Glade Lloyd, MD  polyethylene glycol (MIRALAX / GLYCOLAX) packet Take 17 g by mouth daily as needed for mild constipation.    Yes Historical Provider, MD  solifenacin (VESICARE) 5 MG tablet Take 1 tablet (5 mg total) by mouth daily. 04/22/13  Yes Mahima Glade Lloyd, MD     Allergies:  No Known Allergies  Social History:   reports that she quit smoking about 34 years ago. She does not have any smokeless tobacco history on file. She reports that she does not  drink alcohol or use illicit drugs.  Family History: Family History  Problem Relation Age of Onset  . Cancer Brother   . Cancer Son      Physical Exam: Filed Vitals:   04/25/13 2300 04/25/13 2330 04/25/13 2347 04/26/13 0105  BP: 173/85  173/81  174/88  Pulse: 88 91  92  Temp:    97.9 F (36.6 C)  TempSrc:    Oral  Resp: 21 23  20   Height:   4\' 8"  (1.422 m) 4\' 8"  (1.422 m)  Weight:   54.432 kg (120 lb) 57.743 kg (127 lb 4.8 oz)  SpO2: 100% 100%  100%   Blood pressure 174/88, pulse 92, temperature 97.9 F (36.6 C), temperature source Oral, resp. rate 20, height 4\' 8"  (1.422 m), weight 57.743 kg (127 lb 4.8 oz), SpO2 100.00%.  GEN:  Pleasant  patient lying in the stretcher in no acute distress; cooperative with exam. PSYCH:  alert and oriented x4; does not appear anxious or depressed; affect is appropriate. HEENT: Mucous membranes pink and anicteric; PERRLA; EOM intact; no cervical lymphadenopathy nor thyromegaly or carotid bruit; no JVD; There were no stridor. Neck is very supple. Breasts:: Not examined CHEST WALL: No tenderness CHEST: Normal respiration, clear to auscultation bilaterally.  HEART: Regular rate and rhythm.  There are no murmur, rub, or gallops.   BACK: No kyphosis or scoliosis; no CVA tenderness ABDOMEN: soft and non-tender; no masses, no organomegaly, normal abdominal bowel sounds; no pannus; no intertriginous candida. There is no rebound and no distention. Rectal Exam: Not done EXTREMITIES: No bone or joint deformity; age-appropriate arthropathy of the hands and knees; no edema; no ulcerations.  There is no calf tenderness. Genitalia: not examined PULSES: 2+ and symmetric SKIN: Normal hydration no rash or ulceration CNS: Cranial nerves 2-12 grossly intact no focal lateralizing neurologic deficit.  Speech is fluent; uvula elevated with phonation, facial symmetry and tongue midline. DTR are normal bilaterally, cerebella exam is intact, barbinski is negative and strengths are equaled bilaterally.  No sensory loss.   Labs on Admission:  Basic Metabolic Panel:  Recent Labs Lab 04/25/13 2130  NA 148*  K 3.8  CL 112  CO2 24  GLUCOSE 112*  BUN 20  CREATININE 1.15*  CALCIUM 8.7   Liver Function  Tests:  Recent Labs Lab 04/25/13 2130  AST 27  ALT 11  ALKPHOS 51  BILITOT 0.2*  PROT 6.9  ALBUMIN 3.2*   No results found for this basename: LIPASE, AMYLASE,  in the last 168 hours No results found for this basename: AMMONIA,  in the last 168 hours CBC:  Recent Labs Lab 04/25/13 2130  WBC 13.1*  NEUTROABS 10.2*  HGB 10.2*  HCT 30.6*  MCV 90.8  PLT 210   Cardiac Enzymes:  Recent Labs Lab 04/25/13 2156  TROPONINI 0.78*    CBG: No results found for this basename: GLUCAP,  in the last 168 hours   Radiological Exams on Admission: Dg Chest Portable 1 View  04/25/2013   CLINICAL DATA:  Shortness of breath and wheezing.  Former smoker.  EXAM: PORTABLE CHEST - 1 VIEW  COMPARISON:  03/24/2008  FINDINGS: Of the shallow inspiration. Borderline heart size and pulmonary vascularity, likely normal for technique. There are interstitial changes in the lung bases bilaterally which are more prominent than previous study. This could represent progressive fibrosis, chronic bronchitic change, or developing interstitial pneumonitis. No blunting of costophrenic angles. No pneumothorax. No focal consolidation. Calcified and tortuous aorta. Prominent degenerative changes  in the shoulders.  IMPRESSION: Increasing interstitial changes in the lungs of nonspecific etiology. No evidence of airspace disease or consolidation.   Electronically Signed   By: Burman Nieves M.D.   On: 04/25/2013 21:54    EKG: Independently reviewed. NSR with nonspecific ST T changes.   Assessment/Plan Present on Admission:  . ACS (acute coronary syndrome) . Dementia in conditions classified elsewhere with behavioral disturbance(294.11) . Hypertension . Anxiety state, unspecified . Wheezing  PLAN:  I suspect her shortness of breath is more of an ACS rather than pulmonary wheezes.  She also has elevated troponin already.  Will start her on IV Heparin, along with ASA and NTP.  Currently she is comfortable.  I will  continue neb PRN, but have stopped her steroids.  I spoke with Dr Lewis Moccasin and he will see her later today for further recommendation.  Her baseline is very functional as she has been able to live alone.  I have continued her home meds. Family thought she was ill after taking the Keflex, but I think it is not related.  I was able to confirm with her that in the case of cardiopulmonary arrest, she would like to be a full code.  We will comply with her wishes.  She is stable and will be admitted to telemetry under [facility] service.  Thank you for letting me participate in her care.    Other plans as per orders.  Code Status: FULL Unk Lightning, MD. [facility] Pager [phone] 7pm to 7am.  04/26/2013, 2:50 AM

## 2013-04-26 NOTE — Progress Notes (Signed)
Notified MD of pts troponin trending up.  Pt asymptomatic, not c/o of any pain.  Ordered stat EKG and to give metoprolol and lisinopril.  Will carry out MD orders and continue to monitor.

## 2013-04-26 NOTE — Progress Notes (Signed)
Utilization review completed. Narjis Mira, RN, BSN. 

## 2013-04-26 NOTE — Progress Notes (Signed)
ANTICOAGULATION CONSULT NOTE - Follow Up Consult  Pharmacy Consult for Heparin Indication: chest pain/ACS  No Known Allergies  Patient Measurements: Height: 4\' 8"  (142.2 cm) Weight: 127 lb 4.8 oz (57.743 kg) (scale B) IBW/kg (Calculated) : 36.3 Heparin Dosing Weight:   Vital Signs: Temp: 98.7 F (37.1 C) (11/29 0651) Temp src: Oral (11/29 0651) BP: 152/77 mmHg (11/29 0651) Pulse Rate: 76 (11/29 0651)  Labs:  Recent Labs  04/25/13 2130 04/25/13 2156 04/26/13 0500 04/26/13 0930  HGB 10.2*  --   --   --   HCT 30.6*  --   --   --   PLT 210  --   --   --   LABPROT  --   --   --  14.5  INR  --   --   --  1.15  HEPARINUNFRC  --   --   --  0.39  CREATININE 1.15*  --   --   --   TROPONINI  --  0.78* 1.20* 1.08*    Estimated Creatinine Clearance: 22.6 ml/min (by C-G formula based on Cr of 1.15).   Assessment: SOB x 2d 79 y/o F to start heparin for +troponin.   PMH: COPD, mild dementia, spinal stenosis, HTN, incontinence, constipation  Anticoagulation: r/o MI with elevated troponins. Heparin level 0.39. Baseline Hgb 10.2. Plts 210  ID: WBC elevated 13.1.  Cardiovascular 152/77, HR 76 on ASA 325mg , Lipitor 40, metoprolol, NTG oint. Awaiting cards consult.  Endocrinology: Glucose 112  Gastrointestinal / Nutrition: docusate  Neurology: Aricept  Nephrology: Scr 1.15 on Enablex  Pulmonary: COPD exac vs MI  Hematology / Oncology: baseline anemia  PTA Medication Issues: none  Goal of Therapy:  Heparin level 0.3-0.7 units/ml Monitor platelets by anticoagulation protocol: Yes   Plan:  Continue heparin at 650 units/hr Wil recheck heparin level to verify per protocol.   Kairee Kozma S. Merilynn Finland, PharmD, BCPS Clinical Staff Pharmacist Pager [phone]  Misty Stanley Stillinger 04/26/2013,11:41 AM

## 2013-04-26 NOTE — Consult Note (Addendum)
CARDIOLOGY CONSULT NOTE  Patient ID: AKILAH CURETON MRN: [id number] DOB/AGE: 1920-04-23 79 y.o.  Admit date: 04/25/2013 Referring Physician  [facility] Primary Physician:  Oneal Grout, MD Reason for Consultation  NSTEMI  HPI: Patient is a fairly active, independent 79 year old African American female with mild cognitive impairment, history of hypertension, COPD who is admitted to the hospital with wheezing that started on the day of Thanksgiving and brought in by her daughter to the emergency room. Patient received nebulizer therapy and her troponins were elevated and she was admitted to the hospital for treatment of shortness of breath and non-ST elevation myocardial infarction. I was consulted to see the patient for further management.  Patient states that she has chronic shortness of breath but she has never had any "active wheezing" and she notices first day on the Thanksgiving. She states that this was only mild to moderate in intensity however insistence of her daughter, presented to the emergency room. This morning she states that she is feeling well and is back to baseline.  She denies any chest pain, palpitations, dizziness, syncope. She does have chronic leg edema. She does not notice any significant difference chronic edema.  She denies any prior history of TIA or stroke. She denies any recent weight changes. She does walk independently with help of a walker although she does have mildly difficult gait but there is no history of any recent fall or trauma.  Past Medical History  Diagnosis Date  . Frequency of urination   . Incontinence   . Constipation   . Hypertension   . COPD (chronic obstructive pulmonary disease)   . Pneumonia   . Dementia   . Spinal stenosis      Past Surgical History  Procedure Laterality Date  . Colonoscopy  1991     Family History  Problem Relation Age of Onset  . Cancer Brother   . Cancer Son      Social History: History   Social History   . Marital Status: Widowed    Spouse Name: N/A    Number of Children: N/A  . Years of Education: N/A   Occupational History  . Not on file.   Social History Main Topics  . Smoking status: Former Smoker    Quit date: 09/27/1978  . Smokeless tobacco: Not on file  . Alcohol Use: No  . Drug Use: No  . Sexual Activity: Not on file   Other Topics Concern  . Not on file   Social History Narrative  . No narrative on file     Prescriptions prior to admission  Medication Sig Dispense Refill  . cephALEXin (KEFLEX) 500 MG capsule Take 1 capsule (500 mg total) by mouth 4 (four) times daily.  28 capsule  0  . donepezil (ARICEPT) 10 MG tablet Take 1 tablet (10 mg total) by mouth at bedtime.  30 tablet  6  . Multiple Vitamin (MULTIVITAMIN) tablet Take 1 tablet by mouth daily.      Marland Kitchen nystatin-triamcinolone (MYCOLOG II) cream Apply 1 application topically 2 (two) times daily.  30 g  0  . polyethylene glycol (MIRALAX / GLYCOLAX) packet Take 17 g by mouth daily as needed for mild constipation.       . solifenacin (VESICARE) 5 MG tablet Take 1 tablet (5 mg total) by mouth daily.  30 tablet  3    Scheduled Meds: . aspirin EC  325 mg Oral Daily  . atorvastatin  40 mg Oral q1800  . darifenacin  7.5 mg Oral Daily  . docusate sodium  100 mg Oral BID  . donepezil  10 mg Oral QHS  . metoprolol tartrate  25 mg Oral BID  . nitroGLYCERIN  1 inch Topical Q8H   Continuous Infusions: . heparin 650 Units/hr (04/26/13 0151)   PRN Meds:.morphine injection, ondansetron (ZOFRAN) IV, ondansetron  ROS: General: no fevers/chills/night sweats Eyes: no blurry vision, diplopia, or amaurosis ENT: no sore throat or hearing loss GI: no abdominal pain, nausea, vomiting, diarrhea, or constipation GU: no dysuria, frequency, or hematuria Neuro: no headache, numbness, tingling, or weakness of extremities Heme: no bleeding, DVT, or easy bruising Other systems negative   Physical Exam: Blood pressure 152/77,  pulse 76, temperature 98.7 F (37.1 C), temperature source Oral, resp. rate 20, height 4\' 8"  (1.422 m), weight 57.743 kg (127 lb 4.8 oz), SpO2 100.00%.   General appearance: alert, cooperative, appears stated age, no distress and mildly dyspneic Lungs: Bilateral diffuse expiratory wheezes, more prominent at the bases but also heard anteriorly. Heart: regular rate and rhythm, S1, S2 normal, no murmur, click, rub or gallop Extremities: edema 2 plus and Homans sign is negative, no sign of DVT Pulses: Carotid pulse normal, femoral pulse normal, popliteal pulse feeble, pedal pulses were trace bilaterally. No skin tear, no clinical evidence of acute arterial insufficiency. Neurologic: Grossly normal  Labs:   Lab Results  Component Value Date   WBC 13.1* 04/25/2013   HGB 10.2* 04/25/2013   HCT 30.6* 04/25/2013   MCV 90.8 04/25/2013   PLT 210 04/25/2013    Recent Labs Lab 04/25/13 2130  NA 148*  K 3.8  CL 112  CO2 24  BUN 20  CREATININE 1.15*  CALCIUM 8.7  PROT 6.9  BILITOT 0.2*  ALKPHOS 51  ALT 11  AST 27  GLUCOSE 112*   Lab Results  Component Value Date   TROPONINI 1.08* 04/26/2013    Lipid Panel     Component Value Date/Time   TRIG 71 12/24/2012 1127   HDL 63 12/24/2012 1127   CHOLHDL 3.0 12/24/2012 1127   LDLCALC 109* 12/24/2012 1127   Cardiac Panel (last 3 results)  Recent Labs  04/25/13 2156 04/26/13 0500 04/26/13 0930  TROPONINI 0.78* 1.20* 1.08*    EKG: 04/26/2013: Sinus rhythm with first degree AV block, left atrial abnormality, inferior infarct, probably old nonspecific diffuse T wave changes, cannot exclude inferior and anterior ischemia. No prior EKG to compare.   Radiology: Dg Chest Portable 1 View  04/25/2013   CLINICAL DATA:  Shortness of breath and wheezing.  Former smoker.  EXAM: PORTABLE CHEST - 1 VIEW  COMPARISON:  03/24/2008  FINDINGS: Of the shallow inspiration. Borderline heart size and pulmonary vascularity, likely normal for technique. There  are interstitial changes in the lung bases bilaterally which are more prominent than previous study. This could represent progressive fibrosis, chronic bronchitic change, or developing interstitial pneumonitis. No blunting of costophrenic angles. No pneumothorax. No focal consolidation. Calcified and tortuous aorta. Prominent degenerative changes in the shoulders.  IMPRESSION: Increasing interstitial changes in the lungs of nonspecific etiology. No evidence of airspace disease or consolidation.   Electronically Signed   By: Burman Nieves M.D.   On: 04/25/2013 21:54    ASSESSMENT AND PLAN:  Shortness of breath and dyspnea on exertion, probably related to CHF with non-ST elevation myocardial infarction, however underlying COPD with acute exacerbation in chronic bronchitis cannot be excluded. Physical examination with diffuse expiratory wheezes more consistent with COPD.  2. NSTEMI 3. Anemia,  hyperlipidemia- mild 4. Chronic renal insufficiency- mild. 5. Mild cognitive impairment 6. Degenerative joint disease.  Recommendation: Patient who is 79 years of age, frail, however lives independently and presently denies any chest pain and states that her shortness of breath is back to baseline. Her presentation is of mixed likely, myocardial infarction could certainly may have made her dyspnea worse with evidence of mild pulmonary edema which may be presenting as pulmonary fibrosis like  picture on chest x-ray, however she does have baseline chest x-ray abnormalities from previous. However at this point I would recommend conservative therapy unless she has significant discomfort then would consider invasive strategy. I did contact her daughter over  the telephone, she agrees that conservative therapy only, does not want any invasive strategy. I will discontinue heparin.  Continue beta blocker for now, I will add clopidogrel 75 mg by mouth daily, Imdur 30 mg by mouth daily and change lipitor to 10 mg daily  given her advanced age. I would also recommend bronchodilator therapy as she does have active wheezing.  Pamella Pert, MD 04/26/2013, 12:04 PM [facility] Pager: [phone] Office: [phone] If no answer Cell [phone]

## 2013-04-26 NOTE — Progress Notes (Signed)
[facility] PROGRESS NOTE  Assessment/Plan: *ACS (acute coronary syndrome): - ASA, beta-blocker, statin and heparin. - cont cycle cardiac markers, consulted cardiology Dr. Denyse Amass  Dementia in conditions classified elsewhere with behavioral disturbance(294.11) - Cont. Enabelex.   Hypertension: - mildly high    Code Status: full Family Communication: none  Disposition Plan: inapteint   Consultants:  cardiology  Procedures:  Echo pending  Antibiotics:  None  HPI/Subjective: She only feels weak.  Objective: Filed Vitals:   04/25/13 2330 04/25/13 2347 04/26/13 0105 04/26/13 0651  BP: 173/81  174/88 152/77  Pulse: 91  92 76  Temp:   97.9 F (36.6 C) 98.7 F (37.1 C)  TempSrc:   Oral Oral  Resp: 23  20 20   Height:  4\' 8"  (1.422 m) 4\' 8"  (1.422 m)   Weight:  54.432 kg (120 lb) 57.743 kg (127 lb 4.8 oz)   SpO2: 100%  100% 100%    Intake/Output Summary (Last 24 hours) at 04/26/13 0956 Last data filed at 04/26/13 0833  Gross per 24 hour  Intake  33.48 ml  Output      0 ml  Net  33.48 ml   Filed Weights   04/25/13 2347 04/26/13 0105  Weight: 54.432 kg (120 lb) 57.743 kg (127 lb 4.8 oz)    Exam:  General: Alert, awake, oriented x3, in no acute distress.  HEENT: No bruits, no goiter. + JVD Heart: Regular rate and rhythm, without murmurs, rubs, gallops.  Lungs: Good air movement, clear to auscultation. Abdomen: Soft, nontender, nondistended, positive bowel sounds.     Data Reviewed: Basic Metabolic Panel:  Recent Labs Lab 04/25/13 2130  NA 148*  K 3.8  CL 112  CO2 24  GLUCOSE 112*  BUN 20  CREATININE 1.15*  CALCIUM 8.7   Liver Function Tests:  Recent Labs Lab 04/25/13 2130  AST 27  ALT 11  ALKPHOS 51  BILITOT 0.2*  PROT 6.9  ALBUMIN 3.2*   No results found for this basename: LIPASE, AMYLASE,  in the last 168 hours No results found for this basename: AMMONIA,  in the last 168 hours CBC:  Recent Labs Lab 04/25/13 2130   WBC 13.1*  NEUTROABS 10.2*  HGB 10.2*  HCT 30.6*  MCV 90.8  PLT 210   Cardiac Enzymes:  Recent Labs Lab 04/25/13 2156 04/26/13 0500  TROPONINI 0.78* 1.20*   BNP (last 3 results) No results found for this basename: PROBNP,  in the last 8760 hours CBG: No results found for this basename: GLUCAP,  in the last 168 hours  No results found for this or any previous visit (from the past 240 hour(s)).   Studies: Dg Chest Portable 1 View  04/25/2013   CLINICAL DATA:  Shortness of breath and wheezing.  Former smoker.  EXAM: PORTABLE CHEST - 1 VIEW  COMPARISON:  03/24/2008  FINDINGS: Of the shallow inspiration. Borderline heart size and pulmonary vascularity, likely normal for technique. There are interstitial changes in the lung bases bilaterally which are more prominent than previous study. This could represent progressive fibrosis, chronic bronchitic change, or developing interstitial pneumonitis. No blunting of costophrenic angles. No pneumothorax. No focal consolidation. Calcified and tortuous aorta. Prominent degenerative changes in the shoulders.  IMPRESSION: Increasing interstitial changes in the lungs of nonspecific etiology. No evidence of airspace disease or consolidation.   Electronically Signed   By: Burman Nieves M.D.   On: 04/25/2013 21:54    Scheduled Meds: . aspirin EC  325 mg Oral Daily  .  atorvastatin  40 mg Oral q1800  . darifenacin  7.5 mg Oral Daily  . docusate sodium  100 mg Oral BID  . donepezil  10 mg Oral QHS  . metoprolol tartrate  25 mg Oral BID  . nitroGLYCERIN  1 inch Topical Q8H   Continuous Infusions: . heparin 650 Units/hr (04/26/13 0151)     Marinda Elk  [facility] Pager [phone]. If 8PM-8AM, please contact night-coverage at [email or web address], password [facility] 04/26/2013, 9:56 AM  LOS: 1 day

## 2013-04-26 NOTE — ED Provider Notes (Signed)
Medical screening examination/treatment/procedure(s) were conducted as a shared visit with non-physician practitioner(s) or resident and myself. I personally evaluated the patient during the encounter and agree with the findings and plan unless otherwise indicated.  I have personally reviewed any xrays and/ or EKG's with the provider and I agree with interpretation.  COPD hx and dementia. Worsening breathing for two days, recent abx for cellulitis. Clinically increased respiratory effort, exp wheeze bilateral, mild tachypnea, abd soft/ NT, mild tachycardia. Clinically COPD exac, troponin elevated, asa given, will trend troponin. EKG no acute findings. Plan for tele admission. Steroids and nebs in ED. Repeat nebs. NSTEMI Admitted to tele  EKG not in MUSE.  Date: 04/25/2013  Rate: 95  Rhythm: normal sinus rhythm  QRS Axis: right  Intervals: QT prolonged  ST/T Wave abnormalities: nonspecific ST changes  Conduction Disutrbances:none  Narrative Interpretation:    Enid Skeens, MD 04/26/13 0006

## 2013-04-27 ENCOUNTER — Inpatient Hospital Stay (HOSPITAL_COMMUNITY): Payer: Medicare Other

## 2013-04-27 DIAGNOSIS — I10 Essential (primary) hypertension: Secondary | ICD-10-CM

## 2013-04-27 DIAGNOSIS — I214 Non-ST elevation (NSTEMI) myocardial infarction: Secondary | ICD-10-CM

## 2013-04-27 MED ORDER — ALBUTEROL SULFATE (5 MG/ML) 0.5% IN NEBU
2.5000 mg | INHALATION_SOLUTION | RESPIRATORY_TRACT | Status: DC | PRN
  Administered 2013-04-27 (×2): 2.5 mg via RESPIRATORY_TRACT
  Filled 2013-04-27 (×2): qty 0.5

## 2013-04-27 MED ORDER — METOPROLOL TARTRATE 50 MG PO TABS
50.0000 mg | ORAL_TABLET | Freq: Two times a day (BID) | ORAL | Status: DC
  Administered 2013-04-27 – 2013-05-01 (×9): 50 mg via ORAL
  Filled 2013-04-27 (×10): qty 1

## 2013-04-27 MED ORDER — FUROSEMIDE 10 MG/ML IJ SOLN
40.0000 mg | Freq: Once | INTRAMUSCULAR | Status: AC
  Administered 2013-04-27: 15:00:00 40 mg via INTRAVENOUS

## 2013-04-27 NOTE — Significant Event (Signed)
CRITICAL VALUE ALERT  Critical value received: Pro B Natriuretic 33197.0  Date of notification: 04/27/2013  Time of notification:1800  Critical value read back :yes  Nurse who received alert: TWOODS  MD notified (1st page):  DR Radonna Ricker  Time of first page: 1800  MD notified (2nd page):N/A  Time of second page:N/A  Responding MD:  DR Radonna Ricker  Time MD responded:  1800

## 2013-04-27 NOTE — Progress Notes (Signed)
PT Cancellation Note  Patient Details Name: [name] MRN: [id number] DOB: 03/09/1920   Cancelled Treatment:    Reason Eval/Treat Not Completed: Patient at procedure or test/unavailable   Moris Ratchford 04/27/2013, 4:02 PM

## 2013-04-27 NOTE — Progress Notes (Addendum)
[facility] PROGRESS NOTE  Assessment/Plan: *ACS (acute coronary syndrome): - ASA, increase beta-blocker as she is mildly tachycardic, statin, plavix and heparin dc'd. - Cardiac markers trending down. - Appreciate cardiology assistance.  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 Enabelex.   Hypertension: - mildly high    Code Status: full Family Communication: none  Disposition Plan: inapteint   Consultants:  cardiology  Procedures:  Echo pending  Antibiotics:  None  HPI/Subjective: She only feels weak.  Objective: Filed Vitals:   04/26/13 0651 04/26/13 1418 04/26/13 2029 04/27/13 0500  BP: 152/77 126/59 125/67 152/98  Pulse: 76 82 77 105  Temp: 98.7 F (37.1 C) 98.1 F (36.7 C) 97.9 F (36.6 C) 97.2 F (36.2 C)  TempSrc: Oral Oral Oral Oral  Resp: 20 20 18 18   Height:      Weight:    56.8 kg (125 lb 3.5 oz)  SpO2: 100% 99% 98% 98%    Intake/Output Summary (Last 24 hours) at 04/27/13 0854 Last data filed at 04/26/13 2000  Gross per 24 hour  Intake    240 ml  Output      0 ml  Net    240 ml   Filed Weights   04/25/13 2347 04/26/13 0105 04/27/13 0500  Weight: 54.432 kg (120 lb) 57.743 kg (127 lb 4.8 oz) 56.8 kg (125 lb 3.5 oz)    Exam:  General: Alert, awake, oriented x3, in no acute distress.  HEENT: No bruits, no goiter. + JVD Heart: Regular rate and rhythm, without murmurs, rubs, gallops.  Lungs: Good air movement, clear to auscultation. Abdomen: Soft, nontender, nondistended, positive bowel sounds.     Data Reviewed: Basic Metabolic Panel:  Recent Labs Lab 04/25/13 2130  NA 148*  K 3.8  CL 112  CO2 24  GLUCOSE 112*  BUN 20  CREATININE 1.15*  CALCIUM 8.7   Liver Function Tests:  Recent Labs Lab 04/25/13 2130  AST 27  ALT 11  ALKPHOS 51  BILITOT 0.2*  PROT 6.9  ALBUMIN 3.2*   No results found for this basename: LIPASE, AMYLASE,  in the last 168 hours No results  found for this basename: AMMONIA,  in the last 168 hours CBC:  Recent Labs Lab 04/25/13 2130  WBC 13.1*  NEUTROABS 10.2*  HGB 10.2*  HCT 30.6*  MCV 90.8  PLT 210   Cardiac Enzymes:  Recent Labs Lab 04/25/13 2156 04/26/13 0500 04/26/13 0930 04/26/13 1435  TROPONINI 0.78* 1.20* 1.08* 0.63*   BNP (last 3 results) No results found for this basename: PROBNP,  in the last 8760 hours CBG: No results found for this basename: GLUCAP,  in the last 168 hours  No results found for this or any previous visit (from the past 240 hour(s)).   Studies: Dg Chest Portable 1 View  04/25/2013   CLINICAL DATA:  Shortness of breath and wheezing.  Former smoker.  EXAM: PORTABLE CHEST - 1 VIEW  COMPARISON:  03/24/2008  FINDINGS: Of the shallow inspiration. Borderline heart size and pulmonary vascularity, likely normal for technique. There are interstitial changes in the lung bases bilaterally which are more prominent than previous study. This could represent progressive fibrosis, chronic bronchitic change, or developing interstitial pneumonitis. No blunting of costophrenic angles. No pneumothorax. No focal consolidation. Calcified and tortuous aorta. Prominent degenerative changes in the shoulders.  IMPRESSION: Increasing interstitial changes in the lungs of nonspecific etiology. No evidence of airspace disease or consolidation.   Electronically Signed   By:  Burman Nieves M.D.   On: 04/25/2013 21:54    Scheduled Meds: . aspirin EC  81 mg Oral Daily  . atorvastatin  10 mg Oral q1800  . clopidogrel  75 mg Oral Q breakfast  . darifenacin  7.5 mg Oral Daily  . docusate sodium  100 mg Oral BID  . donepezil  10 mg Oral QHS  . heparin  5,000 Units Subcutaneous Q12H  . isosorbide mononitrate  30 mg Oral Daily  . metoprolol tartrate  25 mg Oral BID   Continuous Infusions:     Marinda Elk  [facility] Pager [phone]. If 8PM-8AM, please contact night-coverage at [email or web address],  password [facility] 04/27/2013, 8:54 AM  LOS: 2 days

## 2013-04-27 NOTE — Progress Notes (Signed)
Pt and family watched heart cath video 115 verbalized understanding.

## 2013-04-27 NOTE — Progress Notes (Addendum)
Subjective:  Patient complains of shortness of breath. No fever, no chest pain or chest tightness or palpitations.  Objective:  Vital Signs in the last 24 hours: Temp:  [97.2 F (36.2 C)-98.1 F (36.7 C)] 97.2 F (36.2 C) (11/30 0500) Pulse Rate:  [77-105] 105 (11/30 0500) Resp:  [18-20] 18 (11/30 0500) BP: (125-152)/(59-98) 152/98 mmHg (11/30 0500) SpO2:  [98 %-99 %] 98 % (11/30 0500) Weight:  [56.8 kg (125 lb 3.5 oz)] 56.8 kg (125 lb 3.5 oz) (11/30 0500)  Intake/Output from previous day: 11/29 0701 - 11/30 0700 In: 240 [P.O.:240] Out: -   Physical Exam: General appearance: alert, cooperative, appears stated age, no distress and mildly dyspneic  Lungs: Bilateral diffuse expiratory wheezes, more prominent at the bases but also heard anteriorly. Appears to occupy greater than 50% of the basis.  Heart: regular rate and rhythm, S1, S2 normal, no murmur, click, rub or gallop  Extremities: edema 2 plus and Homans sign is negative, no sign of DVT  Pulses: Carotid pulse normal, femoral pulse normal, popliteal pulse feeble, pedal pulses were trace bilaterally. No skin tear, no clinical evidence of acute arterial insufficiency.  Neurologic: Grossly normal   Lab Results:  Recent Labs  04/25/13 2130  WBC 13.1*  HGB 10.2*  PLT 210    Recent Labs  04/25/13 2130  NA 148*  K 3.8  CL 112  CO2 24  GLUCOSE 112*  BUN 20  CREATININE 1.15*    Recent Labs  04/26/13 0930 04/26/13 1435  TROPONINI 1.08* 0.63*   Hepatic Function Panel  Recent Labs  04/25/13 2130  PROT 6.9  ALBUMIN 3.2*  AST 27  ALT 11  ALKPHOS 51  BILITOT 0.2*   Lipid Panel     Component Value Date/Time   TRIG 71 12/24/2012 1127   HDL 63 12/24/2012 1127   CHOLHDL 3.0 12/24/2012 1127   LDLCALC 109* 12/24/2012 1127   Telemetry: No significant arrhythmias.  Assessment/Plan:  1. Shortness of breath and dyspnea on exertion, probably related to CHF with non-ST elevation myocardial infarction, however  underlying COPD with acute exacerbation in chronic bronchitis cannot be excluded. Physical examination with diffuse expiratory wheezes more consistent with COPD. Shortness of breath appears to have not improved significantly, bibasilar crackles appeared to have worsened since yesterday's examination. Clinical findings are confusing with either significant underlying CAD leading to pulmonary edema or underlying pulmonary pathology should be kept in mind including pulmonary embolism although appears to be unlikely. EKG: 04/26/2013: Sinus rhythm with first degree AV block, left atrial abnormality, inferior infarct, probably old nonspecific diffuse T wave changes, cannot exclude inferior and anterior ischemia. No prior EKG to compare.  2. NSTEMI  3. Hyperlipidemia- mild  4. Chronic renal insufficiency- mild.  5. Mild cognitive impairment  6. Degenerative joint disease.  Recommendation: I'm concerned about her shortness of breath, if chest x-ray demonstrates worsening congestive heart failure, I may consider coronary angiography in the morning as she may have left main disease or multivessel disease. She's also 79 years of age and family wishes need to be kept in mind, patient does not appear to clearly understand the complexities of angiography, I will certainly discuss with her daughter.  I will obtain chest x-ray PA and lateral view today,cardiac markers are trending down, will repeat EKG, follow up on echocardiogram in the morning. Check BNP. Addendum: I was able to get in touch with her daughter, explained to her regarding possible need for coronary angiography, explained to her regarding about a  1-2% risk of that, stroke, myocardial infarction, bleeding but not limited to these. She does not want CABG. She is willing to go with PCI and a coronary angiography but will further discuss with patient's granddaughter and make fibrillation. I will inform the R.N. Regarding this.  [name],  M.D. 04/27/2013, 1:12 PM [facility] Pager: [phone] Office: [phone] If no answer: [phone]    I have reviewed the results of the chest x-ray, the chest x-ray does appear to show probable pulmonary edema than infectious process clinically.  I have discussed extensively with the patient's daughter and granddaughter regarding the risks and benefits of coronary angiography and possible angioplasty and also medical therapy.  They were wondering whether we can do her therapy and outpatient settings, I explained to them that patient is sick enough that she should stay in-patient for further monitoring whether we do the procedure are not.  If they do decide to proceed with coronary angiography I'll set her up for possibly tomorrow depending on her pulmonary status.

## 2013-04-28 ENCOUNTER — Inpatient Hospital Stay (HOSPITAL_COMMUNITY): Payer: Medicare Other

## 2013-04-28 DIAGNOSIS — I5041 Acute combined systolic (congestive) and diastolic (congestive) heart failure: Secondary | ICD-10-CM

## 2013-04-28 DIAGNOSIS — I509 Heart failure, unspecified: Secondary | ICD-10-CM

## 2013-04-28 LAB — BASIC METABOLIC PANEL
BUN: 29 mg/dL — ABNORMAL HIGH (ref 6–23)
CO2: 27 mEq/L (ref 19–32)
Calcium: 8.7 mg/dL (ref 8.4–10.5)
Creatinine, Ser: 1.19 mg/dL — ABNORMAL HIGH (ref 0.50–1.10)
GFR calc non Af Amer: 39 mL/min — ABNORMAL LOW (ref >90)
Glucose, Bld: 93 mg/dL (ref 70–99)

## 2013-04-28 MED ORDER — FUROSEMIDE 10 MG/ML IJ SOLN
40.0000 mg | Freq: Two times a day (BID) | INTRAMUSCULAR | Status: DC
  Administered 2013-04-28 – 2013-04-29 (×2): 40 mg via INTRAVENOUS
  Filled 2013-04-28 (×4): qty 4

## 2013-04-28 MED ORDER — POTASSIUM CHLORIDE CRYS ER 20 MEQ PO TBCR
40.0000 meq | EXTENDED_RELEASE_TABLET | Freq: Two times a day (BID) | ORAL | Status: AC
  Administered 2013-04-28 (×2): 40 meq via ORAL
  Filled 2013-04-28 (×2): qty 2

## 2013-04-28 MED ORDER — FUROSEMIDE 10 MG/ML IJ SOLN
40.0000 mg | Freq: Once | INTRAMUSCULAR | Status: AC
  Administered 2013-04-28: 12:00:00 40 mg via INTRAVENOUS

## 2013-04-28 MED ORDER — FUROSEMIDE 10 MG/ML IJ SOLN: 40.0000 mg | Freq: Every day | INTRAMUSCULAR | Status: DC

## 2013-04-28 NOTE — Progress Notes (Signed)
Echo Lab  2D Echocardiogram completed.  [name], RDCS 04/28/2013 8:30 AM

## 2013-04-28 NOTE — Progress Notes (Signed)
Subjective:  Patient complains of shortness of breath. No further wheezing. No fever, no chest pain or chest tightness or palpitations.  Objective:  Vital Signs in the last 24 hours: Temp:  [97.2 F (36.2 C)-97.5 F (36.4 C)] 97.2 F (36.2 C) (12/01 0451) Pulse Rate:  [67-79] 67 (12/01 0451) Resp:  [19-20] 20 (12/01 0451) BP: (138-150)/(76-80) 139/77 mmHg (12/01 0451) SpO2:  [99 %-100 %] 99 % (12/01 0451) Weight:  [55.883 kg (123 lb 3.2 oz)] 55.883 kg (123 lb 3.2 oz) (12/01 0451)  Intake/Output from previous day: 11/30 0701 - 12/01 0700 In: 960 [P.O.:960] Out: 325 [Urine:325]  Physical Exam: General appearance: alert, cooperative, appears stated age, no distress and mildly dyspneic. JVD elevated.   Lungs: Bilateral diffuse expiratory wheezes, more prominent at the bases clear anteriorly. Appears to occupy greater than 50% of the basis. Appear coarse. Heart: regular rate and rhythm, S1, S2 normal, no murmur, click, rub or gallop  Extremities: edema resolved and Homans sign is negative, no sign of DVT  Pulses: Carotid pulse normal, femoral pulse normal, popliteal pulse feeble, pedal pulses were trace bilaterally. No skin tear, no clinical evidence of acute arterial insufficiency.  Neurologic: Grossly normal   Lab Results:  Recent Labs  04/25/13 2130  WBC 13.1*  HGB 10.2*  PLT 210    Recent Labs  04/25/13 2130 04/28/13 0100  NA 148* 142  K 3.8 3.4*  CL 112 107  CO2 24 27  GLUCOSE 112* 93  BUN 20 29*  CREATININE 1.15* 1.19*    Recent Labs  04/26/13 0930 04/26/13 1435  TROPONINI 1.08* 0.63*   Hepatic Function Panel  Recent Labs  04/25/13 2130  PROT 6.9  ALBUMIN 3.2*  AST 27  ALT 11  ALKPHOS 51  BILITOT 0.2*   Lipid Panel     Component Value Date/Time   TRIG 71 12/24/2012 1127   HDL 63 12/24/2012 1127   CHOLHDL 3.0 12/24/2012 1127   LDLCALC 109* 12/24/2012 1127   Telemetry: No significant arrhythmias.  Assessment/Plan:  1. Shortness of breath  probably related to acute on chronic diastolic CHF with non-ST elevation myocardial infarction, and underlying COPD and possible pulmonary fibrosis. Acute myocardial infarction may have tipped her over to acute onset of dyspnea.  Echocardiogram 04/28/2013: Mild to moderate decrease in LV systolic function, EF 40-45% with global hypokinesis and possibly distal inferoseptal, anterolateral and apical hypokinesis, wall motion abnormality could not be excluded. The IVC was markedly dilated with absent respiratory variation suggest elevated right ventricular and right-sided pressure.  Physical examination with diffuse expiratory wheezes, coarse crackles at the bases more consistent with COPD.   Clinical findings are confusing with either significant underlying CAD leading to pulmonary edema and underlying pulmonary pathology should be kept in mind including pulmonary embolism although appears to be unlikely.  EKG: 04/26/2013: Sinus rhythm with first degree AV block, left atrial abnormality, inferior infarct, probably old nonspecific diffuse T wave changes, cannot exclude inferior and anterior ischemia. No prior EKG to compare.  2. NSTEMI  3. Hyperlipidemia- mild  4. Chronic renal insufficiency- mild.  5. Mild cognitive impairment  6. Degenerative joint disease.  Recommendation: D/W Dr. Radonna Ricker, we will try one more dose of furosemide and repeat chest x-ray this afternoon to see if this is related to pulmonary edema and if her lung findings do not change, we will consider either a CT angiogram of the chest to evaluate for pulmonary embolism or consider a pain high-resolution CT to evaluate for pulmonary process.  Yesterday and a long discussion with the family, they want conservative therapy only for now. No plan on coronary angiography at this time. I will hold off on the ACE inhibitors or ARB for now as patient is getting dialysis and has mild underlying chronic renal insufficiency. Also we plan on  probably obtaining a CT angiogram the morning versus pain high-resolution CT.   Pamella Pert, M.D. 04/28/2013, 9:59 AM [facility] Pager: [phone] Office: [phone] If no answer: [phone]

## 2013-04-28 NOTE — Progress Notes (Signed)
[facility] PROGRESS NOTE  Assessment/Plan: *ACS (acute coronary syndrome): - ASA, increase beta-blocker as she is mildly tachycardic, statin,  On Plavix. - Cardiac markers trending down. - Appreciate cardiology assistance.  Acute Systolic and diastolic heart failure: - Cont lasix and repeat CXR in afternoon. If no improvement will check Ct chest to rule out pulmonary pathology. - She had crackles bilaterally and + JVD, respiration seems to be improved. - Echo showed EF 45% with diastolic heart failure.  - Confusing pictures, ? If heart failure caused increase in cardiac markers vs ACS caused HF vs a primary pulmonary pathology contributing to the picture. - Replete K and recheck b-met.   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 Enabelex.   Hypertension: - mildly high    Code Status: full Family Communication: none  Disposition Plan: inapteint   Consultants:  cardiology  Procedures:  Echo pending  Antibiotics:  None  HPI/Subjective: Cont to feel week. Relates her SOB is mildly improved.  Objective: Filed Vitals:   04/27/13 0500 04/27/13 1431 04/27/13 2042 04/28/13 0451  BP: 152/98 138/80 150/76 139/77  Pulse: 105 78 79 67  Temp: 97.2 F (36.2 C) 97.3 F (36.3 C) 97.5 F (36.4 C) 97.2 F (36.2 C)  TempSrc: Oral Oral Oral Oral  Resp: 18 19 20 20   Height:      Weight: 56.8 kg (125 lb 3.5 oz)   55.883 kg (123 lb 3.2 oz)  SpO2: 98% 100% 99% 99%    Intake/Output Summary (Last 24 hours) at 04/28/13 1004 Last data filed at 04/28/13 0921  Gross per 24 hour  Intake   1200 ml  Output    525 ml  Net    675 ml   Filed Weights   04/26/13 0105 04/27/13 0500 04/28/13 0451  Weight: 57.743 kg (127 lb 4.8 oz) 56.8 kg (125 lb 3.5 oz) 55.883 kg (123 lb 3.2 oz)    Exam:  General: Alert, awake, oriented x3, in no acute distress.  HEENT: No bruits, no goiter. + JVD Heart: Regular rate and rhythm, without  murmurs, rubs, gallops.  Lungs: Good air movement, crackles at bases. Abdomen: Soft, nontender, nondistended, positive bowel sounds.     Data Reviewed: Basic Metabolic Panel:  Recent Labs Lab 04/25/13 2130 04/28/13 0100  NA 148* 142  K 3.8 3.4*  CL 112 107  CO2 24 27  GLUCOSE 112* 93  BUN 20 29*  CREATININE 1.15* 1.19*  CALCIUM 8.7 8.7   Liver Function Tests:  Recent Labs Lab 04/25/13 2130  AST 27  ALT 11  ALKPHOS 51  BILITOT 0.2*  PROT 6.9  ALBUMIN 3.2*   No results found for this basename: LIPASE, AMYLASE,  in the last 168 hours No results found for this basename: AMMONIA,  in the last 168 hours CBC:  Recent Labs Lab 04/25/13 2130  WBC 13.1*  NEUTROABS 10.2*  HGB 10.2*  HCT 30.6*  MCV 90.8  PLT 210   Cardiac Enzymes:  Recent Labs Lab 04/25/13 2156 04/26/13 0500 04/26/13 0930 04/26/13 1435  TROPONINI 0.78* 1.20* 1.08* 0.63*   BNP (last 3 results)  Recent Labs  04/27/13 1559 04/28/13 0100  PROBNP 33197.0* 32708.0*   CBG: No results found for this basename: GLUCAP,  in the last 168 hours  No results found for this or any previous visit (from the past 240 hour(s)).   Studies: Dg Chest 2 View  04/27/2013   CLINICAL DATA:  79 year old female with shortness of breath, pulmonary  fibrosis, cough. Initial encounter.  EXAM: CHEST  2 VIEW  COMPARISON:  04/25/2013 and earlier.  FINDINGS: Semi upright AP and lateral views of the chest. Stable cardiomegaly and mediastinal contours. Hazy increased bilateral perihilar opacity greater than that at baseline, although Lung volumes are lower. No pneumothorax. Small pleural effusions suspected. No definite consolidation. Osteopenia.  IMPRESSION: Lower lung volumes with hazy bilateral perihilar opacity, which could reflect atelectasis, but interstitial edema or atypical infection not excluded. Small pleural effusions. Chronic cardiomegaly.   Electronically Signed   By: Augusto Gamble M.D.   On: 04/27/2013 16:59     Scheduled Meds: . aspirin EC  81 mg Oral Daily  . atorvastatin  10 mg Oral q1800  . clopidogrel  75 mg Oral Q breakfast  . darifenacin  7.5 mg Oral Daily  . docusate sodium  100 mg Oral BID  . donepezil  10 mg Oral QHS  . furosemide  40 mg Intravenous Once  . heparin  5,000 Units Subcutaneous Q12H  . isosorbide mononitrate  30 mg Oral Daily  . metoprolol tartrate  50 mg Oral BID   Continuous Infusions:     Marinda Elk  [facility] Pager [phone]. If 8PM-8AM, please contact night-coverage at [email or web address], password [facility] 04/28/2013, 10:04 AM  LOS: 3 days

## 2013-04-28 NOTE — Evaluation (Signed)
Physical Therapy Evaluation Patient Details Name: [name] MRN: [id number] DOB: 12/19/1919 Today's Date: 04/28/2013 Time: 8657-8469 PT Time Calculation (min): 22 min  PT Assessment / Plan / Recommendation History of Present Illness  [name] is an 79 y.o. female with hx of COPD, mild dementia, spinal stenosis, HTN, lives at home alone, visited family for Thanksgiving dinner, was noted to be a little short of breath.  She denied any coughs, fever or chills.  She was noted to be wheezing and had tachypnea.  Family brought her to the ER via POV.  She generally has no problems and gets around well; at ED, found to have elevated cardiac markers; now trending down  Clinical Impression  Pt admitted with above. Pt currently with functional limitations due to the deficits listed below (see PT Problem List).  Pt will benefit from skilled PT to increase their independence and safety with mobility to allow discharge to the venue listed below.       PT Assessment  Patient needs continued PT services    Follow Up Recommendations  Home health PT;Supervision - Intermittent (if slow progress must consider SNF)    Does the patient have the potential to tolerate intense rehabilitation      Barriers to Discharge Decreased caregiver support Must be modified independent to dc home; if slow progress, must consider SNF    Equipment Recommendations  3in1 (PT)    Recommendations for Other Services OT consult   Frequency Min 3X/week    Precautions / Restrictions Precautions Precautions: Fall   Pertinent Vitals/Pain no apparent distress       Mobility  Bed Mobility Bed Mobility: Supine to Sit;Sitting - Scoot to Edge of Bed Supine to Sit: 4: Min assist Sitting - Scoot to [place] of Bed: 4: Min assist Details for Bed Mobility Assistance: Min assist to initiate and folow through with getting to EOB; min physical assist to elevate trunk to sitting Transfers Transfers: Sit to Stand;Stand  to Sit Sit to Stand: 3: Mod assist;From bed Stand to Sit: 4: Min assist;To chair/3-in-1;With armrests Details for Transfer Assistance: cues for safety, hand placement and anterior translation of center of mass over feet for sit to stand; cues to control descent Ambulation/Gait Ambulation/Gait Assistance: 4: Min assist Ambulation Distance (Feet): 3 Feet Assistive device: Rolling walker Ambulation/Gait Assistance Details: Cues for stepping, needing some physical assist to maneuver RW; quite fatigued    Exercises     PT Diagnosis: Difficulty walking;Generalized weakness  PT Problem List: Decreased strength;Decreased activity tolerance;Decreased balance;Decreased mobility;Decreased coordination;Decreased cognition;Decreased knowledge of use of DME;Cardiopulmonary status limiting activity PT Treatment Interventions: DME instruction;Gait training;Stair training;Functional mobility training;Therapeutic activities;Therapeutic exercise;Balance training;Patient/family education     PT Goals(Current goals can be found in the care plan section) Acute Rehab PT Goals Patient Stated Goal: not stated PT Goal Formulation: With patient Time For Goal Achievement: 05/12/13 Potential to Achieve Goals: Good  Visit Information  Last PT Received On: 04/28/13 Assistance Needed: +1 History of Present Illness: [name] is an 79 y.o. female with hx of COPD, mild dementia, spinal stenosis, HTN, lives at home alone, visited family for Thanksgiving dinner, was noted to be a little short of breath.  She denied any coughs, fever or chills.  She was noted to be wheezing and had tachypnea.  Family brought her to the ER via POV.  She generally has no problems and gets around well; at ED, found to have elevated cardiac markers; now trending down  Prior Functioning  Home Living Family/patient expects to be discharged to:: Private residence Living Arrangements: Alone Available Help at Discharge:  Family;Available PRN/intermittently Type of Home: House Home Access: Stairs to enter [employer] of Steps: 3 Entrance Stairs-Rails: Right Home Layout: One level Home Equipment: Walker - 2 wheels Prior Function Level of Independence: Independent with assistive device(s) Communication Communication: No difficulties (though repeats "Oh my goodness" a lot)    Cognition  Cognition Arousal/Alertness: Awake/alert Behavior During Therapy: WFL for tasks assessed/performed Overall Cognitive Status: Within Functional Limits for tasks assessed (for simple mobility)    Extremity/Trunk Assessment Upper Extremity Assessment Upper Extremity Assessment: Defer to OT evaluation Lower Extremity Assessment Lower Extremity Assessment: Generalized weakness   Balance    End of Session PT - End of Session Activity Tolerance: Patient limited by fatigue Patient left: in chair;with call bell/phone within reach;with family/visitor present  GP     [facility] [place], [place] [phone]  04/28/2013, 4:39 PM

## 2013-04-29 ENCOUNTER — Other Ambulatory Visit: Payer: Medicare Other

## 2013-04-29 DIAGNOSIS — F0281 Dementia in other diseases classified elsewhere with behavioral disturbance: Secondary | ICD-10-CM

## 2013-04-29 LAB — BASIC METABOLIC PANEL
Calcium: 8.7 mg/dL (ref 8.4–10.5)
Chloride: 106 mEq/L (ref 96–112)
Creatinine, Ser: 1.22 mg/dL — ABNORMAL HIGH (ref 0.50–1.10)
GFR calc Af Amer: 44 mL/min — ABNORMAL LOW (ref >90)
Glucose, Bld: 81 mg/dL (ref 70–99)
Potassium: 4 mEq/L (ref 3.5–5.1)
Sodium: 145 mEq/L (ref 135–145)

## 2013-04-29 MED ORDER — POLYETHYLENE GLYCOL 3350 17 G PO PACK
17.0000 g | PACK | Freq: Every day | ORAL | Status: DC
  Administered 2013-04-29 – 2013-05-01 (×3): 17 g via ORAL
  Filled 2013-04-29 (×3): qty 1

## 2013-04-29 MED ORDER — FUROSEMIDE 20 MG PO TABS
20.0000 mg | ORAL_TABLET | Freq: Every day | ORAL | Status: DC
  Administered 2013-04-29 – 2013-05-01 (×3): 20 mg via ORAL
  Filled 2013-04-29 (×3): qty 1

## 2013-04-29 MED ORDER — LISINOPRIL 2.5 MG PO TABS
2.5000 mg | ORAL_TABLET | Freq: Every day | ORAL | Status: DC
  Administered 2013-04-29 – 2013-05-01 (×3): 2.5 mg via ORAL
  Filled 2013-04-29 (×3): qty 1

## 2013-04-29 NOTE — Progress Notes (Signed)
[facility] PROGRESS NOTE  Assessment/Plan: Acute Systolic and diastolic heart failure: - started initially on IV lasix, acute heart failure was most likely due to NSTEMI: - Cont lasix, orally. - She had crackles bilaterally and + JVD, respiration seems to be improved. Now JVD improved along with crackles. - Echo showed EF 45% with diastolic heart failure.   *ACS (acute coronary syndrome)/NSTEMI: - ASA, increase beta-blocker as she is mildly tachycardic, statin,  On Plavix. Treated conservatively with heparin, plavix and beta blockers. - Cardiac markers trending down. - Appreciate cardiology assistance.  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 Enabelex.   Hypertension: - Improved.    Code Status: full Family Communication: none  Disposition Plan: inapteint   Consultants:  cardiology  Procedures:  Echo pending  Antibiotics:  None  HPI/Subjective: SOB improved.  Objective: Filed Vitals:   04/28/13 1341 04/28/13 2204 04/29/13 0453 04/29/13 0946  BP: 137/62 124/79 104/71 110/57  Pulse: 82 74 80 70  Temp: 97.9 F (36.6 C) 97.6 F (36.4 C) 97.8 F (36.6 C)   TempSrc: Oral Oral Oral   Resp: 18 20 18    Height:      Weight:   54.976 kg (121 lb 3.2 oz)   SpO2: 96% 95% 94%     Intake/Output Summary (Last 24 hours) at 04/29/13 1218 Last data filed at 04/29/13 1023  Gross per 24 hour  Intake    480 ml  Output   3826 ml  Net  -3346 ml   Filed Weights   04/27/13 0500 04/28/13 0451 04/29/13 0453  Weight: 56.8 kg (125 lb 3.5 oz) 55.883 kg (123 lb 3.2 oz) 54.976 kg (121 lb 3.2 oz)    Exam:  General: Alert, awake, oriented x3, in no acute distress.  HEENT: No bruits, no goiter. JVD Heart: Regular rate and rhythm, without murmurs, rubs, gallops.  Lungs: Good air movement, clear to auscukltation Abdomen: Soft, nontender, nondistended, positive bowel sounds.     Data Reviewed: Basic Metabolic  Panel:  Recent Labs Lab 04/25/13 2130 04/28/13 0100 04/29/13 0530  NA 148* 142 145  K 3.8 3.4* 4.0  CL 112 107 106  CO2 24 27 26   GLUCOSE 112* 93 81  BUN 20 29* 30*  CREATININE 1.15* 1.19* 1.22*  CALCIUM 8.7 8.7 8.7   Liver Function Tests:  Recent Labs Lab 04/25/13 2130  AST 27  ALT 11  ALKPHOS 51  BILITOT 0.2*  PROT 6.9  ALBUMIN 3.2*   No results found for this basename: LIPASE, AMYLASE,  in the last 168 hours No results found for this basename: AMMONIA,  in the last 168 hours CBC:  Recent Labs Lab 04/25/13 2130  WBC 13.1*  NEUTROABS 10.2*  HGB 10.2*  HCT 30.6*  MCV 90.8  PLT 210   Cardiac Enzymes:  Recent Labs Lab 04/25/13 2156 04/26/13 0500 04/26/13 0930 04/26/13 1435  TROPONINI 0.78* 1.20* 1.08* 0.63*   BNP (last 3 results)  Recent Labs  04/27/13 1559 04/28/13 0100  PROBNP 33197.0* 32708.0*   CBG: No results found for this basename: GLUCAP,  in the last 168 hours  No results found for this or any previous visit (from the past 240 hour(s)).   Studies: Dg Chest 2 View  04/28/2013   CLINICAL DATA:  Pulmonary edema  EXAM: CHEST  2 VIEW  COMPARISON:  04/27/2013.  FINDINGS: Is bilateral mild interstitial thickening patchy alveolar airspace opacities. There is a trace right pleural effusion. There is no left pleural effusion.  Stable cardiomediastinal contours . The osseous structures are unremarkable.  IMPRESSION: Bilateral interstitial alveolar airspace opacities as can be seen with mild pulmonary edema versus atypical infection   Electronically Signed   By: Elige Ko   On: 04/28/2013 12:43   Dg Chest 2 View  04/27/2013   CLINICAL DATA:  79 year old female with shortness of breath, pulmonary fibrosis, cough. Initial encounter.  EXAM: CHEST  2 VIEW  COMPARISON:  04/25/2013 and earlier.  FINDINGS: Semi upright AP and lateral views of the chest. Stable cardiomegaly and mediastinal contours. Hazy increased bilateral perihilar opacity greater than  that at baseline, although Lung volumes are lower. No pneumothorax. Small pleural effusions suspected. No definite consolidation. Osteopenia.  IMPRESSION: Lower lung volumes with hazy bilateral perihilar opacity, which could reflect atelectasis, but interstitial edema or atypical infection not excluded. Small pleural effusions. Chronic cardiomegaly.   Electronically Signed   By: Augusto Gamble M.D.   On: 04/27/2013 16:59    Scheduled Meds: . aspirin EC  81 mg Oral Daily  . atorvastatin  10 mg Oral q1800  . clopidogrel  75 mg Oral Q breakfast  . darifenacin  7.5 mg Oral Daily  . docusate sodium  100 mg Oral BID  . donepezil  10 mg Oral QHS  . furosemide  40 mg Intravenous Q12H  . heparin  5,000 Units Subcutaneous Q12H  . isosorbide mononitrate  30 mg Oral Daily  . lisinopril  2.5 mg Oral Daily  . metoprolol tartrate  50 mg Oral BID   Continuous Infusions:     Marinda Elk  [facility] Pager [phone]. If 8PM-8AM, please contact night-coverage at [email or web address], password [facility] 04/29/2013, 12:18 PM  LOS: 4 days

## 2013-04-29 NOTE — Progress Notes (Signed)
Physical Therapy Treatment Patient Details Name: [name] MRN: [id number] DOB: 06-29-1919 Today's Date: 04/29/2013 Time: 7829-5621 PT Time Calculation (min): 18 min  PT Assessment / Plan / Recommendation  History of Present Illness [name] is an 79 y.o. female with hx of COPD, mild dementia, spinal stenosis, HTN, lives at home alone, visited family for Thanksgiving dinner, was noted to be a little short of breath.  She denied any coughs, fever or chills.  She was noted to be wheezing and had tachypnea.  Family brought her to the ER via POV.  She generally has no problems and gets around well; at ED, found to have elevated cardiac markers; now trending down   PT Comments   Pt continues with significantly decreased activity tolerance and requires min A with mobility and gait.  Pt will benefit from SNF stay as she would be unable to manage at home at this time.  Follow Up Recommendations  SNF     Does the patient have the potential to tolerate intense rehabilitation     Barriers to Discharge        Equipment Recommendations  Other (comment) (TBD)    Recommendations for Other Services    Frequency Min 3X/week   Progress towards PT Goals    Plan Discharge plan needs to be updated    Precautions / Restrictions Precautions Precautions: Fall Restrictions Weight Bearing Restrictions: No   Pertinent Vitals/Pain No c/o pain    Mobility  Bed Mobility Supine to Sit: 4: Min assist Sitting - Scoot to Edge of Bed: 4: Min assist Details for Bed Mobility Assistance: pt requires increased time to intiate, cues for sequencing, min A for trunk Transfers Sit to Stand: 4: Min assist Stand to Sit: 4: Min assist Details for Transfer Assistance: cues for UE placement, increased time, steadying assist Ambulation/Gait Ambulation/Gait Assistance: 4: Min assist Ambulation Distance (Feet): 3 Feet Assistive device: Rolling walker Ambulation/Gait Assistance Details: pt states she  feels she is falling but is able to balance with close supervision/min A.  Pt with L lateral curve of trunk but able to correct balance.  Limited by fatigue    Exercises General Exercises - Lower Extremity Ankle Circles/Pumps: AROM;Both;10 reps Long Arc Quad: AROM;Both;10 reps Hip ABduction/ADduction: AROM;Both;10 reps Hip Flexion/Marching: AROM;Both;10 reps   PT Diagnosis:    PT Problem List:   PT Treatment Interventions:     PT Goals (current goals can now be found in the care plan section)    Visit Information  Last PT Received On: 04/29/13 Assistance Needed: +1 History of Present Illness: [name] is an 79 y.o. female with hx of COPD, mild dementia, spinal stenosis, HTN, lives at home alone, visited family for Thanksgiving dinner, was noted to be a little short of breath.  She denied any coughs, fever or chills.  She was noted to be wheezing and had tachypnea.  Family brought her to the ER via POV.  She generally has no problems and gets around well; at ED, found to have elevated cardiac markers; now trending down    Subjective Data      Cognition  Cognition Arousal/Alertness: Awake/alert Behavior During Therapy: WFL for tasks assessed/performed Overall Cognitive Status: Within Functional Limits for tasks assessed    Balance  Static Standing Balance Static Standing - Balance Support: Bilateral upper extremity supported Static Standing - Level of Assistance: 5: Stand by assistance Static Standing - Comment/# of Minutes: static standing balance with RW with cues for upright trunk,  pt states she feels she is falling but no LOB  End of Session PT - End of Session Equipment Utilized During Treatment: Gait belt Activity Tolerance: Patient limited by fatigue Patient left: in chair;with call bell/phone within reach   GP     [facility] 04/29/2013, 10:15 AM

## 2013-04-29 NOTE — Progress Notes (Addendum)
Subjective:  Patient complains of shortness of breath. No further wheezing. No fever, no chest pain or chest tightness or palpitations.  Objective:  Vital Signs in the last 24 hours: Temp:  [97.6 F (36.4 C)-97.9 F (36.6 C)] 97.8 F (36.6 C) (12/02 0453) Pulse Rate:  [74-82] 80 (12/02 0453) Resp:  [18-20] 18 (12/02 0453) BP: (104-137)/(62-79) 104/71 mmHg (12/02 0453) SpO2:  [94 %-100 %] 94 % (12/02 0453) Weight:  [54.976 kg (121 lb 3.2 oz)] 54.976 kg (121 lb 3.2 oz) (12/02 0453)  Intake/Output from previous day: 12/01 0701 - 12/02 0700 In: 600 [P.O.:600] Out: 2826 [Urine:2825; Stool:1]  Physical Exam: General appearance: alert, cooperative, appears stated age, no distress and mildly dyspneic. JVD elevated.   Lungs: Bilateral diffuse expiratory wheezes and coarse crackles more prominent at the bases clear anteriorly. Appears to occupy greater than 50% of the basis. Improved from yesterday Heart: regular rate and rhythm, S1, S2 normal, no murmur, click, rub or gallop  Abdominal exam: Mild diffuse tenderness is present, no obvious organomegaly, probable colon felt. Bowel sounds heard in all 4 quadrants. No rebound tenderness or guarding. Extremities: edema resolved and Homans sign is negative, no sign of DVT  Pulses: Carotid pulse normal, femoral pulse normal, popliteal pulse feeble, pedal pulses were trace bilaterally. No skin tear, no clinical evidence of acute arterial insufficiency.  Neurologic: Grossly normal   Lab Results: No results found for this basename: WBC, HGB, PLT,  in the last 72 hours  Recent Labs  04/28/13 0100 04/29/13 0530  NA 142 145  K 3.4* 4.0  CL 107 106  CO2 27 26  GLUCOSE 93 81  BUN 29* 30*  CREATININE 1.19* 1.22*    Recent Labs  04/26/13 0930 04/26/13 1435  TROPONINI 1.08* 0.63*   Hepatic Function Panel No results found for this basename: PROT, ALBUMIN, AST, ALT, ALKPHOS, BILITOT, BILIDIR, IBILI,  in the last 72 hours Lipid Panel      Component Value Date/Time   TRIG 71 12/24/2012 1127   HDL 63 12/24/2012 1127   CHOLHDL 3.0 12/24/2012 1127   LDLCALC 109* 12/24/2012 1127   Telemetry: No significant arrhythmias.  Assessment/Plan:  1. Shortness of breath probably related to acute on chronic diastolic CHF with non-ST elevation myocardial infarction, and underlying COPD and possible pulmonary fibrosis. Acute myocardial infarction may have tipped her over to acute onset of dyspnea. Chest x-ray findings from yesterday after diuresis revealed significant improvement in the infiltrates, suggest pulmonary edema.  Echocardiogram 04/28/2013: Mild to moderate decrease in LV systolic function, EF 40-45% with global hypokinesis and possibly distal inferoseptal, anterolateral and apical hypokinesis, wall motion abnormality could not be excluded. The IVC was markedly dilated with absent respiratory variation suggest elevated right ventricular and right-sided pressure.  Physical examination with diffuse expiratory wheezes, coarse crackles at the bases more consistent with COPD.   Clinical findings are confusing with either significant underlying CAD leading to pulmonary edema and underlying pulmonary pathology should be kept in mind including pulmonary embolism although appears to be unlikely.  EKG: 04/26/2013: Sinus rhythm with first degree AV block, left atrial abnormality, inferior infarct, probably old nonspecific diffuse T wave changes, cannot exclude inferior and anterior ischemia. No prior EKG to compare.  2. NSTEMI  3. Hyperlipidemia- mild  4. Chronic renal insufficiency- mild.  5. Mild cognitive impairment  6. Degenerative joint disease.  Recommendation: Patient's symptoms of shortness of breath appear to be related to non-ST elevation MI, with pulmonary edema on top of underlying lung  pathology including pulmonary fibrosis with bibasilar scarring. She may have chronic mild aspiration.  Continue diuresis with furosemide 40 mg times  one today by mouth and switch her over to furosemide 20 mg by mouth daily on discharge, I will see her back in the office in about one week to 10 days at that time consider switching her over to chlorthalidone. Conservative therapy for non-ST elevation myocardial infarction per patient family wishes. Thank you for involving me in the care of the patient.  I will add lisinopril 2.5 mg by mouth daily today.  Pamella Pert, M.D. 04/29/2013, 8:54 AM [facility] Pager: [phone] Office: [phone] If no answer: [phone]

## 2013-04-30 ENCOUNTER — Ambulatory Visit: Payer: Medicare Other | Admitting: Internal Medicine

## 2013-04-30 DIAGNOSIS — E785 Hyperlipidemia, unspecified: Secondary | ICD-10-CM

## 2013-04-30 DIAGNOSIS — J449 Chronic obstructive pulmonary disease, unspecified: Secondary | ICD-10-CM

## 2013-04-30 DIAGNOSIS — K5900 Constipation, unspecified: Secondary | ICD-10-CM

## 2013-04-30 LAB — BASIC METABOLIC PANEL
CO2: 27 mEq/L (ref 19–32)
Calcium: 8.5 mg/dL (ref 8.4–10.5)
GFR calc non Af Amer: 30 mL/min — ABNORMAL LOW (ref >90)
Potassium: 4.1 mEq/L (ref 3.5–5.1)
Sodium: 144 mEq/L (ref 135–145)

## 2013-04-30 MED ORDER — ZOLPIDEM TARTRATE 5 MG PO TABS
5.0000 mg | ORAL_TABLET | Freq: Once | ORAL | Status: AC
  Administered 2013-04-30: 23:00:00 5 mg via ORAL
  Filled 2013-04-30: qty 1

## 2013-04-30 MED ORDER — ISOSORBIDE MONONITRATE ER 30 MG PO TB24: 30.0000 mg | ORAL_TABLET | Freq: Every day | ORAL | Status: DC

## 2013-04-30 MED ORDER — FUROSEMIDE 20 MG PO TABS: 20.0000 mg | ORAL_TABLET | Freq: Every day | ORAL | Status: DC

## 2013-04-30 MED ORDER — LISINOPRIL 2.5 MG PO TABS: 2.5000 mg | ORAL_TABLET | Freq: Every day | ORAL | Status: DC

## 2013-04-30 MED ORDER — ATORVASTATIN CALCIUM 10 MG PO TABS: 10.0000 mg | ORAL_TABLET | Freq: Every day | ORAL | Status: DC

## 2013-04-30 MED ORDER — METOPROLOL TARTRATE 50 MG PO TABS: 50.0000 mg | ORAL_TABLET | Freq: Two times a day (BID) | ORAL | Status: DC

## 2013-04-30 MED ORDER — CLOPIDOGREL BISULFATE 75 MG PO TABS: 75.0000 mg | ORAL_TABLET | Freq: Every day | ORAL | Status: DC

## 2013-04-30 NOTE — Progress Notes (Signed)
[facility] PROGRESS NOTE  Assessment/Plan: Acute Systolic and diastolic heart failure: - acute heart failure was most likely due to NSTEMI: - Cont lasix, orally as recommended by cardiology - She had crackles bilaterally and + JVD on admission; now  respiration seems to be improved and no JVD or crackleson exam.  - Echo showed EF 45% with diastolic heart failure and diffuse hypokinesis .  -BMET and BNP in am  *ACS (acute coronary syndrome)/NSTEMI: - will continue nitrates, beta-blocker, statins, ACE and plavix. Patient received 48hours of IV heparin  - Cardiac markers trending down and patient w/o CP or SOB - Appreciate cardiology assistance. -patient will follow with Dr. Okey Regal in 1 week  Dementia in conditions classified elsewhere with behavioral disturbance(294.11) - Cont. aricept and supportive care   Hypertension: - Improved. -continue current medications and low sodium diet    Code Status: full Family Communication: no family at bedside Disposition Plan: SNF at discharge   Consultants:  cardiology  Procedures:  Echo: EF 40-45%, diffuse hypokinesis and increased filling pressures suggesting diastolic dysfunction.  Antibiotics:  None  HPI/Subjective: No CP, no SOB. Patient with flat affect.  Objective: Filed Vitals:   04/30/13 0554 04/30/13 0614 04/30/13 0936 04/30/13 1402  BP: 120/59  113/56 108/59  Pulse: 74  78 65  Temp: 98 F (36.7 C)  97.8 F (36.6 C) 97.5 F (36.4 C)  TempSrc: Oral  Oral Oral  Resp: 18  18 18   Height:      Weight:  54.976 kg (121 lb 3.2 oz)    SpO2: 98%  94% 97%    Intake/Output Summary (Last 24 hours) at 04/30/13 1549 Last data filed at 04/30/13 1300  Gross per 24 hour  Intake    820 ml  Output    450 ml  Net    370 ml   Filed Weights   04/28/13 0451 04/29/13 0453 04/30/13 0614  Weight: 55.883 kg (123 lb 3.2 oz) 54.976 kg (121 lb 3.2 oz) 54.976 kg (121 lb 3.2 oz)    Exam:  General: Alert, awake, oriented  x2, in no acute distress.  HEENT: No bruits, no goiter. No JVD Heart: Regular rate, no rubs or gallops Lungs: Good air movement bilaterally, clear to auscukltation Abdomen: Soft, nontender, nondistended, positive bowel sounds.  Extremities: no edema, no cyanosis or clubbing   Data Reviewed: Basic Metabolic Panel:  Recent Labs Lab 04/25/13 2130 04/28/13 0100 04/29/13 0530 04/30/13 0030  NA 148* 142 145 144  K 3.8 3.4* 4.0 4.1  CL 112 107 106 105  CO2 24 27 26 27   GLUCOSE 112* 93 81 83  BUN 20 29* 30* 38*  CREATININE 1.15* 1.19* 1.22* 1.46*  CALCIUM 8.7 8.7 8.7 8.5   Liver Function Tests:  Recent Labs Lab 04/25/13 2130  AST 27  ALT 11  ALKPHOS 51  BILITOT 0.2*  PROT 6.9  ALBUMIN 3.2*   CBC:  Recent Labs Lab 04/25/13 2130  WBC 13.1*  NEUTROABS 10.2*  HGB 10.2*  HCT 30.6*  MCV 90.8  PLT 210   Cardiac Enzymes:  Recent Labs Lab 04/25/13 2156 04/26/13 0500 04/26/13 0930 04/26/13 1435  TROPONINI 0.78* 1.20* 1.08* 0.63*   BNP (last 3 results)  Recent Labs  04/27/13 1559 04/28/13 0100  PROBNP 33197.0* 32708.0*    Studies: No results found.  Scheduled Meds: . aspirin EC  81 mg Oral Daily  . atorvastatin  10 mg Oral q1800  . clopidogrel  75 mg Oral Q breakfast  .  darifenacin  7.5 mg Oral Daily  . docusate sodium  100 mg Oral BID  . donepezil  10 mg Oral QHS  . furosemide  20 mg Oral Daily  . heparin  5,000 Units Subcutaneous Q12H  . isosorbide mononitrate  30 mg Oral Daily  . lisinopril  2.5 mg Oral Daily  . metoprolol tartrate  50 mg Oral BID  . polyethylene glycol  17 g Oral Daily   Continuous Infusions:   Time: < 30 minutes   Sankalp Ferrell  [facility] Pager [phone]. If 8PM-8AM, please contact night-coverage at [email or web address], password [facility] 04/30/2013, 3:49 PM  LOS: 5 days

## 2013-04-30 NOTE — Progress Notes (Signed)
Pt foley leaking, pt on 20 PO lasix, no pressure wounds noted to patient. Foley removed. Baron Hamper, RN

## 2013-05-01 LAB — BASIC METABOLIC PANEL
BUN: 32 mg/dL — ABNORMAL HIGH (ref 6–23)
Calcium: 8.2 mg/dL — ABNORMAL LOW (ref 8.4–10.5)
Chloride: 102 mEq/L (ref 96–112)
GFR calc Af Amer: 44 mL/min — ABNORMAL LOW (ref >90)
GFR calc non Af Amer: 38 mL/min — ABNORMAL LOW (ref >90)
Potassium: 4.1 mEq/L (ref 3.5–5.1)
Sodium: 138 mEq/L (ref 135–145)

## 2013-05-01 LAB — PRO B NATRIURETIC PEPTIDE: Pro B Natriuretic peptide (BNP): 3371 pg/mL — ABNORMAL HIGH (ref 0–450)

## 2013-05-01 NOTE — Progress Notes (Signed)
Report called to Demetrice, LPN regarding pt'd d/c to NH>  Verbalized understanding.  Will continue to monitor.  Amanda Pea, Charity fundraiser.

## 2013-05-01 NOTE — Discharge Summary (Signed)
Physician Discharge Summary  [name] ZOX:[id number] DOB: 05-30-1919 DOA: 04/25/2013  PCP: Oneal Grout, MD  Admit date: 04/25/2013 Discharge date: 05/01/2013  Time spent: >30 minutes  Recommendations for Outpatient Follow-up:  1. BMET to follow electrolytes and renal function 2. Reassess BP and adjust medications as needed 3. Follow up with cardiology in one week  Discharge Diagnoses:  Principal Problem:   ACS (acute coronary syndrome) Active Problems:   Hypertension   Dementia in conditions classified elsewhere with behavioral disturbance(294.11)   Anxiety state, unspecified   Wheezing   Acute combined systolic and diastolic congestive heart failure   Discharge Condition: stable and improved. Will discharge to SNF. [facility]  Diet recommendation: low sodium heart healthy diet  Filed Weights   04/29/13 0453 04/30/13 0614 05/01/13 0604  Weight: 54.976 kg (121 lb 3.2 oz) 54.976 kg (121 lb 3.2 oz) 55.5 kg (122 lb 5.7 oz)    History of present illness:  79 y.o. female with hx of COPD, mild dementia, spinal stenosis, HTN, lives at home alone, visited family for Thanksgiving dinner, was noted to be a little short of breath. She denied any coughs, fever or chills. She was noted to be wheezing and had tachypnea. Family brought her to the ER via POV. She generally has no problems and gets around well. Evalaution in the ER included an EKG, which showed NSR, and non specific ST-T changes. Her CXR showed no infiltrates. Her troponin, however, was elevated at 0.68, then 0.78 with Cr of 1.1. She was given nebulizer treatment, with slight improvement. Hospitalist was asked to admit her for COPD exacerbation. There were never any complaint of chest pain.  Hospital Course:  Acute Systolic and diastolic heart failure:  - acute heart failure was most likely due to NSTEMI:  - Cont lasix, orally as recommended by cardiology  - She had crackles bilaterally and + JVD on  admission; now respiration seems to be improved and no JVD or crackles on exam.  - Echo showed EF 45% with diastolic heart failure and diffuse hypokinesis .  -close follow up to her weight changes and low sodium heart healthy diet -BMET during follow up visit  *ACS (acute coronary syndrome)/NSTEMI:  - will continue nitrates, beta-blocker, statins, ACE and plavix.  Patient received 48hours of IV heparin while inpatient. -Cardiac markers trending down and patient w/o CP or SOB  -Appreciate cardiology assistance.  -patient will follow with Dr. Okey Regal in 1 week   Dementia in conditions classified elsewhere with behavioral disturbance(294.11)  -Cont. aricept and supportive care   Hypertension:  -Improved and stable.  -continue current medications and low sodium diet  Physical deconditioning and weakness -will discharge to SNF for rehab.  Constipation -continue miralax (hold for diarrhea)  *Rest of medical problems remains stable and the plan is to continue current medical regimen and to follow with PCP for further medication adjustment as needed for her chronic conditions  Procedures:  See below for x-ray reports  Echo: EF 40-45%, diffuse hypokinesis and increased filling pressures suggesting diastolic dysfunction.  Consultations:  Cardiology   Discharge Exam: Filed Vitals:   05/01/13 0952  BP: 111/58  Pulse: 73  Temp:   Resp:    General: Alert, awake, oriented x2, in no acute distress.  HEENT: No bruits, no goiter. No JVD  Heart: Regular rate, no rubs or gallops  Lungs: Good air movement bilaterally, clear to auscultation  Abdomen: Soft, nontender, nondistended, positive bowel sounds.  Extremities: no edema, no cyanosis or  clubbing    Discharge Instructions  Discharge Orders   Future Appointments Provider Department Dept Phone   05/07/2013 12:00 PM Oneal Grout, MD [facility] [phone]   05/15/2013 1:30 PM Lenn Sink, DPM [facility] at  [facility] [phone]   Future Orders Complete By Expires   Discharge instructions  As directed    Comments:     Follow up with Dr. Jacinto Halim in 1 week Take medications as prescribed Follow a low sodium heart healthy diet Physical rehabilitation per SNF protocol       Medication List    STOP taking these medications       cephALEXin 500 MG capsule  Commonly known as:  KEFLEX      TAKE these medications       atorvastatin 10 MG tablet  Commonly known as:  LIPITOR  Take 1 tablet (10 mg total) by mouth daily at 6 PM.     clopidogrel 75 MG tablet  Commonly known as:  PLAVIX  Take 1 tablet (75 mg total) by mouth daily with breakfast.     donepezil 10 MG tablet  Commonly known as:  ARICEPT  Take 1 tablet (10 mg total) by mouth at bedtime.     furosemide 20 MG tablet  Commonly known as:  LASIX  Take 1 tablet (20 mg total) by mouth daily.     isosorbide mononitrate 30 MG 24 hr tablet  Commonly known as:  IMDUR  Take 1 tablet (30 mg total) by mouth daily.     lisinopril 2.5 MG tablet  Commonly known as:  PRINIVIL,ZESTRIL  Take 1 tablet (2.5 mg total) by mouth daily.     metoprolol 50 MG tablet  Commonly known as:  LOPRESSOR  Take 1 tablet (50 mg total) by mouth 2 (two) times daily.     multivitamin tablet  Take 1 tablet by mouth daily.     nystatin-triamcinolone cream  Commonly known as:  MYCOLOG II  APPLY 1 APPLICATION TOPICALLY 2 (TWO) TIMES DAILY.     polyethylene glycol packet  Commonly known as:  MIRALAX / GLYCOLAX  Take 17 g by mouth daily as needed for mild constipation.     solifenacin 5 MG tablet  Commonly known as:  VESICARE  Take 1 tablet (5 mg total) by mouth daily.       No Known Allergies     Follow-up Information   Follow up with Pamella Pert, MD On 05/09/2013. (12:30 pm. Bring all medications)    Specialty:  Cardiology   Contact information:   [address] [place] [address] [phone]       Follow up with  Oneal Grout, MD. Schedule an appointment as soon as possible for a visit on 05/07/2013. (@12 :00 spoke with Nicole Cella)    Specialty:  Internal Medicine   Contact information:   [address] [place] [address] [phone]       The results of significant diagnostics from this hospitalization (including imaging, microbiology, ancillary and laboratory) are listed below for reference.    Significant Diagnostic Studies: Dg Chest 2 View  04/28/2013   CLINICAL DATA:  Pulmonary edema  EXAM: CHEST  2 VIEW  COMPARISON:  04/27/2013.  FINDINGS: Is bilateral mild interstitial thickening patchy alveolar airspace opacities. There is a trace right pleural effusion. There is no left pleural effusion. Stable cardiomediastinal contours . The osseous structures are unremarkable.  IMPRESSION: Bilateral interstitial alveolar airspace opacities as can be seen with mild  pulmonary edema versus atypical infection   Electronically Signed   By: Elige Ko   On: 04/28/2013 12:43   Dg Chest 2 View  04/27/2013   CLINICAL DATA:  79 year old female with shortness of breath, pulmonary fibrosis, cough. Initial encounter.  EXAM: CHEST  2 VIEW  COMPARISON:  04/25/2013 and earlier.  FINDINGS: Semi upright AP and lateral views of the chest. Stable cardiomegaly and mediastinal contours. Hazy increased bilateral perihilar opacity greater than that at baseline, although Lung volumes are lower. No pneumothorax. Small pleural effusions suspected. No definite consolidation. Osteopenia.  IMPRESSION: Lower lung volumes with hazy bilateral perihilar opacity, which could reflect atelectasis, but interstitial edema or atypical infection not excluded. Small pleural effusions. Chronic cardiomegaly.   Electronically Signed   By: Augusto Gamble M.D.   On: 04/27/2013 16:59   Dg Chest Portable 1 View  04/25/2013   CLINICAL DATA:  Shortness of breath and wheezing.  Former smoker.  EXAM: PORTABLE CHEST - 1 VIEW  COMPARISON:  03/24/2008  FINDINGS:  Of the shallow inspiration. Borderline heart size and pulmonary vascularity, likely normal for technique. There are interstitial changes in the lung bases bilaterally which are more prominent than previous study. This could represent progressive fibrosis, chronic bronchitic change, or developing interstitial pneumonitis. No blunting of costophrenic angles. No pneumothorax. No focal consolidation. Calcified and tortuous aorta. Prominent degenerative changes in the shoulders.  IMPRESSION: Increasing interstitial changes in the lungs of nonspecific etiology. No evidence of airspace disease or consolidation.   Electronically Signed   By: Burman Nieves M.D.   On: 04/25/2013 21:54   Labs: Basic Metabolic Panel:  Recent Labs Lab 04/25/13 2130 04/28/13 0100 04/29/13 0530 04/30/13 0030 05/01/13 0550  NA 148* 142 145 144 138  K 3.8 3.4* 4.0 4.1 4.1  CL 112 107 106 105 102  CO2 24 27 26 27 25   GLUCOSE 112* 93 81 83 78  BUN 20 29* 30* 38* 32*  CREATININE 1.15* 1.19* 1.22* 1.46* 1.22*  CALCIUM 8.7 8.7 8.7 8.5 8.2*   Liver Function Tests:  Recent Labs Lab 04/25/13 2130  AST 27  ALT 11  ALKPHOS 51  BILITOT 0.2*  PROT 6.9  ALBUMIN 3.2*   CBC:  Recent Labs Lab 04/25/13 2130  WBC 13.1*  NEUTROABS 10.2*  HGB 10.2*  HCT 30.6*  MCV 90.8  PLT 210   Cardiac Enzymes:  Recent Labs Lab 04/25/13 2156 04/26/13 0500 04/26/13 0930 04/26/13 1435  TROPONINI 0.78* 1.20* 1.08* 0.63*   BNP: BNP (last 3 results)  Recent Labs  04/27/13 1559 04/28/13 0100 05/01/13 0550  PROBNP 33197.0* 32708.0* 3371.0*    Signed:  Cloud Graham  [facility] 05/01/2013, 11:17 AM

## 2013-05-02 ENCOUNTER — Encounter: Payer: Self-pay | Admitting: Internal Medicine

## 2013-05-02 ENCOUNTER — Non-Acute Institutional Stay (SKILLED_NURSING_FACILITY): Payer: Medicare Other | Admitting: Internal Medicine

## 2013-05-02 DIAGNOSIS — F0281 Dementia in other diseases classified elsewhere with behavioral disturbance: Secondary | ICD-10-CM

## 2013-05-02 DIAGNOSIS — N182 Chronic kidney disease, stage 2 (mild): Secondary | ICD-10-CM

## 2013-05-02 DIAGNOSIS — I509 Heart failure, unspecified: Secondary | ICD-10-CM

## 2013-05-02 DIAGNOSIS — I249 Acute ischemic heart disease, unspecified: Secondary | ICD-10-CM

## 2013-05-02 DIAGNOSIS — J449 Chronic obstructive pulmonary disease, unspecified: Secondary | ICD-10-CM

## 2013-05-02 DIAGNOSIS — I200 Unstable angina: Secondary | ICD-10-CM

## 2013-05-02 DIAGNOSIS — I10 Essential (primary) hypertension: Secondary | ICD-10-CM

## 2013-05-02 DIAGNOSIS — J4489 Other specified chronic obstructive pulmonary disease: Secondary | ICD-10-CM

## 2013-05-02 DIAGNOSIS — F02818 Dementia in other diseases classified elsewhere, unspecified severity, with other behavioral disturbance: Secondary | ICD-10-CM

## 2013-05-02 DIAGNOSIS — E785 Hyperlipidemia, unspecified: Secondary | ICD-10-CM

## 2013-05-02 DIAGNOSIS — I5041 Acute combined systolic (congestive) and diastolic (congestive) heart failure: Secondary | ICD-10-CM

## 2013-05-02 NOTE — Assessment & Plan Note (Signed)
In hospital BUN 20-38 and CR 1.15-1.46 so this must be her baseline

## 2013-05-02 NOTE — Assessment & Plan Note (Signed)
Most likely had NSTEMI presenting with SOB, CHF exacerbation;noted by elevated troponin;troponins started dropping in hosp; Continue nitrates, metoprolol,,lisinopril,plavix, lipitor

## 2013-05-02 NOTE — Assessment & Plan Note (Signed)
Continue aricept 

## 2013-05-02 NOTE — Assessment & Plan Note (Signed)
Most likely due to NSTEMI; ECHO :EF 40-50%, diffuse hypokinesis anmd increased filling pressure c/w diastolic dysfuntion;samr meds as above plus lasix; watch weight; BMP 1 week

## 2013-05-02 NOTE — Progress Notes (Signed)
MRN: [id number] Name: [name]  Sex: female Age: 79 y.o. DOB: [birth year]  PSC #: Ronni Rumble Facility/Room: 119 Level Of Care: SNF Provider: Merrilee Seashore D Emergency Contacts: Extended Emergency Contact Information Primary Emergency Contact: Cheatham,Jaqueline Address: [address]          [place] [address] [place] Home Phone: [phone] Mobile Phone: [phone] Relation: Daughter  Code Status:   Allergies: Review of patient's allergies indicates no known allergies.  Chief Complaint  Patient presents with  . nursing home admission    HPI: Patient is 79 y.o. female who had a NSTEMI and acute on chronic CHF who is being admitted for OT/PT because of weakness and deconditioning.  Past Medical History  Diagnosis Date  . Frequency of urination   . Incontinence   . Constipation   . Hypertension   . COPD (chronic obstructive pulmonary disease)   . Pneumonia   . Dementia   . Spinal stenosis     Past Surgical History  Procedure Laterality Date  . Colonoscopy  1991      Medication List       This list is accurate as of: 05/02/13 10:19 AM.  Always use your most recent med list.               atorvastatin 10 MG tablet  Commonly known as:  LIPITOR  Take 1 tablet (10 mg total) by mouth daily at 6 PM.     clopidogrel 75 MG tablet  Commonly known as:  PLAVIX  Take 1 tablet (75 mg total) by mouth daily with breakfast.     donepezil 10 MG tablet  Commonly known as:  ARICEPT  Take 1 tablet (10 mg total) by mouth at bedtime.     furosemide 20 MG tablet  Commonly known as:  LASIX  Take 1 tablet (20 mg total) by mouth daily.     isosorbide mononitrate 30 MG 24 hr tablet  Commonly known as:  IMDUR  Take 1 tablet (30 mg total) by mouth daily.     lisinopril 2.5 MG tablet  Commonly known as:  PRINIVIL,ZESTRIL  Take 1 tablet (2.5 mg total) by mouth daily.     metoprolol 50 MG tablet  Commonly known as:  LOPRESSOR  Take 1 tablet  (50 mg total) by mouth 2 (two) times daily.     multivitamin tablet  Take 1 tablet by mouth daily.     nystatin-triamcinolone cream  Commonly known as:  MYCOLOG II  APPLY 1 APPLICATION TOPICALLY 2 (TWO) TIMES DAILY.     polyethylene glycol packet  Commonly known as:  MIRALAX / GLYCOLAX  Take 17 g by mouth daily as needed for mild constipation.     solifenacin 5 MG tablet  Commonly known as:  VESICARE  Take 1 tablet (5 mg total) by mouth daily.        No orders of the defined types were placed in this encounter.    Immunization History  Administered Date(s) Administered  . Pneumococcal Polysaccharide-23 05/29/1992    History  Substance Use Topics  . Smoking status: Former Smoker    Quit date: 09/27/1978  . Smokeless tobacco: Not on file  . Alcohol Use: No    Family history is noncontributory    Review of Systems unable sec to dementia-nurses do not report problems  Filed Vitals:   05/02/13 0947  BP: 137/67  Pulse: 69  Temp: 98 F (36.7 C)  Resp: 17    Physical Exam  GENERAL APPEARANCE: Alert, conversant. Appropriately groomed. No acute distress.  SKIN: No diaphoresis rash, or wounds HEAD: Normocephalic, atraumatic  EYES: Conjunctiva/lids clear. Pupils round, reactive. EOMs intact.  EARS: External exam WNL, canals clear. Hearing grossly normal.  NOSE: No deformity or discharge.  MOUTH/THROAT: Lips w/o lesions. RESPIRATORY: Breathing is even, unlabored. Lung sounds are clear   CARDIOVASCULAR: Heart RRR no murmurs, rubs or gallops. No peripheral edema.   GASTROINTESTINAL: Abdomen is soft, non-tender, not distended w/ normal bowel sounds GENITOURINARY: Bladder non tender, not distended  MUSCULOSKELETAL: No abnormal joints or musculature NEUROLOGIC: Oriented X 1; Cranial nerves 2-12 grossly intact. Moves all extremities no tremor. PSYCHIATRIC: Mood and affect appropriate to situation, no behavioral issues  Patient Active Problem List   Diagnosis Date Noted   . Acute combined systolic and diastolic congestive heart failure 04/28/2013  . ACS (acute coronary syndrome) 04/25/2013  . Wheezing 04/25/2013  . Dementia in conditions classified elsewhere with behavioral disturbance(294.11) 04/22/2013  . Anxiety state, unspecified 04/22/2013  . Depressive disorder, not elsewhere classified 04/22/2013  . Chronic airway obstruction, not elsewhere classified 04/22/2013  . Hallucinations 04/22/2013  . Abnormality of gait 04/22/2013  . Mixed Alzheimer's and vascular dementia 01/22/2013  . Routine general medical examination at a health care facility 01/22/2013  . Chronic kidney disease (CKD), stage II (mild) 01/22/2013  . Other and unspecified hyperlipidemia 01/22/2013  . Cartilage disorder 12/24/2012  . Dementia 12/24/2012  . Constipation   . Hypertension     CBC    Component Value Date/Time   WBC 13.1* 04/25/2013 2130   WBC 12.0* 04/16/2013 1447   RBC 3.37* 04/25/2013 2130   RBC 3.98 04/16/2013 1447   HGB 10.2* 04/25/2013 2130   HCT 30.6* 04/25/2013 2130   PLT 210 04/25/2013 2130   MCV 90.8 04/25/2013 2130   LYMPHSABS 1.8 04/25/2013 2130   LYMPHSABS 2.2 04/16/2013 1447   MONOABS 1.1* 04/25/2013 2130   EOSABS 0.0 04/25/2013 2130   EOSABS 0.0 04/16/2013 1447   BASOSABS 0.0 04/25/2013 2130   BASOSABS 0.0 04/16/2013 1447    CMP     Component Value Date/Time   NA 138 05/01/2013 0550   NA 144 04/16/2013 1447   K 4.1 05/01/2013 0550   CL 102 05/01/2013 0550   CO2 25 05/01/2013 0550   GLUCOSE 78 05/01/2013 0550   GLUCOSE 91 04/16/2013 1447   BUN 32* 05/01/2013 0550   BUN 25 04/16/2013 1447   CREATININE 1.22* 05/01/2013 0550   CALCIUM 8.2* 05/01/2013 0550   PROT 6.9 04/25/2013 2130   PROT 6.9 04/16/2013 1447   ALBUMIN 3.2* 04/25/2013 2130   AST 27 04/25/2013 2130   ALT 11 04/25/2013 2130   ALKPHOS 51 04/25/2013 2130   BILITOT 0.2* 04/25/2013 2130   GFRNONAA 38* 05/01/2013 0550   GFRAA 44* 05/01/2013 0550    Assessment and Plan  ACS  (acute coronary syndrome) Most likely had NSTEMI presenting with SOB, CHF exacerbation;noted by elevated troponin;troponins started dropping in hosp; Continue nitrates, metoprolol,,lisinopril,plavix, lipitor  Acute combined systolic and diastolic congestive heart failure Most likely due to NSTEMI; ECHO :EF 40-50%, diffuse hypokinesis anmd increased filling pressure c/w diastolic dysfuntion;samr meds as above plus lasix; watch weight; BMP 1 week  Dementia in conditions classified elsewhere with behavioral disturbance(294.11) Continue aricept  Hypertension Stable on CHF and CAD meds  Chronic airway obstruction, not elsewhere classified Stable at this time  Chronic kidney disease (CKD), stage II (mild) In hospital BUN 20-38 and CR 1.15-1.46 so this must be  her baseline  Other and unspecified hyperlipidemia Continue lipitor    Margit Hanks, MD

## 2013-05-02 NOTE — Assessment & Plan Note (Signed)
Continue lipitor  ?

## 2013-05-02 NOTE — Assessment & Plan Note (Signed)
Stable on CHF and CAD meds

## 2013-05-02 NOTE — Assessment & Plan Note (Signed)
Stable at this time 

## 2013-05-07 ENCOUNTER — Ambulatory Visit: Payer: Medicare Other | Admitting: Internal Medicine

## 2013-05-09 ENCOUNTER — Other Ambulatory Visit: Payer: Self-pay | Admitting: Cardiology

## 2013-05-09 DIAGNOSIS — R0602 Shortness of breath: Secondary | ICD-10-CM

## 2013-05-11 NOTE — Clinical Social Work Placement (Addendum)
    Clinical Social Work Department CLINICAL SOCIAL WORK PLACEMENT NOTE 05/11/2013  Patient:  [name], [name]  Account Number:  [id number] Admit date:  04/25/2013  Clinical Social Worker:  Lupita Leash Duffy Dantonio, LCSWA  Date/time:  04/29/2013 03:55 PM  Clinical Social Work is seeking post-discharge placement for this patient at the following level of care:   SKILLED NURSING   (*CSW will update this form in Epic as items are completed)   04/29/2013  Patient/family provided with [facility] Department of Clinical Social Work's list of facilities offering this level of care within the geographic area requested by the patient (or if unable, by the patient's family).  04/29/2013  Patient/family informed of their freedom to choose among providers that offer the needed level of care, that participate in Medicare, Medicaid or managed care program needed by the patient, have an available bed and are willing to accept the patient.  04/29/2013  Patient/family informed of [facility]' ownership interest in [facility], as well as of the fact that they are under no obligation to receive care at this facility.  PASARR submitted to EDS on 04/29/2013 PASARR number received from EDS on 04/29/2013  FL2 transmitted to all facilities in geographic area requested by pt/family on  04/29/2013 FL2 transmitted to all facilities within larger geographic area on   Patient informed that his/her managed care company has contracts with or will negotiate with  certain facilities, including the following:   [facility]- Medicare Complete     Patient/family informed of bed offers received:  04/30/2013 Patient chooses bed at [facility], [place] Physician recommends and patient chooses bed at    Patient to be transferred to [facility] [place] on  05/01/2013 Patient to be transferred to facility by Ambulance Sharin Mons)  The following physician request were entered in Epic:   Additional  Comments: 05/01/13  OK per MD for d/c today to SNF. Ok per patient's daughter Adela Lank who was pleased with d/c plan. Patient remains confused.  Nursing notified to call report.  No further CSW needs identified. CSW signing off.  Lorri Frederick. Rayden Dock, LCSWA [phone]

## 2013-05-11 NOTE — Clinical Social Work Psychosocial (Addendum)
    Clinical Social Work Department BRIEF PSYCHOSOCIAL ASSESSMENT 05/11/2013  Patient:  [name], [name]     Account Number:  [id number]     Admit date:  04/25/2013  Clinical Social Worker:  Tiburcio Pea  Date/Time:  04/29/2013 12:44 PM  Referred by:  Physician  Date Referred:  04/28/2013 Referred for  SNF Placement   Other Referral:   Interview type:  Other - See comment Other interview type:   Patient (mostly confused) and daughter    PSYCHOSOCIAL DATA Living Status:  WITH ADULT CHILDREN Admitted from facility:   Level of care:   Primary support name:  [name]  (c) [phone] Primary support relationship to patient:  CHILD, ADULT Degree of support available:   Very strong support    CURRENT CONCERNS Current Concerns  Post-Acute Placement   Other Concerns:    SOCIAL WORK ASSESSMENT / PLAN 79 year old female admitted from home.  Physical Therapy is recommending SNF placement. CSW met with patient who was found to be pleasantly confused. She gave permission to contact her daughter [name]. CSW spoke to daughter about above dc recommendation and she agreed to bed search. This process was discussed and initiated.  Fl2 placed on chart for MD's signature.   Assessment/plan status:  Psychosocial Support/Ongoing Assessment of Needs Other assessment/ plan:   Information/referral to community resources:   SNF bed list left in patient's room for daughter's review.    PATIENT'S/FAMILY'S RESPONSE TO PLAN OF CARE: Patient is mostly confused but again- very pleasant. She defers to her daughter for d/c planning needs.  Daughter was very appreciative of CSW's invovlement and she is very involved in her mother's care. CSW will asisst with d/c to SNF when medically stable. Lorri Frederick. Nicie Milan, LCSWA [phone]

## 2013-05-13 ENCOUNTER — Ambulatory Visit
Admission: RE | Admit: 2013-05-13 | Discharge: 2013-05-13 | Disposition: A | Discharge status: Home / Self Care | Payer: Medicare Other | Source: Ambulatory Visit | Attending: Cardiology | Admitting: Cardiology

## 2013-05-13 DIAGNOSIS — R0602 Shortness of breath: Secondary | ICD-10-CM

## 2013-05-15 ENCOUNTER — Ambulatory Visit: Payer: Medicare Other | Admitting: Podiatry

## 2013-05-27 ENCOUNTER — Encounter: Payer: Self-pay | Admitting: Internal Medicine

## 2013-05-27 ENCOUNTER — Non-Acute Institutional Stay (SKILLED_NURSING_FACILITY): Payer: Medicare Other | Admitting: Internal Medicine

## 2013-05-27 DIAGNOSIS — N182 Chronic kidney disease, stage 2 (mild): Secondary | ICD-10-CM

## 2013-05-27 DIAGNOSIS — Z20828 Contact with and (suspected) exposure to other viral communicable diseases: Secondary | ICD-10-CM

## 2013-05-27 DIAGNOSIS — J189 Pneumonia, unspecified organism: Secondary | ICD-10-CM

## 2013-05-27 NOTE — Assessment & Plan Note (Signed)
Ca 8.0 on 12/9 lab draw; will start Calcium carbonate 1000mg  TID ac and check Vit D level

## 2013-05-27 NOTE — Assessment & Plan Note (Signed)
There has been an epidemic in the SNF. Pt tested negative for the flu and is currently being prophylaxed for 14 days with tamiflu which started 12/19

## 2013-05-27 NOTE — Progress Notes (Signed)
MRN: [id number] Name: [name]  Sex: female Age: 79 y.o. DOB: [birth year]  PSC #: Ronni Rumble Facility/Room: 119B Level Of Care: SNF Provider: Merrilee Seashore D Emergency Contacts: Extended Emergency Contact Information Primary Emergency Contact: Cheatham,Jaqueline Address: [address]          [place] [address] [place] Home Phone: [phone] Mobile Phone: [phone] Relation: Daughter  Code Status:   Allergies: Review of patient's allergies indicates no known allergies.  Chief Complaint  Patient presents with  . Acute Visit    HPI: Patient is 79 y.o. female who is being seen for follow up of PNA and for f/u labs  Past Medical History  Diagnosis Date  . Frequency of urination   . Incontinence   . Constipation   . Hypertension   . COPD (chronic obstructive pulmonary disease)   . Pneumonia   . Dementia   . Spinal stenosis     Past Surgical History  Procedure Laterality Date  . Colonoscopy  1991      Medication List       This list is accurate as of: 05/27/13  5:17 PM.  Always use your most recent med list.               atorvastatin 10 MG tablet  Commonly known as:  LIPITOR  Take 1 tablet (10 mg total) by mouth daily at 6 PM.     clopidogrel 75 MG tablet  Commonly known as:  PLAVIX  Take 1 tablet (75 mg total) by mouth daily with breakfast.     donepezil 10 MG tablet  Commonly known as:  ARICEPT  Take 1 tablet (10 mg total) by mouth at bedtime.     furosemide 20 MG tablet  Commonly known as:  LASIX  Take 1 tablet (20 mg total) by mouth daily.     isosorbide mononitrate 30 MG 24 hr tablet  Commonly known as:  IMDUR  Take 1 tablet (30 mg total) by mouth daily.     lisinopril 2.5 MG tablet  Commonly known as:  PRINIVIL,ZESTRIL  Take 1 tablet (2.5 mg total) by mouth daily.     metoprolol 50 MG tablet  Commonly known as:  LOPRESSOR  Take 1 tablet (50 mg total) by mouth 2 (two) times daily.     multivitamin  tablet  Take 1 tablet by mouth daily.     nystatin-triamcinolone cream  Commonly known as:  MYCOLOG II  APPLY 1 APPLICATION TOPICALLY 2 (TWO) TIMES DAILY.     polyethylene glycol packet  Commonly known as:  MIRALAX / GLYCOLAX  Take 17 g by mouth daily as needed for mild constipation.     solifenacin 5 MG tablet  Commonly known as:  VESICARE  Take 1 tablet (5 mg total) by mouth daily.        No orders of the defined types were placed in this encounter.    Immunization History  Administered Date(s) Administered  . Pneumococcal Polysaccharide-23 05/29/1992    History  Substance Use Topics  . Smoking status: Former Smoker    Quit date: 09/27/1978  . Smokeless tobacco: Not on file  . Alcohol Use: No    Review of Systems  DATA OBTAINED: from patient, nurse; PT HAS NO C/O AND NURSES HAVE NO CONCERNS GENERAL: Feels well no fevers, fatigue, appetite changes SKIN: No itching, rash HEENT: No complaint RESPIRATORY: No cough, wheezing, SOB CARDIAC: No chest pain, palpitations, lower extremity edema  GI: No abdominal pain, No  N/V/D or constipation, No heartburn or reflux  GU: No dysuria, frequency or urgency, or incontinence  MUSCULOSKELETAL: No unrelieved bone/joint pain NEUROLOGIC: No headache, dizziness or focal weakness PSYCHIATRIC: No overt anxiety or sadness. Sleeps well.   Filed Vitals:   05/27/13 1647  BP: 108/54  Pulse: 76  Temp: 97 F (36.1 C)  Resp: 18    Physical Exam  GENERAL APPEARANCE: Alert, conversant. Appropriately groomed. No acute distress  SKIN: No diaphoresis rash, or wounds HEENT: Unremarkable RESPIRATORY: Breathing is even, unlabored. Lung sounds are clear   CARDIOVASCULAR: Heart RRR no murmurs, rubs or gallops. No peripheral edema  GASTROINTESTINAL: Abdomen is soft, non-tender, not distended w/ normal bowel sounds.  GENITOURINARY: Bladder non tender, not distended  MUSCULOSKELETAL: No abnormal joints or musculature NEUROLOGIC: Cranial  nerves 2-12 grossly intact. Moves all extremities no tremor. PSYCHIATRIC: Mood and affect appropriate to situation, no behavioral issues  Patient Active Problem List   Diagnosis Date Noted  . Pneumonia 05/27/2013  . Exposure to the flu 05/27/2013  . Hypocalcemia 05/27/2013  . Acute combined systolic and diastolic congestive heart failure 04/28/2013  . ACS (acute coronary syndrome) 04/25/2013  . Wheezing 04/25/2013  . Dementia in conditions classified elsewhere with behavioral disturbance(294.11) 04/22/2013  . Anxiety state, unspecified 04/22/2013  . Depressive disorder, not elsewhere classified 04/22/2013  . Chronic airway obstruction, not elsewhere classified 04/22/2013  . Hallucinations 04/22/2013  . Abnormality of gait 04/22/2013  . Mixed Alzheimer's and vascular dementia 01/22/2013  . Routine general medical examination at a health care facility 01/22/2013  . Chronic kidney disease (CKD), stage II (mild) 01/22/2013  . Other and unspecified hyperlipidemia 01/22/2013  . Cartilage disorder 12/24/2012  . Dementia 12/24/2012  . Constipation   . Hypertension     CBC    Component Value Date/Time   WBC 13.1* 04/25/2013 2130   WBC 12.0* 04/16/2013 1447   RBC 3.37* 04/25/2013 2130   RBC 3.98 04/16/2013 1447   HGB 10.2* 04/25/2013 2130   HCT 30.6* 04/25/2013 2130   PLT 210 04/25/2013 2130   MCV 90.8 04/25/2013 2130   LYMPHSABS 1.8 04/25/2013 2130   LYMPHSABS 2.2 04/16/2013 1447   MONOABS 1.1* 04/25/2013 2130   EOSABS 0.0 04/25/2013 2130   EOSABS 0.0 04/16/2013 1447   BASOSABS 0.0 04/25/2013 2130   BASOSABS 0.0 04/16/2013 1447    CMP     Component Value Date/Time   NA 138 05/01/2013 0550   NA 144 04/16/2013 1447   K 4.1 05/01/2013 0550   CL 102 05/01/2013 0550   CO2 25 05/01/2013 0550   GLUCOSE 78 05/01/2013 0550   GLUCOSE 91 04/16/2013 1447   BUN 32* 05/01/2013 0550   BUN 25 04/16/2013 1447   CREATININE 1.22* 05/01/2013 0550   CALCIUM 8.2* 05/01/2013 0550   PROT 6.9  04/25/2013 2130   PROT 6.9 04/16/2013 1447   ALBUMIN 3.2* 04/25/2013 2130   AST 27 04/25/2013 2130   ALT 11 04/25/2013 2130   ALKPHOS 51 04/25/2013 2130   BILITOT 0.2* 04/25/2013 2130   GFRNONAA 38* 05/01/2013 0550   GFRAA 44* 05/01/2013 0550    Assessment and Plan  Pneumonia Pt treated  With augmentin  875 BID for 7 days and floraster. She is doing well and ling sounds are clear.  Exposure to the flu There has been an epidemic in the SNF. Pt tested negative for the flu and is currently being prophylaxed for 14 days with tamiflu which started 12/19  Hypocalcemia Ca 8.0 on 12/9 lab  draw; will start Calcium carbonate 1000mg  TID ac and check Vit D level    Margit Hanks, MD

## 2013-05-27 NOTE — Assessment & Plan Note (Signed)
12/9 BUN/Cr was 38/1.17 so very good

## 2013-05-27 NOTE — Assessment & Plan Note (Signed)
Pt treated  With augmentin  875 BID for 7 days and floraster. She is doing well and ling sounds are clear.

## 2013-06-17 ENCOUNTER — Encounter: Payer: Self-pay | Admitting: Internal Medicine

## 2013-06-17 ENCOUNTER — Non-Acute Institutional Stay (SKILLED_NURSING_FACILITY): Payer: Medicare Other | Admitting: Internal Medicine

## 2013-06-17 DIAGNOSIS — Z20828 Contact with and (suspected) exposure to other viral communicable diseases: Secondary | ICD-10-CM

## 2013-06-17 DIAGNOSIS — I509 Heart failure, unspecified: Secondary | ICD-10-CM

## 2013-06-17 DIAGNOSIS — J189 Pneumonia, unspecified organism: Secondary | ICD-10-CM

## 2013-06-17 DIAGNOSIS — F0281 Dementia in other diseases classified elsewhere with behavioral disturbance: Secondary | ICD-10-CM

## 2013-06-17 DIAGNOSIS — M79609 Pain in unspecified limb: Secondary | ICD-10-CM

## 2013-06-17 DIAGNOSIS — M79674 Pain in right toe(s): Secondary | ICD-10-CM

## 2013-06-17 DIAGNOSIS — I5041 Acute combined systolic (congestive) and diastolic (congestive) heart failure: Secondary | ICD-10-CM

## 2013-06-17 DIAGNOSIS — F02818 Dementia in other diseases classified elsewhere, unspecified severity, with other behavioral disturbance: Secondary | ICD-10-CM

## 2013-06-17 DIAGNOSIS — I10 Essential (primary) hypertension: Secondary | ICD-10-CM

## 2013-06-17 NOTE — Assessment & Plan Note (Addendum)
Well controlled on B blocker, ACE, Lasix and imdur; plan continue all

## 2013-06-17 NOTE — Assessment & Plan Note (Signed)
Pt continues to do well s/p PNA

## 2013-06-17 NOTE — Assessment & Plan Note (Signed)
Pt finished prophylaxis for flu and has remained well; flu epidemic in SNF is over

## 2013-06-17 NOTE — Assessment & Plan Note (Signed)
Has been stable on B blocker, ACE, Lasix and imdur;no pedal edema and lungs clear

## 2013-06-17 NOTE — Assessment & Plan Note (Signed)
Pt continues on aricept 10 mg and no behavoir problems noted

## 2013-06-17 NOTE — Progress Notes (Signed)
MRN: [id number] Name: [name]  Sex: female Age: 80 y.o. DOB: May 20, 1920  PSC #: Ronni Rumble Facility/Room: 119B Level Of Care: SNF Provider: Merrilee Seashore D Emergency Contacts: Extended Emergency Contact Information Primary Emergency Contact: Cheatham,Jaqueline Address: [address]          [place] [address] [place] Home Phone: [phone] Mobile Phone: [phone] Relation: Daughter  Code Status:   Allergies: Review of patient's allergies indicates no known allergies.  Chief Complaint  Patient presents with  . Medical Managment of Chronic Issues    HPI: Patient is 80 y.o. female who has one c/o and is being seen today for follow up on chronic medical problems.  Past Medical History  Diagnosis Date  . Frequency of urination   . Incontinence   . Constipation   . Hypertension   . COPD (chronic obstructive pulmonary disease)   . Pneumonia   . Dementia   . Spinal stenosis     Past Surgical History  Procedure Laterality Date  . Colonoscopy  1991      Medication List       This list is accurate as of: 06/17/13  8:41 PM.  Always use your most recent med list.               atorvastatin 10 MG tablet  Commonly known as:  LIPITOR  Take 1 tablet (10 mg total) by mouth daily at 6 PM.     clopidogrel 75 MG tablet  Commonly known as:  PLAVIX  Take 1 tablet (75 mg total) by mouth daily with breakfast.     donepezil 10 MG tablet  Commonly known as:  ARICEPT  Take 1 tablet (10 mg total) by mouth at bedtime.     furosemide 20 MG tablet  Commonly known as:  LASIX  Take 1 tablet (20 mg total) by mouth daily.     isosorbide mononitrate 30 MG 24 hr tablet  Commonly known as:  IMDUR  Take 1 tablet (30 mg total) by mouth daily.     lisinopril 2.5 MG tablet  Commonly known as:  PRINIVIL,ZESTRIL  Take 1 tablet (2.5 mg total) by mouth daily.     metoprolol 50 MG tablet  Commonly known as:  LOPRESSOR  Take 1 tablet (50 mg total) by  mouth 2 (two) times daily.     multivitamin tablet  Take 1 tablet by mouth daily.     nystatin-triamcinolone cream  Commonly known as:  MYCOLOG II  APPLY 1 APPLICATION TOPICALLY 2 (TWO) TIMES DAILY.     polyethylene glycol packet  Commonly known as:  MIRALAX / GLYCOLAX  Take 17 g by mouth daily as needed for mild constipation.     solifenacin 5 MG tablet  Commonly known as:  VESICARE  Take 1 tablet (5 mg total) by mouth daily.        No orders of the defined types were placed in this encounter.    Immunization History  Administered Date(s) Administered  . Pneumococcal Polysaccharide-23 05/29/1992    History  Substance Use Topics  . Smoking status: Former Smoker    Quit date: 09/27/1978  . Smokeless tobacco: Not on file  . Alcohol Use: No    Review of Systems  DATA OBTAINED: from patient GENERAL: Feels well no fevers, fatigue, appetite changes SKIN: No itching, rash HEENT: No complaint RESPIRATORY: No cough, wheezing, SOB CARDIAC: No chest pain, palpitations, lower extremity edema  GI: No abdominal pain, No N/V/D or constipation, No  heartburn or reflux  GU: No dysuria, frequency or urgency, or incontinence  MUSCULOSKELETAL:c/o pain r toe that is ingown toe nail she believes;she admits she has an appt with a foot doctor in 2 days NEUROLOGIC: No headache, dizziness or new focal weakness PSYCHIATRIC: No overt anxiety or sadness. Sleeps well.   Filed Vitals:   06/17/13 2031  BP: 110/60  Pulse: 60  Temp: 98.1 F (36.7 C)  Resp: 20    Physical Exam  GENERAL APPEARANCE: Alert, conversant. Appropriately groomed. No acute distress  SKIN: No diaphoresis rash HEENT: Unremarkable RESPIRATORY: Breathing is even, unlabored. Lung sounds are clear   CARDIOVASCULAR: Heart RRR no murmurs, rubs or gallops. No peripheral edema  GASTROINTESTINAL: Abdomen is soft, non-tender, not distended w/ normal bowel sounds.  GENITOURINARY: Bladder non tender, not distended   MUSCULOSKELETAL: No abnormal joints or musculature NEUROLOGIC: Cranial nerves 2-12 grossly intact. Moves all extremities no tremor. PSYCHIATRIC: pleasant, answers appropriately, no behavioral issues  Patient Active Problem List   Diagnosis Date Noted  . Pneumonia 05/27/2013  . Exposure to the flu 05/27/2013  . Hypocalcemia 05/27/2013  . Acute combined systolic and diastolic congestive heart failure 04/28/2013  . ACS (acute coronary syndrome) 04/25/2013  . Wheezing 04/25/2013  . Dementia in conditions classified elsewhere with behavioral disturbance(294.11) 04/22/2013  . Anxiety state, unspecified 04/22/2013  . Depressive disorder, not elsewhere classified 04/22/2013  . Chronic airway obstruction, not elsewhere classified 04/22/2013  . Hallucinations 04/22/2013  . Abnormality of gait 04/22/2013  . Mixed Alzheimer's and vascular dementia 01/22/2013  . Routine general medical examination at a health care facility 01/22/2013  . Chronic kidney disease (CKD), stage II (mild) 01/22/2013  . Other and unspecified hyperlipidemia 01/22/2013  . Cartilage disorder 12/24/2012  . Dementia 12/24/2012  . Constipation   . Hypertension     CBC    Component Value Date/Time   WBC 13.1* 04/25/2013 2130   WBC 12.0* 04/16/2013 1447   RBC 3.37* 04/25/2013 2130   RBC 3.98 04/16/2013 1447   HGB 10.2* 04/25/2013 2130   HCT 30.6* 04/25/2013 2130   PLT 210 04/25/2013 2130   MCV 90.8 04/25/2013 2130   LYMPHSABS 1.8 04/25/2013 2130   LYMPHSABS 2.2 04/16/2013 1447   MONOABS 1.1* 04/25/2013 2130   EOSABS 0.0 04/25/2013 2130   EOSABS 0.0 04/16/2013 1447   BASOSABS 0.0 04/25/2013 2130   BASOSABS 0.0 04/16/2013 1447    CMP     Component Value Date/Time   NA 138 05/01/2013 0550   NA 144 04/16/2013 1447   K 4.1 05/01/2013 0550   CL 102 05/01/2013 0550   CO2 25 05/01/2013 0550   GLUCOSE 78 05/01/2013 0550   GLUCOSE 91 04/16/2013 1447   BUN 32* 05/01/2013 0550   BUN 25 04/16/2013 1447   CREATININE  1.22* 05/01/2013 0550   CALCIUM 8.2* 05/01/2013 0550   PROT 6.9 04/25/2013 2130   PROT 6.9 04/16/2013 1447   ALBUMIN 3.2* 04/25/2013 2130   AST 27 04/25/2013 2130   ALT 11 04/25/2013 2130   ALKPHOS 51 04/25/2013 2130   BILITOT 0.2* 04/25/2013 2130   GFRNONAA 38* 05/01/2013 0550   GFRAA 44* 05/01/2013 0550    Assessment and Plan  Pneumonia Pt continues to do well s/p PNA  Hypertension Well controlled on B blocker, ACE, Lasix and imdur; plan continue all  Acute combined systolic and diastolic congestive heart failure Has been stable on B blocker, ACE, Lasix and imdur;no pedal edema and lungs clear  Dementia in conditions classified elsewhere  with behavioral disturbance(294.11) Pt continues on aricept 10 mg and no behavoir problems noted  Exposure to the flu Pt finished prophylaxis for flu and has remained well; flu epidemic in SNF is over   TOE PAIN- when asked pt admits she has appointment with foot doctor in 2 days  Margit HanksALEXANDER, ANNE D, MD

## 2013-06-19 ENCOUNTER — Encounter: Payer: Self-pay | Admitting: Podiatry

## 2013-06-19 ENCOUNTER — Ambulatory Visit (INDEPENDENT_AMBULATORY_CARE_PROVIDER_SITE_OTHER): Payer: Medicare Other | Admitting: Podiatry

## 2013-06-19 VITALS — BP 110/49 | HR 56 | Resp 16

## 2013-06-19 DIAGNOSIS — M79609 Pain in unspecified limb: Secondary | ICD-10-CM

## 2013-06-19 DIAGNOSIS — B351 Tinea unguium: Secondary | ICD-10-CM

## 2013-06-19 NOTE — Progress Notes (Signed)
Subjective:     Patient ID: [name], female   DOB: 07/27/1919, 80 y.o.   MRN: [id number]  HPI patient has thick painful nail bed 1-5 both feet that she is unable to cut herself   Review of Systems     Objective:   Physical Exam Thick nailbed with pain 1-5 both feet    Assessment:     Mycotic nail infection with pain 1-5 both feet    Plan:     Debridement painful nailbeds 1-5 both feet with no bleeding noted

## 2013-08-13 ENCOUNTER — Non-Acute Institutional Stay (SKILLED_NURSING_FACILITY): Payer: Medicare Other | Admitting: Internal Medicine

## 2013-08-13 ENCOUNTER — Encounter: Payer: Self-pay | Admitting: Internal Medicine

## 2013-08-13 DIAGNOSIS — I119 Hypertensive heart disease without heart failure: Secondary | ICD-10-CM | POA: Insufficient documentation

## 2013-08-13 DIAGNOSIS — I5042 Chronic combined systolic (congestive) and diastolic (congestive) heart failure: Secondary | ICD-10-CM

## 2013-08-13 DIAGNOSIS — N182 Chronic kidney disease, stage 2 (mild): Secondary | ICD-10-CM

## 2013-08-13 DIAGNOSIS — F0150 Vascular dementia without behavioral disturbance: Secondary | ICD-10-CM

## 2013-08-13 DIAGNOSIS — G309 Alzheimer's disease, unspecified: Secondary | ICD-10-CM

## 2013-08-13 DIAGNOSIS — I10 Essential (primary) hypertension: Secondary | ICD-10-CM

## 2013-08-13 DIAGNOSIS — F0280 Dementia in other diseases classified elsewhere without behavioral disturbance: Secondary | ICD-10-CM

## 2013-08-13 DIAGNOSIS — E559 Vitamin D deficiency, unspecified: Secondary | ICD-10-CM | POA: Insufficient documentation

## 2013-08-13 DIAGNOSIS — R3981 Functional urinary incontinence: Secondary | ICD-10-CM

## 2013-08-13 MED ORDER — METOPROLOL TARTRATE 25 MG PO TABS: 25.0000 mg | ORAL_TABLET | Freq: Two times a day (BID) | ORAL | Status: DC

## 2013-08-13 NOTE — Progress Notes (Signed)
Patient ID: [name], female   DOB: September 17, 1919, 80 y.o.   MRN: [id number]  Location:  [facility]  Provider:  Gwenith Spitz. Renato Gails, D.O., C.M.D.  Code Status:    Chief Complaint  Patient presents with  . Medical Management of Chronic Issues  . Acute Visit    bradycardia during pharmacy review    HPI:  BP ranging 120s-130s systolic majority of the time, 50s to 70s diastolic and pulse in 50s-60s.  Pharmacy made note of bradycardia during review of meds.    Review of Systems:  Review of Systems  Constitutional: Negative for fever.  Respiratory: Negative for shortness of breath.   Cardiovascular: Negative for chest pain.  Gastrointestinal: Negative for constipation.  Genitourinary: Positive for urgency.       Incontinence  Neurological: Negative for loss of consciousness and headaches.  Psychiatric/Behavioral: Positive for memory loss.    Medications: Patient's Medications  New Prescriptions   No medications on file  Previous Medications   ACETAMINOPHEN (TYLENOL) 325 MG TABLET    Take 650 mg by mouth every 4 (four) hours as needed for mild pain or fever.   ATORVASTATIN (LIPITOR) 10 MG TABLET    Take 1 tablet (10 mg total) by mouth daily at 6 PM.   CALCIUM CARBONATE (TUMS - DOSED IN MG ELEMENTAL CALCIUM) 500 MG CHEWABLE TABLET    Chew 2 tablets by mouth 3 (three) times daily with meals.   CHOLECALCIFEROL (VITAMIN D) 1000 UNITS TABLET    Take 1,000 Units by mouth daily. For osteoporosis   CLOPIDOGREL (PLAVIX) 75 MG TABLET    Take 1 tablet (75 mg total) by mouth daily with breakfast.   DONEPEZIL (ARICEPT) 10 MG TABLET    Take 1 tablet (10 mg total) by mouth at bedtime.   FUROSEMIDE (LASIX) 20 MG TABLET    Take 1 tablet (20 mg total) by mouth daily.   ISOSORBIDE MONONITRATE (IMDUR) 30 MG 24 HR TABLET    Take 1 tablet (30 mg total) by mouth daily.   LISINOPRIL (PRINIVIL,ZESTRIL) 2.5 MG TABLET    Take 1 tablet (2.5 mg total) by mouth daily.   MAGNESIUM HYDROXIDE  (MILK OF MAGNESIA) 400 MG/5ML SUSPENSION    Take 30 mLs by mouth daily as needed for mild constipation.   MULTIPLE VITAMIN (MULTIVITAMIN) TABLET    Take 1 tablet by mouth daily.   NYSTATIN-TRIAMCINOLONE (MYCOLOG II) CREAM    APPLY 1 APPLICATION TOPICALLY 2 (TWO) TIMES DAILY.   POLYETHYLENE GLYCOL (MIRALAX / GLYCOLAX) PACKET    Take 17 g by mouth daily as needed for mild constipation.    SOLIFENACIN (VESICARE) 5 MG TABLET    Take 5 mg by mouth daily.  Modified Medications   Modified Medication Previous Medication   METOPROLOL (LOPRESSOR) 25 MG TABLET metoprolol (LOPRESSOR) 50 MG tablet      Take 1 tablet (25 mg total) by mouth 2 (two) times daily.    Take 1 tablet (50 mg total) by mouth 2 (two) times daily.  Discontinued Medications   SOLIFENACIN (VESICARE) 5 MG TABLET    Take 1 tablet (5 mg total) by mouth daily.    Physical Exam: Filed Vitals:   08/13/13 1145  BP: 146/58  Pulse: 60  Temp: 97.3 F (36.3 C)  Resp: 20  Weight: 134 lb (60.782 kg)   Physical Exam  Constitutional: No distress.  HENT:  Head: Normocephalic and atraumatic.  Cardiovascular: Normal rate, regular rhythm, normal heart sounds and intact distal pulses.  Pulmonary/Chest: Effort normal. She has wheezes.  Abdominal: Soft. Bowel sounds are normal. She exhibits no distension and no mass. There is no tenderness.  Musculoskeletal: Normal range of motion.  Neurological: She is alert.  Skin: Skin is warm and dry.     Labs reviewed: Basic Metabolic Panel:  Recent Labs  91/47/8210/07/12 0530 04/30/13 0030 05/01/13 0550  NA 145 144 138  K 4.0 4.1 4.1  CL 106 105 102  CO2 26 27 25   GLUCOSE 81 83 78  BUN 30* 38* 32*  CREATININE 1.22* 1.46* 1.22*  CALCIUM 8.7 8.5 8.2*    Liver Function Tests:  Recent Labs  04/16/13 1447 04/25/13 2130  AST 21 27  ALT 9 11  ALKPHOS 60 51  BILITOT 0.6 0.2*  PROT 6.9 6.9  ALBUMIN  --  3.2*    CBC:  Recent Labs  04/16/13 1447 04/25/13 2130  WBC 12.0* 13.1*    NEUTROABS 8.5* 10.2*  HGB 11.9 10.2*  HCT 34.5 30.6*  MCV 87 90.8  PLT  --  210  05/02/13:  Na 143, K 4.5, BUN 32, cr 1.29, Ca 8.4 05/06/13:  Glucose 73, BUN 38, cr 1.17, Na 134, K 4.2, Ca 8 05/28/13:  Vit D 26.30 (insufficiency)  Significant Diagnostic Results:  05/09/13 CXR:  Old granulomatous disease, minimal cardiomegaly w/o pulmonary vascular congestion or pleural effusion, small patchy areas of atelectasis or intersitiial pneumonitis at lung bases, no inflammatory consolidate or pulmonary nodule  Assessment/Plan 1. Chronic combined systolic and diastolic heart failure Heart healthy nas diet Cont lasix, monitor k  2. Essential hypertension, benign decrease metoprolol to 25mg  po bid due to bradycardia and diastolic hypotension  3. Chronic kidney disease (CKD), stage II (mild) -cont to monitor renal function, avoid nsaids  4. Mixed Alzheimer's and vascular dementia Finished her PT 07/11/13  Now long term care Aspiration precautions Stopped vesicare due to lack of benefit and interaction with aricept (counter effect)   5. Urinary incontinence due to cognitive impairment -vesicare d/cd due to lack of benefit  6. Vitamin d insufficiency:  Start vit D 1000 units daily along with her first dose of calcium -recheck calcium levels   Sees podiatry 09/17/13 [facility] Tylenol for pain, fevers  Family/ staff Communication: discussed with nursing staff  Goals of care: long term care resident now, full code  Labs/tests ordered:  F/u ca

## 2013-09-18 ENCOUNTER — Ambulatory Visit: Payer: Medicare Other | Admitting: Podiatry

## 2013-10-22 ENCOUNTER — Non-Acute Institutional Stay (SKILLED_NURSING_FACILITY): Payer: Medicare Other | Admitting: Internal Medicine

## 2013-10-22 ENCOUNTER — Encounter: Payer: Self-pay | Admitting: Internal Medicine

## 2013-10-22 DIAGNOSIS — M48061 Spinal stenosis, lumbar region without neurogenic claudication: Secondary | ICD-10-CM

## 2013-10-22 DIAGNOSIS — I5042 Chronic combined systolic (congestive) and diastolic (congestive) heart failure: Secondary | ICD-10-CM

## 2013-10-22 DIAGNOSIS — F0280 Dementia in other diseases classified elsewhere without behavioral disturbance: Secondary | ICD-10-CM

## 2013-10-22 DIAGNOSIS — I119 Hypertensive heart disease without heart failure: Secondary | ICD-10-CM

## 2013-10-22 DIAGNOSIS — N182 Chronic kidney disease, stage 2 (mild): Secondary | ICD-10-CM

## 2013-10-22 DIAGNOSIS — G309 Alzheimer's disease, unspecified: Secondary | ICD-10-CM

## 2013-10-22 DIAGNOSIS — F0150 Vascular dementia without behavioral disturbance: Secondary | ICD-10-CM

## 2013-10-22 DIAGNOSIS — E559 Vitamin D deficiency, unspecified: Secondary | ICD-10-CM

## 2013-10-22 DIAGNOSIS — K5900 Constipation, unspecified: Secondary | ICD-10-CM

## 2013-10-22 NOTE — Progress Notes (Signed)
Patient ID: [name], female   DOB: 06-Sep-1919, 80 y.o.   MRN: [id number]  Location:  [facility] Provider:  Gwenith Spitz. Renato Gails, D.O., C.M.D.  Code Status:  FULL CODE  Chief Complaint  Patient presents with  . Medical Management of Chronic Issues    HPI:  80 yo black female with mixed AD and vascular dementia, CKDII, htn, constipation, depression, prior MI, chronic combined systolic and diastolic chf, urinary incontinence, htn, and hallucinations was seen for medical mgt of chronic diseases. She had no complaints today.  She mentions that her left knee sometimes hurts, but not right now.  She was found taking a nap in the dayroom.  Her problem list was reviewed and updated.    Review of Systems:  Review of Systems  Constitutional: Negative for fever.  HENT: Negative for congestion.   Eyes: Negative for blurred vision.  Respiratory: Positive for wheezing. Negative for shortness of breath.   Cardiovascular: Negative for chest pain and leg swelling.  Gastrointestinal: Negative for abdominal pain and constipation.  Genitourinary: Negative for dysuria.  Musculoskeletal: Positive for joint pain. Negative for falls and myalgias.  Skin: Negative for rash.  Neurological: Positive for weakness. Negative for dizziness and loss of consciousness.  Endo/Heme/Allergies: Does not bruise/bleed easily.  Psychiatric/Behavioral: Positive for memory loss. Negative for depression. The patient is not nervous/anxious and does not have insomnia.        Sleeps well most nights    Medications: Patient's Medications  New Prescriptions   No medications on file  Previous Medications   ACETAMINOPHEN (TYLENOL) 325 MG TABLET    Take 650 mg by mouth every 4 (four) hours as needed for mild pain or fever.   ATORVASTATIN (LIPITOR) 10 MG TABLET    Take 1 tablet (10 mg total) by mouth daily at 6 PM.   CALCIUM CARBONATE (TUMS - DOSED IN MG ELEMENTAL CALCIUM) 500 MG CHEWABLE TABLET    Chew 2  tablets by mouth 3 (three) times daily with meals.   CHOLECALCIFEROL (VITAMIN D) 1000 UNITS TABLET    Take 1,000 Units by mouth daily. For osteoporosis   CLOPIDOGREL (PLAVIX) 75 MG TABLET    Take 1 tablet (75 mg total) by mouth daily with breakfast.   DONEPEZIL (ARICEPT) 10 MG TABLET    Take 1 tablet (10 mg total) by mouth at bedtime.   FUROSEMIDE (LASIX) 20 MG TABLET    Take 1 tablet (20 mg total) by mouth daily.   ISOSORBIDE MONONITRATE (IMDUR) 30 MG 24 HR TABLET    Take 1 tablet (30 mg total) by mouth daily.   LISINOPRIL (PRINIVIL,ZESTRIL) 2.5 MG TABLET    Take 1 tablet (2.5 mg total) by mouth daily.   METOPROLOL (LOPRESSOR) 25 MG TABLET    Take 1 tablet (25 mg total) by mouth 2 (two) times daily.   MULTIPLE VITAMIN (MULTIVITAMIN) TABLET    Take 1 tablet by mouth daily.   NYSTATIN-TRIAMCINOLONE (MYCOLOG II) CREAM    APPLY 1 APPLICATION TOPICALLY 2 (TWO) TIMES DAILY.   POLYETHYLENE GLYCOL (MIRALAX / GLYCOLAX) PACKET    Take 17 g by mouth daily as needed for mild constipation.   Modified Medications   No medications on file  Discontinued Medications   No medications on file    Physical Exam: Filed Vitals:   10/22/13 1535  BP: 114/78  Pulse: 78  Temp: 98.4 F (36.9 C)  Resp: 20  Height: 4\' 8"  (1.422 m)  Weight: 143 lb (64.864 kg)  SpO2: 98%   Physical Exam  Constitutional: She appears well-developed and well-nourished. No distress.  Cardiovascular: Normal rate, regular rhythm, normal heart sounds and intact distal pulses.   Pulmonary/Chest: Effort normal and breath sounds normal. No respiratory distress. She has no wheezes.  Abdominal: Soft. Bowel sounds are normal. She exhibits no distension and no mass. There is no tenderness.  Musculoskeletal: Normal range of motion.  Some deformity of right knee, muscle atrophy of LEs  Neurological: She is alert.  Very pleasant  Skin: Skin is warm and dry.  Psychiatric: She has a normal mood and affect.     Labs reviewed: Basic  Metabolic Panel:  Recent Labs  16/02/9611/02/14 0530 04/30/13 0030 05/01/13 0550  NA 145 144 138  K 4.0 4.1 4.1  CL 106 105 102  CO2 26 27 25   GLUCOSE 81 83 78  BUN 30* 38* 32*  CREATININE 1.22* 1.46* 1.22*  CALCIUM 8.7 8.5 8.2*    Liver Function Tests:  Recent Labs  12/24/12 1127 04/16/13 1447 04/25/13 2130  AST 24 21 27   ALT 9 9 11   ALKPHOS 62 60 51  BILITOT 0.5 0.6 0.2*  PROT 6.9 6.9 6.9  ALBUMIN  --   --  3.2*    CBC:  Recent Labs  12/24/12 1127 04/16/13 1447 04/25/13 2130  WBC 8.5 12.0* 13.1*  NEUTROABS 7.1* 8.5* 10.2*  HGB 11.0* 11.9 10.2*  HCT 32.6* 34.5 30.6*  MCV 86 87 90.8  PLT  --   --  210   08/20/13:  Glucose 65, bun 42, cr 1.38, Na 145, K 4.3  10/04/13:  Left ankle wound cx:  Heavy growth of gram negatives--final c+s not in chart  Assessment/Plan 1. Spinal stenosis of lumbar region -no active pain, can receive tylenol if needed  2. Chronic combined systolic and diastolic heart failure -stable, no signs of acute decompensation, no edema, no audible wheezing or dyspnea -cont ace, beta blocker, low dose lasix -f/u bmp  3. Hypertensive heart disease -cont ace, beta blocker, plavix, statin -last lipids reasonable with statin on board--has protective HDL with just over 100 for ldl  4. Constipation -cont miralax as needed  5. Mixed Alzheimer's and vascular dementia -cont statin due to vascular risks and aricept for now due to still having some functions--participates in activities and can feed herself, is sociable resident  6. Chronic kidney disease (CKD), stage II (mild) -f/u bmp due to diuretic, ace use -avoid nsaids  7. Vitamin D insufficiency -cont vitamin D supplement and TUMS  Family/ staff Communication: discussed with nursing staff  Goals of care: long term care resident, is full code  Labs/tests ordered:  Bmp in 1 month

## 2013-12-24 ENCOUNTER — Non-Acute Institutional Stay (SKILLED_NURSING_FACILITY): Payer: Medicare Other | Admitting: Internal Medicine

## 2013-12-24 DIAGNOSIS — F0150 Vascular dementia without behavioral disturbance: Secondary | ICD-10-CM

## 2013-12-24 DIAGNOSIS — I110 Hypertensive heart disease with heart failure: Secondary | ICD-10-CM

## 2013-12-24 DIAGNOSIS — I5042 Chronic combined systolic (congestive) and diastolic (congestive) heart failure: Secondary | ICD-10-CM

## 2013-12-24 DIAGNOSIS — M48061 Spinal stenosis, lumbar region without neurogenic claudication: Secondary | ICD-10-CM

## 2013-12-24 DIAGNOSIS — G309 Alzheimer's disease, unspecified: Secondary | ICD-10-CM

## 2013-12-24 DIAGNOSIS — F0280 Dementia in other diseases classified elsewhere without behavioral disturbance: Secondary | ICD-10-CM

## 2013-12-24 DIAGNOSIS — N182 Chronic kidney disease, stage 2 (mild): Secondary | ICD-10-CM

## 2013-12-24 DIAGNOSIS — I509 Heart failure, unspecified: Secondary | ICD-10-CM

## 2013-12-24 DIAGNOSIS — K5901 Slow transit constipation: Secondary | ICD-10-CM

## 2014-02-18 ENCOUNTER — Encounter: Payer: Self-pay | Admitting: Internal Medicine

## 2014-02-18 ENCOUNTER — Non-Acute Institutional Stay (SKILLED_NURSING_FACILITY): Payer: Medicare Other | Admitting: Internal Medicine

## 2014-02-18 DIAGNOSIS — F0150 Vascular dementia without behavioral disturbance: Secondary | ICD-10-CM

## 2014-02-18 DIAGNOSIS — G309 Alzheimer's disease, unspecified: Secondary | ICD-10-CM

## 2014-02-18 DIAGNOSIS — N182 Chronic kidney disease, stage 2 (mild): Secondary | ICD-10-CM

## 2014-02-18 DIAGNOSIS — I5042 Chronic combined systolic (congestive) and diastolic (congestive) heart failure: Secondary | ICD-10-CM

## 2014-02-18 DIAGNOSIS — I509 Heart failure, unspecified: Secondary | ICD-10-CM

## 2014-02-18 DIAGNOSIS — F0280 Dementia in other diseases classified elsewhere without behavioral disturbance: Secondary | ICD-10-CM

## 2014-02-18 DIAGNOSIS — M48061 Spinal stenosis, lumbar region without neurogenic claudication: Secondary | ICD-10-CM

## 2014-02-18 DIAGNOSIS — I110 Hypertensive heart disease with heart failure: Secondary | ICD-10-CM

## 2014-02-18 DIAGNOSIS — K5901 Slow transit constipation: Secondary | ICD-10-CM

## 2014-02-18 NOTE — Progress Notes (Signed)
Patient ID: [name], female   DOB: Oct 17, 1919, 80 y.o.   MRN: [id number]  Location:  [facility] Provider:  Gwenith Spitz. Renato Gails, D.O., C.M.D.  Code Status:  Full code  Chief Complaint  Patient presents with  . Medical Management of Chronic Issues    HPI:  80 yo long term care resident seen for routine visit.  No complaints.  Staff have no concerns.  Review of Systems:  Review of Systems  Constitutional: Negative for fever.  HENT: Negative for congestion.   Eyes: Negative for blurred vision.  Respiratory: Negative for shortness of breath.   Cardiovascular: Negative for chest pain and leg swelling.  Gastrointestinal: Positive for constipation. Negative for abdominal pain.  Genitourinary: Negative for dysuria.  Musculoskeletal: Positive for back pain. Negative for myalgias and falls.  Skin: Negative for rash.  Neurological: Negative for dizziness.  Psychiatric/Behavioral: Positive for memory loss.    Medications: Patient's Medications  New Prescriptions   No medications on file  Previous Medications   ACETAMINOPHEN (TYLENOL) 325 MG TABLET    Take 650 mg by mouth every 4 (four) hours as needed for mild pain or fever.   ATORVASTATIN (LIPITOR) 10 MG TABLET    Take 1 tablet (10 mg total) by mouth daily at 6 PM.   CALCIUM CARBONATE (TUMS - DOSED IN MG ELEMENTAL CALCIUM) 500 MG CHEWABLE TABLET    Chew 2 tablets by mouth 3 (three) times daily with meals.   CHOLECALCIFEROL (VITAMIN D) 1000 UNITS TABLET    Take 1,000 Units by mouth daily. For osteoporosis   CLOPIDOGREL (PLAVIX) 75 MG TABLET    Take 1 tablet (75 mg total) by mouth daily with breakfast.   DONEPEZIL (ARICEPT) 10 MG TABLET    Take 1 tablet (10 mg total) by mouth at bedtime.   FUROSEMIDE (LASIX) 20 MG TABLET    Take 1 tablet (20 mg total) by mouth daily.   ISOSORBIDE MONONITRATE (IMDUR) 30 MG 24 HR TABLET    Take 1 tablet (30 mg total) by mouth daily.   LISINOPRIL (PRINIVIL,ZESTRIL) 2.5 MG TABLET    Take  1 tablet (2.5 mg total) by mouth daily.   MAGNESIUM HYDROXIDE (MILK OF MAGNESIA) 400 MG/5ML SUSPENSION    Take 30 mLs by mouth daily as needed for mild constipation.   METOPROLOL (LOPRESSOR) 25 MG TABLET    Take 1 tablet (25 mg total) by mouth 2 (two) times daily.   MULTIPLE VITAMIN (MULTIVITAMIN) TABLET    Take 1 tablet by mouth daily.   NYSTATIN-TRIAMCINOLONE (MYCOLOG II) CREAM    APPLY 1 APPLICATION TOPICALLY 2 (TWO) TIMES DAILY.   POLYETHYLENE GLYCOL (MIRALAX / GLYCOLAX) PACKET    Take 17 g by mouth daily as needed for mild constipation.    SOLIFENACIN (VESICARE) 5 MG TABLET    Take 5 mg by mouth daily.  Modified Medications   No medications on file  Discontinued Medications   No medications on file    Physical Exam: Filed Vitals:   02/18/14 1133  BP: 120/68  Pulse: 66  Temp: 98.3 F (36.8 C)  Resp: 18  Height:  (1.422 m)  Weight: 135 lb (61.236 kg)  SpO2: 95%  Physical Exam  Constitutional: No distress.  Cardiovascular: Normal rate, regular rhythm, normal heart sounds and intact distal pulses.   Pulmonary/Chest: Effort normal and breath sounds normal. No respiratory distress. She has no rales.  Abdominal: Soft. Bowel sounds are normal. She exhibits no distension and no mass. There is  no tenderness.  Musculoskeletal: Normal range of motion.  Neurological: She is alert.  Skin: Skin is warm and dry.  Psychiatric: She has a normal mood and affect.    Labs reviewed: Basic Metabolic Panel:  Recent Labs  40/98/11 0530 04/30/13 0030 05/01/13 0550  NA 145 144 138  K 4.0 4.1 4.1  CL 106 105 102  CO2 GLUCOSE 81 83 78  BUN 30* 38* 32*  CREATININE 1.22* 1.46* 1.22*  CALCIUM 8.7 8.5 8.2*    Liver Function Tests:  Recent Labs  04/16/13 1447 04/25/13 2130  AST 21 27  ALT 9 11  ALKPHOS 60 51  BILITOT 0.6 0.2*  PROT 6.9 6.9  ALBUMIN  --  3.2*    CBC:  Recent Labs  04/16/13 1447 04/25/13 2130  WBC 12.0* 13.1*  NEUTROABS 8.5* 10.2*  HGB 11.9  10.2*  HCT 34.5 30.6*  MCV 87 90.8  PLT  --  210     Assessment/Plan 1. Spinal stenosis of lumbar region -stable, cont tylenol   2. Chronic combined systolic and diastolic heart failure -stable, cont lasix, lopressor, lisinopril  3. Hypertensive heart disease with heart failure -stable, cont plavix, lasix, lopressor, lisinopril, imdur  4. Slow transit constipation -stable, cont miralax  5. Mixed Alzheimer's and vascular dementia -gradually progressive, continues on aricept, is not on namenda  6. Chronic kidney disease (CKD), stage II (mild) -cont to avoid nsaids and other nephrotoxic meds  heart healthy diet mechanical soft, strict aspiration precautions  Family/ staff Communication: discussed with nurse  Goals of care: long term care

## 2014-02-28 ENCOUNTER — Encounter: Payer: Self-pay | Admitting: Internal Medicine

## 2014-04-22 ENCOUNTER — Non-Acute Institutional Stay (SKILLED_NURSING_FACILITY): Payer: Medicare Other | Admitting: Internal Medicine

## 2014-04-22 ENCOUNTER — Encounter: Payer: Self-pay | Admitting: Internal Medicine

## 2014-04-22 DIAGNOSIS — N182 Chronic kidney disease, stage 2 (mild): Secondary | ICD-10-CM | POA: Diagnosis not present

## 2014-04-22 DIAGNOSIS — G309 Alzheimer's disease, unspecified: Secondary | ICD-10-CM

## 2014-04-22 DIAGNOSIS — M4806 Spinal stenosis, lumbar region: Secondary | ICD-10-CM | POA: Diagnosis not present

## 2014-04-22 DIAGNOSIS — K5901 Slow transit constipation: Secondary | ICD-10-CM

## 2014-04-22 DIAGNOSIS — F0150 Vascular dementia without behavioral disturbance: Secondary | ICD-10-CM | POA: Diagnosis not present

## 2014-04-22 DIAGNOSIS — I509 Heart failure, unspecified: Secondary | ICD-10-CM | POA: Diagnosis not present

## 2014-04-22 DIAGNOSIS — F0280 Dementia in other diseases classified elsewhere without behavioral disturbance: Secondary | ICD-10-CM

## 2014-04-22 DIAGNOSIS — I110 Hypertensive heart disease with heart failure: Secondary | ICD-10-CM

## 2014-04-22 DIAGNOSIS — I5042 Chronic combined systolic (congestive) and diastolic (congestive) heart failure: Secondary | ICD-10-CM | POA: Diagnosis not present

## 2014-04-22 DIAGNOSIS — M48061 Spinal stenosis, lumbar region without neurogenic claudication: Secondary | ICD-10-CM

## 2014-04-22 NOTE — Progress Notes (Signed)
Patient ID: Briana LuzGladys J Hubbard, female   DOB: 07/27/1919, 80 y.o.   MRN: [id number]  Location:  [facility] Provider:  Gwenith Spitziffany L. Renato Gailseed, D.O., C.M.D.  Code Status:  Full code, does not have advance directive in SNF chart  Chief Complaint  Patient presents with  . Medical Management of Chronic Issues    HPI:  80 yo long term care resident seen for med mgt of chronic diseases.  She has a h/o combined chf, unstable angina, dementia w/o behaviors (likelly vascular), hyperlipidemia, anxiety, htn, lumbar spinal stenosis, and CKD2. She is unable to provide any history.  Staff have no concerns either.  She needs labs.  She is not maximized on her dementia meds.  Review of Systems:  Review of Systems  Constitutional: Negative for fever and chills.  Respiratory: Negative for shortness of breath.   Cardiovascular: Negative for chest pain.  Gastrointestinal: Negative for abdominal pain.  Genitourinary: Negative for dysuria.  Musculoskeletal: Positive for back pain. Negative for myalgias.  Psychiatric/Behavioral: Positive for memory loss.    Medications: Patient's Medications  New Prescriptions   No medications on file  Previous Medications   ACETAMINOPHEN (TYLENOL) 325 MG TABLET    Take 650 mg by mouth every 4 (four) hours as needed for mild pain or fever.   ATORVASTATIN (LIPITOR) 10 MG TABLET    Take 1 tablet (10 mg total) by mouth daily at 6 PM.   CALCIUM CARBONATE (TUMS - DOSED IN MG ELEMENTAL CALCIUM) 500 MG CHEWABLE TABLET    Chew 2 tablets by mouth 3 (three) times daily with meals.   CHOLECALCIFEROL (VITAMIN D) 1000 UNITS TABLET    Take 1,000 Units by mouth daily. For osteoporosis   CLOPIDOGREL (PLAVIX) 75 MG TABLET    Take 1 tablet (75 mg total) by mouth daily with breakfast.   DONEPEZIL (ARICEPT) 10 MG TABLET    Take 1 tablet (10 mg total) by mouth at bedtime.   FUROSEMIDE (LASIX) 20 MG TABLET    Take 1 tablet (20 mg total) by mouth daily.   ISOSORBIDE MONONITRATE  (IMDUR) 30 MG 24 HR TABLET    Take 1 tablet (30 mg total) by mouth daily.   LISINOPRIL (PRINIVIL,ZESTRIL) 2.5 MG TABLET    Take 1 tablet (2.5 mg total) by mouth daily.   MAGNESIUM HYDROXIDE (MILK OF MAGNESIA) 400 MG/5ML SUSPENSION    Take 30 mLs by mouth daily as needed for mild constipation.   METOPROLOL (LOPRESSOR) 25 MG TABLET    Take 1 tablet (25 mg total) by mouth 2 (two) times daily.   MULTIPLE VITAMIN (MULTIVITAMIN) TABLET    Take 1 tablet by mouth daily.   NYSTATIN-TRIAMCINOLONE (MYCOLOG II) CREAM    APPLY 1 APPLICATION TOPICALLY 2 (TWO) TIMES DAILY.   POLYETHYLENE GLYCOL (MIRALAX / GLYCOLAX) PACKET    Take 17 g by mouth daily as needed for mild constipation.    SOLIFENACIN (VESICARE) 5 MG TABLET    Take 5 mg by mouth daily.  Modified Medications   No medications on file  Discontinued Medications   No medications on file    Physical Exam: Filed Vitals:   04/22/14 1537  BP: 115/64  Pulse: 64  Temp: 98.6 F (37 C)  Resp: 20  Height: 4\' 8"  (1.422 m)  Weight: 134 lb (60.782 kg)  SpO2: 91%   Physical Exam  Constitutional: No distress.  Cardiovascular: Normal rate, regular rhythm and normal heart sounds.   Pulmonary/Chest: Effort normal and breath sounds normal.  Abdominal:  Soft. Bowel sounds are normal.  Musculoskeletal: Normal range of motion.  Uses wheelchair to move throughout building  Neurological: She is alert.  Oriented to person only  Skin: Skin is warm and dry.     Labs reviewed: Basic Metabolic Panel:  Recent Labs  16/02/9611/02/14 0530 04/30/13 0030 05/01/13 0550  NA 145 144 138  K 4.0 4.1 4.1  CL 106 105 102  CO2 26 27 25   GLUCOSE 81 83 78  BUN 30* 38* 32*  CREATININE 1.22* 1.46* 1.22*  CALCIUM 8.7 8.5 8.2*    Liver Function Tests:  Recent Labs  04/25/13 2130  AST 27  ALT 11  ALKPHOS 51  BILITOT 0.2*  PROT 6.9  ALBUMIN 3.2*    CBC:  Recent Labs  04/25/13 2130  WBC 13.1*  NEUTROABS 10.2*  HGB 10.2*  HCT 30.6*  MCV 90.8  PLT 210    last labs in chart 5/15  Assessment/Plan 1. Mixed Alzheimer's and vascular dementia -will maximize dementia meds:  Add namenda XR and titrate to 28mg , then change aricept and namenda XR to namzaric to decrease pill burden  2. Spinal stenosis of lumbar region -cont tylenol prn for low back pain--if she becomes agitated, pain should be assessed--may require stronger medication  3. Chronic combined systolic and diastolic heart failure -cont lasix 20mg  daily, lisinopril, lopressor, imdur  4. Hypertensive heart disease with heart failure -at goal with lopressor, lisinopril, imdur, cont plavix and lipitor also--f/u cmp, flp  5. Slow transit constipation -cont mom as needed and miralax as needed  6. Chronic kidney disease (CKD), stage II (mild) -avoid nephrotoxic agents like nsaids and hold lasix and lisinopril if she develops infection or intake is poor -f/u cmp, cbc  Family/ staff Communication: discussed with nursing staff  Goals of care: needs assistance with bathing, dressing  Labs/tests ordered:  Cbc, cmp, flp

## 2014-05-19 ENCOUNTER — Encounter: Payer: Self-pay | Admitting: Internal Medicine

## 2014-05-19 NOTE — Progress Notes (Signed)
Patient ID: [name], female   DOB: 07/27/1919, 80 y.o.   MRN: [id number]  Location:  [facility] Provider:  Gwenith Spitziffany L. Renato Gailseed, D.O., C.M.D.  Code Status:  FULL CODE  Chief Complaint  Patient presents with  . Medical Management of Chronic Issues    HPI:  80 yo black female with mixed AD and vascular dementia, CKDII, htn, constipation, depression, prior MI, chronic combined systolic and diastolic chf, urinary incontinence, htn, and hallucinations was seen for medical mgt of chronic diseases. She had no complaints today.    Review of Systems:  Review of Systems  Constitutional: Negative for fever.  HENT: Negative for congestion.   Eyes: Negative for blurred vision.  Respiratory: Positive for wheezing. Negative for shortness of breath.   Cardiovascular: Negative for chest pain and leg swelling.  Gastrointestinal: Negative for abdominal pain and constipation.  Genitourinary: Negative for dysuria.  Musculoskeletal: Positive for joint pain. Negative for myalgias and falls.  Skin: Negative for rash.  Neurological: Positive for weakness. Negative for dizziness and loss of consciousness.  Endo/Heme/Allergies: Does not bruise/bleed easily.  Psychiatric/Behavioral: Positive for memory loss. Negative for depression. The patient is not nervous/anxious and does not have insomnia.        Sleeps well most nights    Medications: Patient's Medications  New Prescriptions   No medications on file  Previous Medications   ACETAMINOPHEN (TYLENOL) 325 MG TABLET    Take 650 mg by mouth every 4 (four) hours as needed for mild pain or fever.   ATORVASTATIN (LIPITOR) 10 MG TABLET    Take 1 tablet (10 mg total) by mouth daily at 6 PM.   CALCIUM CARBONATE (TUMS - DOSED IN MG ELEMENTAL CALCIUM) 500 MG CHEWABLE TABLET    Chew 2 tablets by mouth 3 (three) times daily with meals.   CHOLECALCIFEROL (VITAMIN D) 1000 UNITS TABLET    Take 1,000 Units by mouth daily. For osteoporosis   CLOPIDOGREL (PLAVIX) 75 MG TABLET    Take 1 tablet (75 mg total) by mouth daily with breakfast.   DONEPEZIL (ARICEPT) 10 MG TABLET    Take 1 tablet (10 mg total) by mouth at bedtime.   FUROSEMIDE (LASIX) 20 MG TABLET    Take 1 tablet (20 mg total) by mouth daily.   ISOSORBIDE MONONITRATE (IMDUR) 30 MG 24 HR TABLET    Take 1 tablet (30 mg total) by mouth daily.   LISINOPRIL (PRINIVIL,ZESTRIL) 2.5 MG TABLET    Take 1 tablet (2.5 mg total) by mouth daily.   MAGNESIUM HYDROXIDE (MILK OF MAGNESIA) 400 MG/5ML SUSPENSION    Take 30 mLs by mouth daily as needed for mild constipation.   METOPROLOL (LOPRESSOR) 25 MG TABLET    Take 1 tablet (25 mg total) by mouth 2 (two) times daily.   MULTIPLE VITAMIN (MULTIVITAMIN) TABLET    Take 1 tablet by mouth daily.   NYSTATIN-TRIAMCINOLONE (MYCOLOG II) CREAM    APPLY 1 APPLICATION TOPICALLY 2 (TWO) TIMES DAILY.   POLYETHYLENE GLYCOL (MIRALAX / GLYCOLAX) PACKET    Take 17 g by mouth daily as needed for mild constipation.    SOLIFENACIN (VESICARE) 5 MG TABLET    Take 5 mg by mouth daily.  Modified Medications   No medications on file  Discontinued Medications   No medications on file    Physical Exam: Filed Vitals:   12/24/13 1107  BP: 121/59  Pulse: 65  Temp: 98.7 F (37.1 C)  Resp: 18  Height: 4\' 8"  (1.422  m)  Weight: 142 lb (64.411 kg)  SpO2: 92%   Physical Exam  Constitutional: She appears well-developed and well-nourished. No distress.  Cardiovascular: Normal rate, regular rhythm, normal heart sounds and intact distal pulses.   Pulmonary/Chest: Effort normal and breath sounds normal. No respiratory distress. She has no wheezes.  Abdominal: Soft. Bowel sounds are normal. She exhibits no distension and no mass. There is no tenderness.  Musculoskeletal: Normal range of motion.  Some deformity of right knee, muscle atrophy of LEs  Neurological: She is alert.  Very pleasant  Skin: Skin is warm and dry.  Psychiatric: She has a normal mood and affect.      Labs reviewed: 08/20/13:  Glucose 65, bun 42, cr 1.38, Na 145, K 4.3  10/04/13:  Left ankle wound cx:  Heavy growth of gram negatives--final c+s not in chart  Assessment/Plan 1. Spinal stenosis of lumbar region -no active pain, can receive tylenol if needed  2. Chronic combined systolic and diastolic heart failure -stable -cont ace, beta blocker, low dose lasix   3. Hypertensive heart disease -cont ace, beta blocker, plavix, statin -last lipids reasonable with statin on board  4. Constipation -cont miralax as needed  5. Mixed Alzheimer's and vascular dementia -cont statin due to vascular risks and aricept for now due to still having some functions  6. Chronic kidney disease (CKD), stage II (mild) -avoid nsaids and nephrotoxic meds  7. Vitamin D insufficiency -cont vitamin D supplement and TUMS  Family/ staff Communication: discussed with nursing staff  Goals of care: long term care resident, is full code

## 2014-06-03 ENCOUNTER — Non-Acute Institutional Stay (SKILLED_NURSING_FACILITY): Payer: Medicare Other | Admitting: Adult Health

## 2014-06-03 DIAGNOSIS — G309 Alzheimer's disease, unspecified: Secondary | ICD-10-CM

## 2014-06-03 DIAGNOSIS — E785 Hyperlipidemia, unspecified: Secondary | ICD-10-CM

## 2014-06-03 DIAGNOSIS — I110 Hypertensive heart disease with heart failure: Secondary | ICD-10-CM

## 2014-06-03 DIAGNOSIS — I509 Heart failure, unspecified: Secondary | ICD-10-CM

## 2014-06-03 DIAGNOSIS — R32 Unspecified urinary incontinence: Secondary | ICD-10-CM

## 2014-06-03 DIAGNOSIS — E559 Vitamin D deficiency, unspecified: Secondary | ICD-10-CM

## 2014-06-03 DIAGNOSIS — F0280 Dementia in other diseases classified elsewhere without behavioral disturbance: Secondary | ICD-10-CM

## 2014-06-03 DIAGNOSIS — I5042 Chronic combined systolic (congestive) and diastolic (congestive) heart failure: Secondary | ICD-10-CM

## 2014-06-03 DIAGNOSIS — F0150 Vascular dementia without behavioral disturbance: Secondary | ICD-10-CM

## 2014-06-04 ENCOUNTER — Encounter: Payer: Self-pay | Admitting: Adult Health

## 2014-06-04 DIAGNOSIS — R32 Unspecified urinary incontinence: Secondary | ICD-10-CM | POA: Insufficient documentation

## 2014-06-04 NOTE — Progress Notes (Signed)
Patient ID: Briana LuzGladys J Hubbard, female   DOB: 07/27/1919, 81 y.o.   MRN: [id number]  [facility]     No Known Allergies     Chief Complaint  Patient presents with  . Medical Management of Chronic Issues    HPI:  She is a long term resident of this facility being seen for the management of her chronic illnesses. Overall her status remains without change. She is is not voicing any concerns today. There are no nursing concerns being voiced today.    Past Medical History  Diagnosis Date  . Frequency of urination   . Incontinence   . Constipation   . Hypertension   . COPD (chronic obstructive pulmonary disease)   . Pneumonia   . Dementia   . Spinal stenosis     Past Surgical History  Procedure Laterality Date  . Colonoscopy  1991    VITAL SIGNS BP 116/62 mmHg  Pulse 61  Ht 4\' 8"  (1.422 m)  Wt 138 lb (62.596 kg)  BMI 30.96 kg/m2  SpO2 96%   Outpatient Encounter Prescriptions as of 06/03/2014  Medication Sig  . acetaminophen (TYLENOL) 325 MG tablet Take 650 mg by mouth every 4 (four) hours as needed for mild pain or fever.  Marland Kitchen. atorvastatin (LIPITOR) 10 MG tablet Take 1 tablet (10 mg total) by mouth daily at 6 PM.  . calcium carbonate (TUMS - DOSED IN MG ELEMENTAL CALCIUM) 500 MG chewable tablet Chew 2 tablets by mouth 3 (three) times daily with meals.  . cholecalciferol (VITAMIN D) 1000 UNITS tablet Take 1,000 Units by mouth daily. For osteoporosis  . clopidogrel (PLAVIX) 75 MG tablet Take 1 tablet (75 mg total) by mouth daily with breakfast.  . donepezil (ARICEPT) 10 MG tablet Take 1 tablet (10 mg total) by mouth at bedtime.  . furosemide (LASIX) 20 MG tablet Take 1 tablet (20 mg total) by mouth daily.  . isosorbide mononitrate (IMDUR) 30 MG 24 hr tablet Take 1 tablet (30 mg total) by mouth daily.  Marland Kitchen. lisinopril (PRINIVIL,ZESTRIL) 2.5 MG tablet Take 1 tablet (2.5 mg total) by mouth daily.  . magnesium hydroxide (MILK OF MAGNESIA) 400 MG/5ML suspension Take 30 mLs by mouth  daily as needed for mild constipation.  . metoprolol (LOPRESSOR) 25 MG tablet Take 1 tablet (25 mg total) by mouth 2 (two) times daily.  . Multiple Vitamin (MULTIVITAMIN) tablet Take 1 tablet by mouth daily.  Marland Kitchen. nystatin-triamcinolone (MYCOLOG II) cream APPLY 1 APPLICATION TOPICALLY 2 (TWO) TIMES DAILY.  Marland Kitchen. polyethylene glycol (MIRALAX / GLYCOLAX) packet Take 17 g by mouth daily as needed for mild constipation.   . solifenacin (VESICARE) 5 MG tablet Take 5 mg by mouth daily.     SIGNIFICANT DIAGNOSTIC EXAMS   LABS REVIEWED:   04-23-14: wbc 3.9; hgb 15.8; hct 51.6; mcv 09.7; plt 112 glucose 78; bun 40.6; creat 1.54; k+4.0; na+143; liver normal albumin 3.1; ca++8.4; chol 98; ldl 41; trig 45; hdl 48     Review of Systems  Constitutional: Positive for malaise/fatigue.  Respiratory: Negative for cough and shortness of breath.   Cardiovascular: Negative for chest pain, palpitations and leg swelling.  Gastrointestinal: Negative for heartburn, abdominal pain and constipation.  Musculoskeletal: Negative for myalgias and joint pain.  Skin: Negative.   Psychiatric/Behavioral: Negative for depression. The patient is not nervous/anxious.      Physical Exam  Constitutional: No distress.  Thin   Neck: Neck supple. No JVD present. No thyromegaly present.  Cardiovascular: Normal rate, regular rhythm and  intact distal pulses.   Respiratory: Effort normal and breath sounds normal. No respiratory distress.  GI: Soft. Bowel sounds are normal. She exhibits no distension. There is no tenderness.  Musculoskeletal: She exhibits no edema.  Is able to move all extremities   Neurological: She is alert.  Skin: Skin is warm and dry. She is not diaphoretic.       ASSESSMENT/ PLAN:  1. Combined systolic and diastolic heart failure: is stable will continue lasix 20 mg daily; and lopressor 25 mg twice daily   2. Hypertension: is stable will continue lisinopril 2.5 mg daily; lopressor 25 mg twice daily     3. Hypertensive heart disease: she is presently stable; she is not complaining of chest pain at this time; will continue imdur 30 mg daily; plavix 75 mg daily   4. Dyslipidemia; her ldl is 41; will stop her lipitor at this time will monitor  5. Mixed Alzheimer's disease and vascular dementia: is without change; will continue aricept 10 mg daily; and namenda xr 28 mg daily   6. Vit d def: will continue vit d 100 units ca++ 1 gm three times daily   7. UI: will continue vesicare 5 mg daily     Synthia Innocent NP [facility]  Contact [phone] Monday through Friday 8am- 5pm  After hours call [phone]

## 2014-06-22 DIAGNOSIS — M4806 Spinal stenosis, lumbar region: Secondary | ICD-10-CM

## 2014-06-22 DIAGNOSIS — M19041 Primary osteoarthritis, right hand: Secondary | ICD-10-CM | POA: Diagnosis not present

## 2014-06-22 DIAGNOSIS — M19042 Primary osteoarthritis, left hand: Secondary | ICD-10-CM | POA: Diagnosis not present

## 2014-06-22 DIAGNOSIS — I5042 Chronic combined systolic (congestive) and diastolic (congestive) heart failure: Secondary | ICD-10-CM

## 2014-06-22 DIAGNOSIS — M19031 Primary osteoarthritis, right wrist: Secondary | ICD-10-CM | POA: Diagnosis not present

## 2014-06-22 DIAGNOSIS — F0390 Unspecified dementia without behavioral disturbance: Secondary | ICD-10-CM | POA: Diagnosis not present

## 2014-06-22 DIAGNOSIS — M170 Bilateral primary osteoarthritis of knee: Secondary | ICD-10-CM

## 2014-06-22 DIAGNOSIS — M6281 Muscle weakness (generalized): Secondary | ICD-10-CM

## 2014-06-29 ENCOUNTER — Inpatient Hospital Stay (HOSPITAL_COMMUNITY)
Admission: EM | Admit: 2014-06-29 | Discharge: 2014-07-03 | DRG: 683 | Disposition: A | Discharge status: Skilled Nursing Facility | Payer: Medicare Other | Attending: Internal Medicine | Admitting: Internal Medicine

## 2014-06-29 ENCOUNTER — Encounter (HOSPITAL_COMMUNITY): Payer: Self-pay | Admitting: Emergency Medicine

## 2014-06-29 ENCOUNTER — Emergency Department (HOSPITAL_COMMUNITY): Payer: Medicare Other

## 2014-06-29 DIAGNOSIS — R531 Weakness: Secondary | ICD-10-CM | POA: Diagnosis not present

## 2014-06-29 DIAGNOSIS — F0280 Dementia in other diseases classified elsewhere without behavioral disturbance: Secondary | ICD-10-CM | POA: Diagnosis present

## 2014-06-29 DIAGNOSIS — M4800 Spinal stenosis, site unspecified: Secondary | ICD-10-CM | POA: Diagnosis present

## 2014-06-29 DIAGNOSIS — E860 Dehydration: Secondary | ICD-10-CM | POA: Diagnosis present

## 2014-06-29 DIAGNOSIS — Z87891 Personal history of nicotine dependence: Secondary | ICD-10-CM

## 2014-06-29 DIAGNOSIS — N289 Disorder of kidney and ureter, unspecified: Secondary | ICD-10-CM

## 2014-06-29 DIAGNOSIS — I129 Hypertensive chronic kidney disease with stage 1 through stage 4 chronic kidney disease, or unspecified chronic kidney disease: Secondary | ICD-10-CM | POA: Diagnosis present

## 2014-06-29 DIAGNOSIS — N182 Chronic kidney disease, stage 2 (mild): Secondary | ICD-10-CM

## 2014-06-29 DIAGNOSIS — F0390 Unspecified dementia without behavioral disturbance: Secondary | ICD-10-CM

## 2014-06-29 DIAGNOSIS — L89621 Pressure ulcer of left heel, stage 1: Secondary | ICD-10-CM | POA: Diagnosis present

## 2014-06-29 DIAGNOSIS — I509 Heart failure, unspecified: Secondary | ICD-10-CM

## 2014-06-29 DIAGNOSIS — G309 Alzheimer's disease, unspecified: Secondary | ICD-10-CM | POA: Diagnosis present

## 2014-06-29 DIAGNOSIS — D649 Anemia, unspecified: Secondary | ICD-10-CM | POA: Diagnosis present

## 2014-06-29 DIAGNOSIS — Z7902 Long term (current) use of antithrombotics/antiplatelets: Secondary | ICD-10-CM

## 2014-06-29 DIAGNOSIS — E785 Hyperlipidemia, unspecified: Secondary | ICD-10-CM

## 2014-06-29 DIAGNOSIS — J449 Chronic obstructive pulmonary disease, unspecified: Secondary | ICD-10-CM | POA: Diagnosis present

## 2014-06-29 DIAGNOSIS — F0150 Vascular dementia without behavioral disturbance: Secondary | ICD-10-CM | POA: Diagnosis present

## 2014-06-29 DIAGNOSIS — N179 Acute kidney failure, unspecified: Secondary | ICD-10-CM | POA: Diagnosis not present

## 2014-06-29 DIAGNOSIS — A047 Enterocolitis due to Clostridium difficile: Secondary | ICD-10-CM | POA: Diagnosis present

## 2014-06-29 DIAGNOSIS — R609 Edema, unspecified: Secondary | ICD-10-CM

## 2014-06-29 DIAGNOSIS — R627 Adult failure to thrive: Secondary | ICD-10-CM | POA: Diagnosis present

## 2014-06-29 DIAGNOSIS — R0602 Shortness of breath: Secondary | ICD-10-CM

## 2014-06-29 DIAGNOSIS — I5042 Chronic combined systolic (congestive) and diastolic (congestive) heart failure: Secondary | ICD-10-CM

## 2014-06-29 HISTORY — DX: Heart failure, unspecified: I50.9

## 2014-06-29 LAB — I-STAT CHEM 8, ED
BUN: 97 mg/dL — ABNORMAL HIGH (ref 6–23)
Calcium, Ion: 1.16 mmol/L (ref 1.13–1.30)
Chloride: 106 mmol/L (ref 96–112)
Creatinine, Ser: 2.6 mg/dL — ABNORMAL HIGH (ref 0.50–1.10)
Glucose, Bld: 97 mg/dL (ref 70–99)
HCT: 29 % — ABNORMAL LOW (ref 36.0–46.0)
Hemoglobin: 9.9 g/dL — ABNORMAL LOW (ref 12.0–15.0)
Potassium: 3.7 mmol/L (ref 3.5–5.1)
SODIUM: 138 mmol/L (ref 135–145)
TCO2: 16 mmol/L (ref 0–100)

## 2014-06-29 LAB — CBC WITH DIFFERENTIAL/PLATELET
Basophils Absolute: 0 10*3/uL (ref 0.0–0.1)
Basophils Relative: 0 % (ref 0–1)
EOS ABS: 0 10*3/uL (ref 0.0–0.7)
Eosinophils Relative: 0 % (ref 0–5)
HCT: 28.3 % — ABNORMAL LOW (ref 36.0–46.0)
Hemoglobin: 9.4 g/dL — ABNORMAL LOW (ref 12.0–15.0)
LYMPHS PCT: 11 % — AB (ref 12–46)
Lymphs Abs: 1.1 10*3/uL (ref 0.7–4.0)
MCH: 28.1 pg (ref 26.0–34.0)
MCHC: 33.2 g/dL (ref 30.0–36.0)
MCV: 84.7 fL (ref 78.0–100.0)
Monocytes Absolute: 0.8 10*3/uL (ref 0.1–1.0)
Monocytes Relative: 8 % (ref 3–12)
NEUTROS ABS: 8 10*3/uL — AB (ref 1.7–7.7)
Neutrophils Relative %: 81 % — ABNORMAL HIGH (ref 43–77)
Platelets: 230 10*3/uL (ref 150–400)
RBC: 3.34 MIL/uL — ABNORMAL LOW (ref 3.87–5.11)
RDW: 14.4 % (ref 11.5–15.5)
WBC: 9.9 10*3/uL (ref 4.0–10.5)

## 2014-06-29 LAB — BRAIN NATRIURETIC PEPTIDE: B NATRIURETIC PEPTIDE 5: 181.4 pg/mL — AB (ref 0.0–100.0)

## 2014-06-29 MED ORDER — IPRATROPIUM-ALBUTEROL 0.5-2.5 (3) MG/3ML IN SOLN
3.0000 mL | Freq: Once | RESPIRATORY_TRACT | Status: AC
  Administered 2014-06-29: 3 mL via RESPIRATORY_TRACT
  Filled 2014-06-29: qty 3

## 2014-06-29 NOTE — ED Provider Notes (Addendum)
CSN: [id number]     Arrival date & time 06/29/14  1953 History   First MD Initiated Contact with Patient 06/29/14 2038     Chief Complaint  Patient presents with  . Leg Swelling     (Consider location/radiation/quality/duration/timing/severity/associated sxs/prior Treatment) HPI Comments: Patient was recently discharged from the nursing home to her daughter's apartment.  Unfortunately very little preparation was made for this patient's care, and therefore difficult to get her around to get her to her appointments.  She presents now with increased swelling of her lower extremities moist .  Respirations. Daughter states patient has been eating well, not having any trouble with constipation.  She been urinating in an adult diaper. Daughter states that the mother has been taking her medication as prescribed  The history is provided by the patient, a relative and a caregiver.    Past Medical History  Diagnosis Date  . Frequency of urination   . Incontinence   . Constipation   . Hypertension   . COPD (chronic obstructive pulmonary disease)   . Pneumonia   . Dementia   . Spinal stenosis   . CHF (congestive heart failure)    Past Surgical History  Procedure Laterality Date  . Colonoscopy  1991   Family History  Problem Relation Age of Onset  . Cancer Brother   . Cancer Son    History  Substance Use Topics  . Smoking status: Former Smoker    Quit date: 09/27/1978  . Smokeless tobacco: Not on file  . Alcohol Use: No   OB History    No data available     Review of Systems  Constitutional: Negative for fever.  Respiratory: Positive for wheezing. Negative for cough.   Cardiovascular: Positive for leg swelling.  Skin: Negative for rash.  All other systems reviewed and are negative.     Allergies  Review of patient's allergies indicates no known allergies.  Home Medications   Prior to Admission medications   Medication Sig Start Date End Date Taking? Authorizing  Provider  acetaminophen (TYLENOL) 325 MG tablet Take 650 mg by mouth every 4 (four) hours as needed for mild pain or fever.    Historical Provider, MD  calcium carbonate (TUMS - DOSED IN MG ELEMENTAL CALCIUM) 500 MG chewable tablet Chew 2 tablets by mouth 3 (three) times daily with meals.    Historical Provider, MD  cholecalciferol (VITAMIN D) 1000 UNITS tablet Take 1,000 Units by mouth daily. For osteoporosis    Historical Provider, MD  clopidogrel (PLAVIX) 75 MG tablet Take 1 tablet (75 mg total) by mouth daily with breakfast. 04/30/13   Vassie Loll, MD  donepezil (ARICEPT) 10 MG tablet Take 1 tablet (10 mg total) by mouth at bedtime. 04/22/13   Oneal Grout, MD  furosemide (LASIX) 20 MG tablet Take 1 tablet (20 mg total) by mouth daily. 04/30/13   Vassie Loll, MD  isosorbide mononitrate (IMDUR) 30 MG 24 hr tablet Take 1 tablet (30 mg total) by mouth daily. 04/30/13   Vassie Loll, MD  lisinopril (PRINIVIL,ZESTRIL) 2.5 MG tablet Take 1 tablet (2.5 mg total) by mouth daily. 04/30/13   Vassie Loll, MD  magnesium hydroxide (MILK OF MAGNESIA) 400 MG/5ML suspension Take 30 mLs by mouth daily as needed for mild constipation.    Historical Provider, MD  metoprolol (LOPRESSOR) 25 MG tablet Take 1 tablet (25 mg total) by mouth 2 (two) times daily. 08/13/13   Tiffany L Reed, DO  Multiple Vitamin (MULTIVITAMIN) tablet Take 1 tablet  by mouth daily.    Historical Provider, MD  nystatin-triamcinolone (MYCOLOG II) cream APPLY 1 APPLICATION TOPICALLY 2 (TWO) TIMES DAILY. 04/25/13   Oneal GroutMahima Pandey, MD  polyethylene glycol (MIRALAX / GLYCOLAX) packet Take 17 g by mouth daily as needed for mild constipation.     Historical Provider, MD  solifenacin (VESICARE) 5 MG tablet Take 5 mg by mouth daily.    Historical Provider, MD   Pulse 67  Temp(Src) 97.6 F (36.4 C) (Oral)  Resp 15  SpO2 99% Physical Exam  Constitutional: She appears well-developed and well-nourished. No distress.  HENT:  Head: Normocephalic.   Eyes: Pupils are equal, round, and reactive to light.  Neck: Normal range of motion.  Cardiovascular: Normal rate and regular rhythm.   Pulmonary/Chest: Effort normal and breath sounds normal.  Abdominal: Soft. Bowel sounds are normal. She exhibits no distension. There is no tenderness.  Musculoskeletal: She exhibits edema. She exhibits no tenderness.  Neurological: She is alert.  Skin: Skin is warm and dry.  Vitals reviewed.   ED Course  Procedures (including critical care time) Labs Review Labs Reviewed  CBC WITH DIFFERENTIAL/PLATELET  I-STAT CHEM 8, ED    Imaging Review No results found.   EKG Interpretation None     Patient with worsening renal function [facility].  Her primary care physician is Bufford Spikesiffany Reed MDM   Final diagnoses:  SOB (shortness of breath)         Arman FilterGail K Schulz, NP 06/29/14 [id number]  Rolan BuccoMelanie Juston Goheen, MD 06/29/14 2325  Rolan BuccoMelanie Desean Heemstra, MD 06/29/14 2326

## 2014-06-29 NOTE — ED Notes (Addendum)
Pt arrives via [facility] from home, pt was d/c from hospital with chf on 1/22, daughter was concerned about peripheral edema in bilateral feet (pt presents with pitting edema both legs), stated it had gotten worse than when pt wa d/c from hospital. Pt denies sob, 02sat 100% on RA, nad. Respirations equal and unlabored.

## 2014-06-29 NOTE — H&P (Signed)
[facility] History and Physical  [name] Nephew ONG:[id number] DOB: [birth year] DOA: 06/29/2014  Referring physician: Earley Favor, NP PCP: Bufford Spikes, DO   Chief Complaint: Leg Swelling  HPI: [name] is a 81 y.o. female presents with increase edema. Patient was admitted to a rehab facility on January 22nd. Patient daughter had been taking care of her at home. She has been having difficulty giving care despite having OT and PT coming at home. The daughter notes that she hass been having increased swelling of her legs. In addition she states that she has had difficulty with her brething. The daughter states that she has been also noting the patient yells out at night and daytime. She has been sleeping during the day and is waking when she feeds her. The daughter is not able to take care of her at home because she did not realize how debilitated her mother is.   Review of Systems:  Patient is not able to provide a ROS  Past Medical History  Diagnosis Date  . Frequency of urination   . Incontinence   . Constipation   . Hypertension   . COPD (chronic obstructive pulmonary disease)   . Pneumonia   . Dementia   . Spinal stenosis   . CHF (congestive heart failure)    Past Surgical History  Procedure Laterality Date  . Colonoscopy  1991   Social History:  reports that she quit smoking about 35 years ago. She does not have any smokeless tobacco history on file. She reports that she does not drink alcohol or use illicit drugs.  No Known Allergies  Family History  Problem Relation Age of Onset  . Cancer Brother   . Cancer Son      Prior to Admission medications   Medication Sig Start Date End Date Taking? Authorizing Provider  acetaminophen (TYLENOL) 325 MG tablet Take 650 mg by mouth every 4 (four) hours as needed for mild pain or fever.    Historical Provider, MD  calcium carbonate (TUMS - DOSED IN MG ELEMENTAL CALCIUM) 500 MG chewable tablet Chew 2 tablets by  mouth 3 (three) times daily with meals.    Historical Provider, MD  cholecalciferol (VITAMIN D) 1000 UNITS tablet Take 1,000 Units by mouth daily. For osteoporosis    Historical Provider, MD  clopidogrel (PLAVIX) 75 MG tablet Take 1 tablet (75 mg total) by mouth daily with breakfast. 04/30/13   Vassie Loll, MD  donepezil (ARICEPT) 10 MG tablet Take 1 tablet (10 mg total) by mouth at bedtime. 04/22/13   Oneal Grout, MD  furosemide (LASIX) 20 MG tablet Take 1 tablet (20 mg total) by mouth daily. 04/30/13   Vassie Loll, MD  isosorbide mononitrate (IMDUR) 30 MG 24 hr tablet Take 1 tablet (30 mg total) by mouth daily. 04/30/13   Vassie Loll, MD  lisinopril (PRINIVIL,ZESTRIL) 2.5 MG tablet Take 1 tablet (2.5 mg total) by mouth daily. 04/30/13   Vassie Loll, MD  magnesium hydroxide (MILK OF MAGNESIA) 400 MG/5ML suspension Take 30 mLs by mouth daily as needed for mild constipation.    Historical Provider, MD  metoprolol (LOPRESSOR) 25 MG tablet Take 1 tablet (25 mg total) by mouth 2 (two) times daily. 08/13/13   Tiffany L Reed, DO  Multiple Vitamin (MULTIVITAMIN) tablet Take 1 tablet by mouth daily.    Historical Provider, MD  nystatin-triamcinolone (MYCOLOG II) cream APPLY 1 APPLICATION TOPICALLY 2 (TWO) TIMES DAILY. 04/25/13   Oneal Grout, MD  polyethylene glycol (MIRALAX /  GLYCOLAX) packet Take 17 g by mouth daily as needed for mild constipation.     Historical Provider, MD  solifenacin (VESICARE) 5 MG tablet Take 5 mg by mouth daily.    Historical Provider, MD   Physical Exam: Filed Vitals:   06/29/14 2030 06/29/14 2045 06/29/14 2100 06/29/14 2115  BP: 111/48 103/50 107/51 100/52  Pulse: 70 76 65 68  Temp:      TempSrc:      Resp: SpO2: 100% 100% 100% 100%    Wt Readings from Last 3 Encounters:  06/03/14 62.596 kg (138 lb)  04/22/14 60.782 kg (134 lb)  02/18/14 61.236 kg (135 lb)    General:  Appears calm and comfortable Eyes: PERRL, normal lids ENT: grossly normal  hearing Neck: no LAD, masses or thyromegaly Cardiovascular: RRR, no m/r/g. ++ LE edema. Respiratory: CTA bilaterally, no w/r/r. Normal respiratory effort. Abdomen: soft, ntnd Skin: no rash or induration seen on limited exam Musculoskeletal: grossly normal tone BUE/BLE Psychiatric: unable to assess Neurologic: unable to assess          Labs on Admission:  Basic Metabolic Panel:  Recent Labs Lab 06/29/14 2130  NA 138  K 3.7  CL 106  GLUCOSE 97  BUN 97*  CREATININE 2.60*   Liver Function Tests: No results for input(s): AST, ALT, ALKPHOS, BILITOT, PROT, ALBUMIN in the last 168 hours. No results for input(s): LIPASE, AMYLASE in the last 168 hours. No results for input(s): AMMONIA in the last 168 hours. CBC:  Recent Labs Lab 06/29/14 2119 06/29/14 2130  WBC 9.9  --   NEUTROABS 8.0*  --   HGB 9.4* 9.9*  HCT 28.3* 29.0*  MCV 84.7  --   PLT 230  --    Cardiac Enzymes: No results for input(s): CKTOTAL, CKMB, CKMBINDEX, TROPONINI in the last 168 hours.  BNP (last 3 results) No results for input(s): BNP in the last 8760 hours.  ProBNP (last 3 results) No results for input(s): PROBNP in the last 8760 hours.  CBG: No results for input(s): GLUCAP in the last 168 hours.  Radiological Exams on Admission: Dg Chest 2 View  06/29/2014   CLINICAL DATA:  81 year old female with shortness of breath for 2 days. Confusion.  EXAM: CHEST  2 VIEW  COMPARISON:  Radiographs 04/28/2013.  Chest CT 05/13/2013  FINDINGS: Mild cardiomegaly is unchanged, there is tortuosity of the thoracic aorta. This is unchanged from prior exam. There coarse interstitial markings, decreased interstitial opacities compared to prior. Minimal atelectasis at the right lung base. No confluent airspace disease to suggest pneumonia. There is no large pleural effusion or pneumothorax. No pulmonary edema. Underlying osteopenia and severe degenerative change of both shoulders, unchanged.  IMPRESSION: Minimal atelectasis  at the right lung base. Prominent interstitial markings are improved from prior exam and may be chronic.   Electronically Signed   By: Rubye Oaks M.D.   On: 06/29/2014 21:48      Assessment/Plan Active Problems:   Chronic kidney disease (CKD), stage II (mild)   Dyslipidemia   Chronic combined systolic and diastolic heart failure   Dementia   Edema   CHF (congestive heart failure)   1. Acute renal failure -she has worsening of her renal function from the last labs available to [place] -will hold diuretics for now -hydrate gently monitoring fluid status carefully  2. Hyperlipidemia -will monitor labs  3. Dementia -she is at baseline -not manageable at home -will need placement  4. CHF -  she has not in active heart failure -will monitor IOs -need to hydrate gently due to renal failure -hold diuretics for now  5. Leg edema -this is chronic -family states maybe a little worse and predominately located on the feet   Code Status: Full Code (must indicate code status--if unknown or must be presumed, indicate so) DVT Prophylaxis:heparin Family Communication: daughter (indicate person spoken with, if applicable, with phone number if by telephone) Disposition Plan: SNF (indicate anticipated LOS)  Time spent: 60min  [facility] LL[facility]HAN,Tanga Gloor A [facility] Pager [phone]

## 2014-06-30 DIAGNOSIS — N179 Acute kidney failure, unspecified: Secondary | ICD-10-CM | POA: Diagnosis present

## 2014-06-30 DIAGNOSIS — R627 Adult failure to thrive: Secondary | ICD-10-CM

## 2014-06-30 LAB — GLUCOSE, CAPILLARY
GLUCOSE-CAPILLARY: 93 mg/dL (ref 70–99)
Glucose-Capillary: 93 mg/dL (ref 70–99)
Glucose-Capillary: 84 mg/dL (ref 70–99)
Glucose-Capillary: 105 mg/dL — ABNORMAL HIGH (ref 70–99)

## 2014-06-30 LAB — IRON AND TIBC
IRON: 52 ug/dL (ref 42–145)
Saturation Ratios: 21 % (ref 20–55)
TIBC: 252 ug/dL (ref 250–470)
UIBC: 200 ug/dL (ref 125–400)

## 2014-06-30 LAB — CBC
HCT: 28.1 % — ABNORMAL LOW (ref 36.0–46.0)
Hemoglobin: 9.4 g/dL — ABNORMAL LOW (ref 12.0–15.0)
MCH: 28.7 pg (ref 26.0–34.0)
MCHC: 33.5 g/dL (ref 30.0–36.0)
MCV: 85.9 fL (ref 78.0–100.0)
PLATELETS: 229 10*3/uL (ref 150–400)
RBC: 3.27 MIL/uL — AB (ref 3.87–5.11)
RDW: 14.3 % (ref 11.5–15.5)
WBC: 9.4 10*3/uL (ref 4.0–10.5)

## 2014-06-30 LAB — COMPREHENSIVE METABOLIC PANEL
ALK PHOS: 60 U/L (ref 39–117)
ALT: 14 U/L (ref 0–35)
AST: 25 U/L (ref 0–37)
Albumin: 3.2 g/dL — ABNORMAL LOW (ref 3.5–5.2)
Anion gap: 9 (ref 5–15)
BILIRUBIN TOTAL: 0.4 mg/dL (ref 0.3–1.2)
BUN: 93 mg/dL — ABNORMAL HIGH (ref 6–23)
CO2: 20 mmol/L (ref 19–32)
CREATININE: 2.51 mg/dL — AB (ref 0.50–1.10)
Calcium: 8.6 mg/dL (ref 8.4–10.5)
Chloride: 107 mmol/L (ref 96–112)
GFR calc Af Amer: 18 mL/min — ABNORMAL LOW (ref >90)
GFR calc non Af Amer: 16 mL/min — ABNORMAL LOW (ref >90)
Glucose, Bld: 95 mg/dL (ref 70–99)
POTASSIUM: 3.5 mmol/L (ref 3.5–5.1)
SODIUM: 136 mmol/L (ref 135–145)
Total Protein: 6.9 g/dL (ref 6.0–8.3)

## 2014-06-30 LAB — TSH: TSH: 1.342 u[IU]/mL (ref 0.350–4.500)

## 2014-06-30 LAB — FERRITIN: Ferritin: 96 ng/mL (ref 10–291)

## 2014-06-30 LAB — RETICULOCYTES
RBC.: 3.09 MIL/uL — ABNORMAL LOW (ref 3.87–5.11)
Retic Count, Absolute: 40.2 10*3/uL (ref 19.0–186.0)
Retic Ct Pct: 1.3 % (ref 0.4–3.1)

## 2014-06-30 LAB — VITAMIN B12: VITAMIN B 12: 1796 pg/mL — AB (ref 211–911)

## 2014-06-30 LAB — FOLATE

## 2014-06-30 MED ORDER — VITAMIN D3 25 MCG (1000 UNIT) PO TABS
1000.0000 [IU] | ORAL_TABLET | Freq: Every day | ORAL | Status: DC
  Administered 2014-06-30 – 2014-07-03 (×4): 1000 [IU] via ORAL
  Filled 2014-06-30 (×4): qty 1

## 2014-06-30 MED ORDER — ONDANSETRON HCL 4 MG/2ML IJ SOLN: 4.0000 mg | Freq: Four times a day (QID) | INTRAMUSCULAR | Status: DC | PRN

## 2014-06-30 MED ORDER — CLOPIDOGREL BISULFATE 75 MG PO TABS
75.0000 mg | ORAL_TABLET | Freq: Every day | ORAL | Status: DC
  Administered 2014-06-30 – 2014-07-03 (×4): 75 mg via ORAL
  Filled 2014-06-30 (×5): qty 1

## 2014-06-30 MED ORDER — ONDANSETRON HCL 4 MG PO TABS: 4.0000 mg | ORAL_TABLET | Freq: Four times a day (QID) | ORAL | Status: DC | PRN

## 2014-06-30 MED ORDER — MEMANTINE HCL ER 28 MG PO CP24
28.0000 mg | ORAL_CAPSULE | Freq: Every day | ORAL | Status: DC
  Administered 2014-06-30 – 2014-07-03 (×4): 28 mg via ORAL
  Filled 2014-06-30 (×4): qty 1

## 2014-06-30 MED ORDER — VITAMIN B-1 100 MG PO TABS
100.0000 mg | ORAL_TABLET | Freq: Every day | ORAL | Status: DC
  Administered 2014-06-30 – 2014-07-03 (×4): 100 mg via ORAL
  Filled 2014-06-30 (×4): qty 1

## 2014-06-30 MED ORDER — ACETAMINOPHEN 650 MG RE SUPP: 650.0000 mg | Freq: Four times a day (QID) | RECTAL | Status: DC | PRN

## 2014-06-30 MED ORDER — DONEPEZIL HCL 10 MG PO TABS
10.0000 mg | ORAL_TABLET | Freq: Every day | ORAL | Status: DC
  Administered 2014-06-30 – 2014-07-02 (×3): 10 mg via ORAL
  Filled 2014-06-30 (×5): qty 1

## 2014-06-30 MED ORDER — METOPROLOL TARTRATE 25 MG PO TABS
25.0000 mg | ORAL_TABLET | Freq: Two times a day (BID) | ORAL | Status: DC
  Administered 2014-06-30 – 2014-07-03 (×7): 25 mg via ORAL
  Filled 2014-06-30 (×9): qty 1

## 2014-06-30 MED ORDER — ACETAMINOPHEN 325 MG PO TABS
650.0000 mg | ORAL_TABLET | Freq: Four times a day (QID) | ORAL | Status: DC | PRN
  Administered 2014-06-30 – 2014-07-02 (×3): 650 mg via ORAL
  Filled 2014-06-30 (×3): qty 2

## 2014-06-30 MED ORDER — DARIFENACIN HYDROBROMIDE ER 7.5 MG PO TB24
7.5000 mg | ORAL_TABLET | Freq: Every day | ORAL | Status: DC
  Administered 2014-06-30 – 2014-07-03 (×4): 7.5 mg via ORAL
  Filled 2014-06-30 (×4): qty 1

## 2014-06-30 MED ORDER — CALCIUM CARBONATE ANTACID 500 MG PO CHEW
2.0000 | CHEWABLE_TABLET | Freq: Three times a day (TID) | ORAL | Status: DC
  Administered 2014-06-30 – 2014-07-03 (×11): 400 mg via ORAL
  Filled 2014-06-30 (×13): qty 2

## 2014-06-30 MED ORDER — ISOSORBIDE MONONITRATE ER 30 MG PO TB24
30.0000 mg | ORAL_TABLET | Freq: Every day | ORAL | Status: DC
  Administered 2014-06-30 – 2014-07-03 (×3): 30 mg via ORAL
  Filled 2014-06-30 (×4): qty 1

## 2014-06-30 MED ORDER — MAGNESIUM HYDROXIDE 400 MG/5ML PO SUSP: 30.0000 mL | Freq: Every day | ORAL | Status: DC | PRN

## 2014-06-30 MED ORDER — SODIUM CHLORIDE 0.9 % IJ SOLN
3.0000 mL | Freq: Two times a day (BID) | INTRAMUSCULAR | Status: DC
  Administered 2014-06-30 – 2014-07-01 (×2): 3 mL via INTRAVENOUS

## 2014-06-30 MED ORDER — SODIUM CHLORIDE 0.9 % IV SOLN
INTRAVENOUS | Status: DC
  Administered 2014-06-30: 02:00:00 via INTRAVENOUS

## 2014-06-30 MED ORDER — HEPARIN SODIUM (PORCINE) 5000 UNIT/ML IJ SOLN
5000.0000 [IU] | Freq: Three times a day (TID) | INTRAMUSCULAR | Status: DC
  Administered 2014-06-30 – 2014-07-02 (×6): 5000 [IU] via SUBCUTANEOUS
  Filled 2014-06-30 (×7): qty 1

## 2014-06-30 MED ORDER — INSULIN ASPART 100 UNIT/ML ~~LOC~~ SOLN: 0.0000 [IU] | Freq: Three times a day (TID) | SUBCUTANEOUS | Status: DC

## 2014-06-30 MED ORDER — ADULT MULTIVITAMIN W/MINERALS CH
1.0000 | ORAL_TABLET | Freq: Every day | ORAL | Status: DC
  Administered 2014-06-30 – 2014-07-03 (×4): 1 via ORAL
  Filled 2014-06-30 (×4): qty 1

## 2014-06-30 MED ORDER — FOLIC ACID 1 MG PO TABS
1.0000 mg | ORAL_TABLET | Freq: Every day | ORAL | Status: DC
  Administered 2014-06-30 – 2014-07-03 (×4): 1 mg via ORAL
  Filled 2014-06-30 (×4): qty 1

## 2014-06-30 NOTE — Care Management Note (Signed)
    Page 1 of 1   07/03/2014     3:57:55 PM CARE MANAGEMENT NOTE 07/03/2014  Patient:  [name],[name]   Account Number:  [id number]  Date Initiated:  06/30/2014  Documentation initiated by:  Elmer BalesOBARGE,COURTNEY  Subjective/Objective Assessment:   Patient was admitted with leg swelling,  acute renal failure. Chronic CHF patient.  Patient was recently admitted to a rehab facility, [facility]     Action/Plan:   Will follow for discharge needs   Anticipated DC Date:  07/04/2014   Anticipated DC Plan:  SKILLED NURSING FACILITY  In-house referral  Clinical Social Worker      DC Planning Services  CM consult      Choice offered to / List presented to:             Status of service:  Completed, signed off Medicare Important Message given?  YES (If response is "NO", the following Medicare IM given date fields will be blank) Date Medicare IM given:  07/02/2014 Medicare IM given by:  Minnetta Sandora Date Additional Medicare IM given:   Additional Medicare IM given by:    Discharge Disposition:  SKILLED NURSING FACILITY  Per UR Regulation:  Reviewed for med. necessity/level of care/duration of stay  If discussed at Long Length of Stay Meetings, dates discussed:    Comments:  07/03/14 Sidney AceJulie Dmiya Malphrus, RN, BSN [phone] Pt discharging to SNF today, per CSW arrangements.  07/02/14 Sidney AceJulie Owynn Mosqueda, RN, BSN [phone] PT recommending SNF at dc; CSW consulted to facilitate poss dc to SNF when medically stable for dc.  Will follow progress.

## 2014-06-30 NOTE — Progress Notes (Signed)
Patient was admitted to floor and skin was checked. Patient has skin tears around groin R and L side, a skin tear on her buttocks and R heel ulcer with eschar all from preadmission. Wounds placed in assessment and foam placed on them.

## 2014-06-30 NOTE — Consult Note (Signed)
WOC wound consult note Reason for Consult: Consult requested for left heel wound and buttocks. Wound type: Buttocks with partial thickness skin tear; .5X.5X.1cm, pink and moist.  Left heel with dark fluid filled purple intact blister; deep tissue injury Pressure Ulcer POA: Yes Measurement:4X5cm Dressing procedure/placement/frequency: Foam dressing to protect and promote healing to buttock wound.  Prevalon boot to reduce pressure to heel.  No topical care required at this time. Please re-consult if further assistance is needed.  Thank-you,  Cammie Mcgeeawn Exie Chrismer MSN, RN, CWOCN, [place]AP, CNS [phone]

## 2014-06-30 NOTE — Progress Notes (Signed)
UR complete.  Matilda Fleig RN, MSN 

## 2014-06-30 NOTE — Progress Notes (Signed)
Patient ID: [name]  female  NWG:[id number]    DOB: 07/27/1919    DOA: 06/29/2014  PCP: Bufford SpikesEED, TIFFANY, DO  Brief history of present illness  Patient is a 81 year old female with underlying mixed Alzheimer's and vascular dementia, chronic CHF, chronic peripheral edema, COPD, spinal stenosis presented from home. She was at skilled nursing facility, currently at home with her daughter and having difficulty with the care. Despite having home physical therapy and home health, daughter noticed increased swelling of the legs and confusion, sleeping during the day. Patient's daughter is not able to take care of her at home, reported that she did not realize how debilitated the patient was.  Assessment/Plan: Principal Problem:   Acute renal failure on CKD, stage II - BUN 40.6 Creatinine 1.54, hemoglobin 15.8, hematocrit 51.6 on 04/23/2014,  at the skilled nursing facility -  Admitted with creatinine function of 2.6, BUN 97, will hold Lasix and lisinopril for now, creatinine slightly improving - check UA and CX  Active Problems: Chronic combined systolic and diastolic CHF with peripheral edema - Currently compensated, follow creatinine function closely and monitor for any fluid overload  Dementia with failure to thrive -  PT OT consult, will likely need skilled nursing facility placement, check UA -Restart [facility]   Acute on Chronic anemia  - last labs on 04/23/14 noted in nSNF notes, hemoglobin was 15.8. Patient unable to report if she had any GI bleeding, she is on Plavix prior to admission  - will check stool occult test, anemia panel   Essential hypertension   currently stable, continue Imdur   DVT Prophylaxis: heparin sq  Code Status: FC  Family Communication:  Disposition: will likely need SNF  Consultants:  None   Procedures:  none  Antibiotics:  None     Subjective: Patient seen and examined, alert and awake, oriented 2 no specific complaints, no fevers,  chills, nausea or vomiting.  Objective  Weight change:   Intake/Output Summary (Last 24 hours) at 06/30/14 1058 Last data filed at 06/30/14 0837  Gross per 24 hour  Intake    120 ml  Output      2 ml  Net    118 ml   Blood pressure 126/46, pulse 77, temperature 97.7 F (36.5 C), temperature source Oral, resp. rate 17, height 5' (1.524 m), weight 63.3 kg (139 lb 8.8 oz), SpO2 99 %.  Physical Exam: General: Alert and awake, orientedx2 CVS: S1-S2 clear, no murmur rubs or gallops Chest: clear to auscultation bilaterally Abdomen: soft nontender, nondistended, normal bowel sounds  Extremities: no cyanosis, clubbing, + edema noted bilaterally   Lab Results: Basic Metabolic Panel:  Recent Labs Lab 06/29/14 2130 06/30/14 0100  NA 138 136  K 3.7 3.5  CL 106 107  CO2  --  20  GLUCOSE 97 95  BUN 97* 93*  CREATININE 2.60* 2.51*  CALCIUM  --  8.6   Liver Function Tests:  Recent Labs Lab 06/30/14 0100  AST 25  ALT 14  ALKPHOS 60  BILITOT 0.4  PROT 6.9  ALBUMIN 3.2*   No results for input(s): LIPASE, AMYLASE in the last 168 hours. No results for input(s): AMMONIA in the last 168 hours. CBC:  Recent Labs Lab 06/29/14 2119 06/29/14 2130 06/30/14 0100  WBC 9.9  --  9.4  NEUTROABS 8.0*  --   --   HGB 9.4* 9.9* 9.4*  HCT 28.3* 29.0* 28.1*  MCV 84.7  --  85.9  PLT 230  --  229   Cardiac Enzymes: No results for input(s): CKTOTAL, CKMB, CKMBINDEX, TROPONINI in the last 168 hours. BNP: Invalid input(s): POCBNP CBG:  Recent Labs Lab 06/30/14 0530  GLUCAP 93     Micro Results: No results found for this or any previous visit (from the past 240 hour(s)).  Studies/Results: Dg Chest 2 View  06/29/2014   CLINICAL DATA:  81 year old female with shortness of breath for 2 days. Confusion.  EXAM: CHEST  2 VIEW  COMPARISON:  Radiographs 04/28/2013.  Chest CT 05/13/2013  FINDINGS: Mild cardiomegaly is unchanged, there is tortuosity of the thoracic aorta. This is  unchanged from prior exam. There coarse interstitial markings, decreased interstitial opacities compared to prior. Minimal atelectasis at the right lung base. No confluent airspace disease to suggest pneumonia. There is no large pleural effusion or pneumothorax. No pulmonary edema. Underlying osteopenia and severe degenerative change of both shoulders, unchanged.  IMPRESSION: Minimal atelectasis at the right lung base. Prominent interstitial markings are improved from prior exam and may be chronic.   Electronically Signed   By: Rubye Oaks M.D.   On: 06/29/2014 21:48    Medications: Scheduled Meds: . calcium carbonate  2 tablet Oral TID WC  . cholecalciferol  1,000 Units Oral Daily  . clopidogrel  75 mg Oral Q breakfast  . darifenacin  7.5 mg Oral Daily  . donepezil  10 mg Oral QHS  . folic acid  1 mg Oral Daily  . heparin subcutaneous  5,000 Units Subcutaneous Q8H  . insulin aspart  0-9 Units Subcutaneous TID WC  . isosorbide mononitrate  30 mg Oral Daily  . memantine  28 mg Oral Daily  . metoprolol  25 mg Oral BID  . multivitamin with minerals  1 tablet Oral Daily  . sodium chloride  3 mL Intravenous Q12H  . thiamine  100 mg Oral Daily    time spent 25 minutes   LOS: 1 day   RAI,RIPUDEEP M.D. [facility] 06/30/2014, 10:58 AM Pager: [phone]  If 7PM-7AM, please contact night-coverage [email or web address] Password [email or web address]

## 2014-07-01 ENCOUNTER — Ambulatory Visit: Payer: Medicare Other | Admitting: Internal Medicine

## 2014-07-01 DIAGNOSIS — A047 Enterocolitis due to Clostridium difficile: Secondary | ICD-10-CM

## 2014-07-01 DIAGNOSIS — N179 Acute kidney failure, unspecified: Principal | ICD-10-CM

## 2014-07-01 LAB — HEMOGLOBIN A1C
Hgb A1c MFr Bld: 5.5 % (ref 4.8–5.6)
Mean Plasma Glucose: 111 mg/dL

## 2014-07-01 LAB — CLOSTRIDIUM DIFFICILE BY PCR: Toxigenic C. Difficile by PCR: POSITIVE — AB

## 2014-07-01 LAB — GLUCOSE, CAPILLARY
GLUCOSE-CAPILLARY: 81 mg/dL (ref 70–99)
GLUCOSE-CAPILLARY: 86 mg/dL (ref 70–99)
Glucose-Capillary: 99 mg/dL (ref 70–99)
Glucose-Capillary: 107 mg/dL — ABNORMAL HIGH (ref 70–99)

## 2014-07-01 LAB — BASIC METABOLIC PANEL
Anion gap: 4 — ABNORMAL LOW (ref 5–15)
BUN: 68 mg/dL — ABNORMAL HIGH (ref 6–23)
CO2: 23 mmol/L (ref 19–32)
Calcium: 8.6 mg/dL (ref 8.4–10.5)
Chloride: 114 mmol/L — ABNORMAL HIGH (ref 96–112)
Creatinine, Ser: 1.73 mg/dL — ABNORMAL HIGH (ref 0.50–1.10)
GFR calc Af Amer: 28 mL/min — ABNORMAL LOW (ref >90)
GFR, EST NON AFRICAN AMERICAN: 24 mL/min — AB (ref >90)
GLUCOSE: 78 mg/dL (ref 70–99)
POTASSIUM: 3.8 mmol/L (ref 3.5–5.1)
Sodium: 141 mmol/L (ref 135–145)

## 2014-07-01 LAB — CBC
HEMATOCRIT: 26.1 % — AB (ref 36.0–46.0)
Hemoglobin: 8.9 g/dL — ABNORMAL LOW (ref 12.0–15.0)
MCH: 29.3 pg (ref 26.0–34.0)
MCHC: 34.1 g/dL (ref 30.0–36.0)
MCV: 85.9 fL (ref 78.0–100.0)
Platelets: 191 10*3/uL (ref 150–400)
RBC: 3.04 MIL/uL — AB (ref 3.87–5.11)
RDW: 14.5 % (ref 11.5–15.5)
WBC: 6.7 10*3/uL (ref 4.0–10.5)

## 2014-07-01 LAB — OCCULT BLOOD X 1 CARD TO LAB, STOOL: FECAL OCCULT BLD: NEGATIVE

## 2014-07-01 MED ORDER — METRONIDAZOLE IN NACL 5-0.79 MG/ML-% IV SOLN
500.0000 mg | Freq: Three times a day (TID) | INTRAVENOUS | Status: DC
  Administered 2014-07-01 – 2014-07-02 (×4): 500 mg via INTRAVENOUS
  Filled 2014-07-01 (×5): qty 100

## 2014-07-01 MED ORDER — SODIUM CHLORIDE 0.9 % IV SOLN
INTRAVENOUS | Status: DC
  Administered 2014-07-01 – 2014-07-02 (×3): via INTRAVENOUS

## 2014-07-01 MED ORDER — SACCHAROMYCES BOULARDII 250 MG PO CAPS
250.0000 mg | ORAL_CAPSULE | Freq: Two times a day (BID) | ORAL | Status: DC
  Administered 2014-07-01 – 2014-07-03 (×4): 250 mg via ORAL
  Filled 2014-07-01 (×5): qty 1

## 2014-07-01 NOTE — Evaluation (Signed)
Clinical/Bedside Swallow Evaluation Patient Details  Name: [name] MRN: [id number] Date of Birth: July 24, 1919  Today's Date: 07/01/2014 Time: SLP Start Time (ACUTE ONLY): 0955 SLP Stop Time (ACUTE ONLY): 1005 SLP Time Calculation (min) (ACUTE ONLY): 10 min  Past Medical History:  Past Medical History  Diagnosis Date  . Frequency of urination   . Incontinence   . Constipation   . Hypertension   . COPD (chronic obstructive pulmonary disease)   . Pneumonia   . Dementia   . Spinal stenosis   . CHF (congestive heart failure)    Past Surgical History:  Past Surgical History  Procedure Laterality Date  . Colonoscopy  1991   HPI:  Patient is a 81 year old female with underlying mixed Alzheimer's and vascular dementia, chronic CHF, chronic peripheral edema, COPD, spinal stenosis presented from home. She was at skilled nursing facility, currently at home with her daughter and having difficulty with the care. Despite having home physical therapy and home health, daughter noticed increased swelling of the legs and confusion, sleeping during the day. Patient's daughter is not able to take care of her at home, reported that she did not realize how debilitated the patient was.   Assessment / Plan / Recommendation Clinical Impression  Pt demonstrates adequate tolerance of thin and pureed solids with no signs of aspiration or oral holding. SLP attempted saltine, pt did not even attempt to masticate and shook her head "no".  Does not appear that pt has the capacity to masticate and a Dys 1 (puree) diet with thin liquids is recommended. Discussed with RN. No SLP f/u needed.     Aspiration Risk  Mild    Diet Recommendation Thin liquid;Dysphagia 1 (Puree)   Liquid Administration via: Cup;Straw Medication Administration: Crushed with puree Supervision: Full supervision/cueing for compensatory strategies Compensations: Slow rate;Small sips/bites Postural Changes and/or Swallow Maneuvers:  Seated upright 90 degrees    Other  Recommendations     Follow Up Recommendations  None    Frequency and Duration        Pertinent Vitals/Pain NA    SLP Swallow Goals     Swallow Study Prior Functional Status       General HPI: Patient is a 81 year old female with underlying mixed Alzheimer's and vascular dementia, chronic CHF, chronic peripheral edema, COPD, spinal stenosis presented from home. She was at skilled nursing facility, currently at home with her daughter and having difficulty with the care. Despite having home physical therapy and home health, daughter noticed increased swelling of the legs and confusion, sleeping during the day. Patient's daughter is not able to take care of her at home, reported that she did not realize how debilitated the patient was. Type of Study: Bedside swallow evaluation Diet Prior to this Study: Regular;Thin liquids Temperature Spikes Noted: No Respiratory Status: Room air History of Recent Intubation: No Behavior/Cognition: Alert;Cooperative;Pleasant mood;Requires cueing Oral Cavity - Dentition: Poor condition;Missing dentition Self-Feeding Abilities: Needs assist Patient Positioning: Upright in bed Baseline Vocal Quality: Clear Volitional Cough: Cognitively unable to elicit Volitional Swallow: Unable to elicit    Oral/Motor/Sensory Function Overall Oral Motor/Sensory Function: Appears within functional limits for tasks assessed   Ice Chips     Thin Liquid Thin Liquid: Within functional limits Presentation: Cup;Straw;Self Fed    Nectar Thick Nectar Thick Liquid: Not tested   Honey Thick Honey Thick Liquid: Not tested   Puree Puree: Within functional limits   Solid   GO    Solid: Not tested  Supervised and reviewed by [facility]kyBonnie Boyde Grieco MA CCC-SLP  Yovanni Frenette, Riley NearingBonnie Caroline 07/01/2014,10:30 AM

## 2014-07-01 NOTE — Progress Notes (Signed)
MD instructed to give metoprolol and hold imdur. Sherald BargeSpencer, Zan Triska T

## 2014-07-01 NOTE — Evaluation (Signed)
Physical Therapy Evaluation Patient Details Name: Briana LuzGladys J Hubbard MRN: [id number] DOB: 07/27/1919 Today's Date: 07/01/2014   History of Present Illness  81 yo female with onset of BLE edema after going home from SNF with her daughter.  Pt was in acute renal failure, incontinent of urine.  PMHx:  constipation, HTN, COPD, PNA, dementia, spinal stenosis, CHF, CKD, HLD  Clinical Impression  Pt was seen for evaluation of her abiltiy to tolerate gait and transfers in the setting of acute renal failure and debiltiy from recent SNF stay.  Her plan is to restore gait and return home when able but for now cannot succeed with her general weakness.    Follow Up Recommendations SNF;Supervision/Assistance - 24 hour    Equipment Recommendations  None recommended by PT    Recommendations for Other Services       Precautions / Restrictions Precautions Precautions: Fall Restrictions Weight Bearing Restrictions: No      Mobility  Bed Mobility Overal bed mobility: Needs Assistance Bed Mobility: Supine to Sit;Sit to Supine     Supine to sit: +2 for physical assistance;Mod assist     General bed mobility comments: HOB elevated and using bedrails to move to bedside, PT is mainly sequencing and moving her.  Transfers Overall transfer level: Needs assistance Equipment used: 2 person hand held assist Transfers: Sit to/from BJ'sStand;Stand Pivot Transfers Sit to Stand: +2 physical assistance;Max assist Stand pivot transfers: Max assist;+2 physical assistance       General transfer comment: Pt is weak and needs dense assistance to get her up and pivoted  Ambulation/Gait             General Gait Details: unable  Stairs            Wheelchair Mobility    Modified Rankin (Stroke Patients Only)       Balance Overall balance assessment: Needs assistance Sitting-balance support: Feet supported (resists using L elbow to support) Sitting balance-Leahy Scale: Poor   Postural  control: Posterior lean;Right lateral lean Standing balance support: Bilateral upper extremity supported;During functional activity Standing balance-Leahy Scale: Zero Standing balance comment: Pt barely assists wtih BLE's and cannot support herself  with LE"s due to flexion contractures knees and ankles,                              Pertinent Vitals/Pain Pain Assessment: Faces Pain Score: 7  Faces Pain Scale: Hurts even more Pain Location: back and legs with movemennt Pain Intervention(s): Limited activity within patient's tolerance;Monitored during session;Repositioned    Home Living Family/patient expects to be discharged to:: Skilled nursing facility Living Arrangements: Children                    Prior Function Level of Independence: Needs assistance   Gait / Transfers Assistance Needed: assist to use RW and transfer  ADL's / Homemaking Assistance Needed: dependent        Hand Dominance        Extremity/Trunk Assessment   Upper Extremity Assessment: Generalized weakness           Lower Extremity Assessment: Generalized weakness      Cervical / Trunk Assessment: Kyphotic  Communication   Communication: Expressive difficulties  Cognition Arousal/Alertness: Lethargic Behavior During Therapy: Flat affect Overall Cognitive Status: History of cognitive impairments - at baseline       Memory: Decreased recall of precautions;Decreased short-term memory  General Comments General comments (skin integrity, edema, etc.): Pt was seen for evaluation of her mobility and noted her difficulty is to  control LE"s and support herself.  Pt is a candidate to go back to SNF as she is not capable of  controlling in standing without support    Exercises        Assessment/Plan    PT Assessment Patient needs continued PT services  PT Diagnosis Abnormality of gait   PT Problem List Decreased strength;Decreased range of motion;Decreased  activity tolerance;Decreased balance;Decreased mobility;Decreased coordination;Decreased cognition;Decreased knowledge of use of DME;Decreased safety awareness;Decreased knowledge of precautions;Cardiopulmonary status limiting activity;Decreased skin integrity;Pain  PT Treatment Interventions DME instruction;Gait training;Functional mobility training;Therapeutic activities;Balance training;Therapeutic exercise;Neuromuscular re-education;Cognitive remediation;Patient/family education   PT Goals (Current goals can be found in the Care Plan section) Acute Rehab PT Goals Patient Stated Goal: To walk without help PT Goal Formulation: With patient Time For Goal Achievement: 07/15/14 Potential to Achieve Goals: Fair    Frequency Min 2X/week   Barriers to discharge Decreased caregiver support;Inaccessible home environment Daughter is not capable of keeping pt at home and may be a serious fall risk if pt cannot stand and maneuver better first    Co-evaluation               End of Session Equipment Utilized During Treatment: Gait belt Activity Tolerance: Patient limited by fatigue;Patient limited by lethargy;Patient limited by pain Patient left: in chair;with call bell/phone within reach Nurse Communication: Mobility status         Time: 4098-1191 PT Time Calculation (min) (ACUTE ONLY): 26 min   Charges:   PT Evaluation $Initial PT Evaluation Tier I: 1 Procedure PT Treatments $Therapeutic Activity: 8-22 mins   PT G Codes:        Ivar Drape 2014-07-16, 3:27 PM   Samul Dada, PT MS Acute Rehab Dept. Number: [phone]

## 2014-07-01 NOTE — Progress Notes (Signed)
MD paged regarding pt's BP and asked whether or not to give morning BP meds.  Will continue to monitor and wait for orders. Sherald BargeSpencer, Yanette Tripoli T

## 2014-07-01 NOTE — Progress Notes (Signed)
[facility] PROGRESS NOTE  [name] WUJ:[id number] DOB: 07/27/1919 DOA: 06/29/2014 PCP: No primary care provider on file.  Assessment/Plan: 1. Cdiff colitis  1. Multiple loose stools noted, which prompted checking for Cdiff 2. Cdiff is positive 3. Will order flagyl and florastor 4. Cont IVF and supportive care 2. Acute on chronic renal failure 1. Renal function is improving 2. Will re-order IVF at 75cc/hr as pt clinically appears dry and dehydrated 3. Combined systolic, diastolic CHF 1. Euvolemic currently 2. Monitor closely 4. Dementia with failure to thrive 1. Encourage PO as tolerated 2. Patient remains lethargic 5. Acute on chronic anemia 1. hgb is slightly lower this AM 2. Monitor closely and transfuse as needed 6. COPD 1. Stable 2. No wheezing on exam 3. On min o2 support 7. Spinal stenosis 1. Seems stable currently 8. DVT prophylaxis 1. Heparin subQ  Code Status: Full Family Communication: Pt in room, daughter and granddaughter at bedside (indicate person spoken with, relationship, and if by phone, the number) Disposition Plan: Pending improvement in hydration status and mentation, pending possible SNF   Consultants:  none  Procedures:  none  Antibiotics:  Flagyl 07/01/14>>> (indicate start date, and stop date if known)  HPI/Subjective: No acute events. Pt lethargic this AM, but arousable  Objective: Filed Vitals:   06/30/14 2151 07/01/14 0156 07/01/14 0427 07/01/14 1012  BP: 138/52 134/61 138/69 119/38  Pulse: 72 70 74 62  Temp: 98.1 F (36.7 C) 98.6 F (37 C) 98.2 F (36.8 C)   TempSrc: Oral Oral Oral   Resp: 18 18 18    Height:      Weight:   62.886 kg (138 lb 10.2 oz)   SpO2: 95% 98% 100%     Intake/Output Summary (Last 24 hours) at 07/01/14 1512 Last data filed at 07/01/14 1237  Gross per 24 hour  Intake    360 ml  Output      7 ml  Net    353 ml   Filed Weights   06/30/14 0033 06/30/14 0524 07/01/14 0427  Weight: 63.3  kg (139 lb 8.8 oz) 63.3 kg (139 lb 8.8 oz) 62.886 kg (138 lb 10.2 oz)    Exam:   General:  Asleep, arousable, in nad  Cardiovascular: regular, s1,s2  Respiratory: normal resp effort, no wheezing  Abdomen: soft, pos BS, nondistended  Musculoskeletal: perfused, no clubbing   Data Reviewed: Basic Metabolic Panel:  Recent Labs Lab 06/29/14 2130 06/30/14 0100 07/01/14 0530  NA 138 136 141  Hubbard 3.7 3.5 3.8  CL 106 107 114*  CO2  --  20 23  GLUCOSE 97 95 78  BUN 97* 93* 68*  CREATININE 2.60* 2.51* 1.73*  CALCIUM  --  8.6 8.6   Liver Function Tests:  Recent Labs Lab 06/30/14 0100  AST 25  ALT 14  ALKPHOS 60  BILITOT 0.4  PROT 6.9  ALBUMIN 3.2*   No results for input(s): LIPASE, AMYLASE in the last 168 hours. No results for input(s): AMMONIA in the last 168 hours. CBC:  Recent Labs Lab 06/29/14 2119 06/29/14 2130 06/30/14 0100 07/01/14 0530  WBC 9.9  --  9.4 6.7  NEUTROABS 8.0*  --   --   --   HGB 9.4* 9.9* 9.4* 8.9*  HCT 28.3* 29.0* 28.1* 26.1*  MCV 84.7  --  85.9 85.9  PLT 230  --  229 191   Cardiac Enzymes: No results for input(s): CKTOTAL, CKMB, CKMBINDEX, TROPONINI in the last 168 hours. BNP (last 3  results)  Recent Labs  06/29/14 2207  BNP 181.4*    ProBNP (last 3 results) No results for input(s): PROBNP in the last 8760 hours.  CBG:  Recent Labs Lab 06/30/14 1130 06/30/14 1707 06/30/14 2135 07/01/14 0608 07/01/14 1147  GLUCAP 93 84 105* 81 86    Recent Results (from the past 240 hour(s))  Clostridium Difficile by PCR     Status: Abnormal   Collection Time: 07/01/14  3:11 AM  Result Value Ref Range Status   C difficile by pcr POSITIVE (A) NEGATIVE Final    Comment: CRITICAL RESULT CALLED TO, READ BACK BY AND VERIFIED WITH: T.SPENCER,RN 07/01/14 0817 BY BSLADE      Studies: Dg Chest 2 View  06/29/2014   CLINICAL DATA:  81 year old female with shortness of breath for 2 days. Confusion.  EXAM: CHEST  2 VIEW  COMPARISON:   Radiographs 04/28/2013.  Chest CT 05/13/2013  FINDINGS: Mild cardiomegaly is unchanged, there is tortuosity of the thoracic aorta. This is unchanged from prior exam. There coarse interstitial markings, decreased interstitial opacities compared to prior. Minimal atelectasis at the right lung base. No confluent airspace disease to suggest pneumonia. There is no large pleural effusion or pneumothorax. No pulmonary edema. Underlying osteopenia and severe degenerative change of both shoulders, unchanged.  IMPRESSION: Minimal atelectasis at the right lung base. Prominent interstitial markings are improved from prior exam and may be chronic.   Electronically Signed   By: Rubye Oaks M.D.   On: 06/29/2014 21:48    Scheduled Meds: . calcium carbonate  2 tablet Oral TID WC  . cholecalciferol  1,000 Units Oral Daily  . clopidogrel  75 mg Oral Q breakfast  . darifenacin  7.5 mg Oral Daily  . donepezil  10 mg Oral QHS  . folic acid  1 mg Oral Daily  . heparin subcutaneous  5,000 Units Subcutaneous Q8H  . insulin aspart  0-9 Units Subcutaneous TID WC  . isosorbide mononitrate  30 mg Oral Daily  . memantine  28 mg Oral Daily  . metoprolol  25 mg Oral BID  . metronidazole  500 mg Intravenous Q8H  . multivitamin with minerals  1 tablet Oral Daily  . saccharomyces boulardii  250 mg Oral BID  . sodium chloride  3 mL Intravenous Q12H  . thiamine  100 mg Oral Daily   Continuous Infusions: . sodium chloride 75 mL/hr at 07/01/14 1220    Principal Problem:   Acute renal failure Active Problems:   Chronic kidney disease (CKD), stage II (mild)   Dyslipidemia   Chronic combined systolic and diastolic heart failure   Dementia   Edema   CHF (congestive heart failure)   Failure to thrive in adult   [name]  [facility] Pager [phone]. If 7PM-7AM, please contact night-coverage at [email or web address], password [email or web address] 07/01/2014, 3:12 PM  LOS: 2 days

## 2014-07-01 NOTE — Progress Notes (Signed)
OT Cancellation Note  Patient Details Name: [name] MRN: [id number] DOB: 07/27/1919   Cancelled Treatment:    Reason Eval/Treat Not Completed: Other (comment) Attempted to see pt. Pt with active bedrest orders. Contacted nurse to update activity level in order for OT/PT to evaluate. [facility] Center[facility],HILLARY  Wash Nienhaus, OTR/L  [phone] 07/01/2014 07/01/2014, 1:04 PM

## 2014-07-01 NOTE — Clinical Documentation Improvement (Signed)
PLEASE NOTE IF PRESENT ON ADMISSION  Presents with ARF, chronic systolic and diastolic CHF, pressure ulcer t left heel noted by Wound Care Nurse; not stage identified.   Wound is described as: "Left heel with dark fluid filled purple intact blister; deep tissue injury" - 2/2 Consult Note  Please clarify the stage of pressure ulcer of left heel and document findings in next progress note and include in discharge summary. Coders and CDI cannot assume any findings read; must be documented.   Stage  I  Pressure Ulcer   (reddening of the skin) Stage  II Pressure Ulcer  (blister open or unopened) Stage  III Pressure Ulcer (through all layers skin) Stage IV Pressure Ulcer   (through skin & underlying  muscle, tendons, and bones) Other Condition  Thank You, Shellee MiloEileen T Avontae Burkhead ,RN Clinical Documentation Specialist:  [phone]  [facility]- Health Information Management

## 2014-07-02 DIAGNOSIS — L89891 Pressure ulcer of other site, stage 1: Secondary | ICD-10-CM

## 2014-07-02 LAB — URINALYSIS, ROUTINE W REFLEX MICROSCOPIC
Bilirubin Urine: NEGATIVE
Glucose, UA: NEGATIVE mg/dL
Ketones, ur: NEGATIVE mg/dL
Nitrite: NEGATIVE
PH: 6.5 (ref 5.0–8.0)
Protein, ur: NEGATIVE mg/dL
Specific Gravity, Urine: 1.016 (ref 1.005–1.030)
Urobilinogen, UA: 0.2 mg/dL (ref 0.0–1.0)

## 2014-07-02 LAB — URINE MICROSCOPIC-ADD ON

## 2014-07-02 LAB — BASIC METABOLIC PANEL
Anion gap: 8 (ref 5–15)
BUN: 46 mg/dL — ABNORMAL HIGH (ref 6–23)
CALCIUM: 8.5 mg/dL (ref 8.4–10.5)
CO2: 18 mmol/L — AB (ref 19–32)
Chloride: 119 mmol/L — ABNORMAL HIGH (ref 96–112)
Creatinine, Ser: 1.33 mg/dL — ABNORMAL HIGH (ref 0.50–1.10)
GFR calc Af Amer: 39 mL/min — ABNORMAL LOW (ref >90)
GFR, EST NON AFRICAN AMERICAN: 34 mL/min — AB (ref >90)
GLUCOSE: 86 mg/dL (ref 70–99)
Potassium: 3.7 mmol/L (ref 3.5–5.1)
Sodium: 145 mmol/L (ref 135–145)

## 2014-07-02 LAB — GLUCOSE, CAPILLARY
GLUCOSE-CAPILLARY: 79 mg/dL (ref 70–99)
GLUCOSE-CAPILLARY: 85 mg/dL (ref 70–99)
GLUCOSE-CAPILLARY: 103 mg/dL — AB (ref 70–99)
GLUCOSE-CAPILLARY: 97 mg/dL (ref 70–99)
Glucose-Capillary: 94 mg/dL (ref 70–99)
Glucose-Capillary: 90 mg/dL (ref 70–99)

## 2014-07-02 MED ORDER — METRONIDAZOLE 500 MG PO TABS
500.0000 mg | ORAL_TABLET | Freq: Three times a day (TID) | ORAL | Status: DC
  Administered 2014-07-02 – 2014-07-03 (×4): 500 mg via ORAL
  Filled 2014-07-02 (×6): qty 1

## 2014-07-02 MED ORDER — HEPARIN SODIUM (PORCINE) 5000 UNIT/ML IJ SOLN
5000.0000 [IU] | Freq: Three times a day (TID) | INTRAMUSCULAR | Status: DC
  Administered 2014-07-03 (×2): 5000 [IU] via SUBCUTANEOUS
  Filled 2014-07-02 (×3): qty 1

## 2014-07-02 MED ORDER — NYSTATIN 100000 UNIT/GM EX POWD
Freq: Three times a day (TID) | CUTANEOUS | Status: DC
  Administered 2014-07-02 – 2014-07-03 (×3): via TOPICAL
  Filled 2014-07-02: qty 15

## 2014-07-02 MED ORDER — HEPARIN SODIUM (PORCINE) 5000 UNIT/ML IJ SOLN: 5000.0000 [IU] | Freq: Three times a day (TID) | INTRAMUSCULAR | Status: DC

## 2014-07-02 NOTE — Evaluation (Signed)
Occupational Therapy Evaluation Patient Details Name: [name] MRN: [id number] DOB: 07/27/1919 Today's Date: 07/02/2014    History of Present Illness 81 yo female with onset of BLE edema after going home from SNF with her daughter.  Pt was in acute renal failure, incontinent of urine.  PMHx:  constipation, HTN, COPD, PNA, dementia, spinal stenosis, CHF, CKD, HLD   Clinical Impression   Pt admitted with the above diagnoses and presents with below problem list. Pt currently at baseline dependent with ADLs. Recommending SNF at d/c. No further acute OT needs at this time.    Follow Up Recommendations  SNF    Equipment Recommendations  Other (comment) (defer to next venue)    Recommendations for Other Services       Precautions / Restrictions Restrictions Weight Bearing Restrictions: No      Mobility Bed Mobility Overal bed mobility: Needs Assistance Bed Mobility: Supine to Sit;Sit to Supine     Supine to sit: +2 for physical assistance;Mod assist Sit to supine: Mod assist;+2 for physical assistance      Transfers                      Balance                                            ADL Overall ADL's : At baseline                                       General ADL Comments: Pt at baseline- dependent with ADLs     Vision                     Perception     Praxis      Pertinent Vitals/Pain Pain Assessment: Faces Pain Score: 4  Pain Intervention(s): Limited activity within patient's tolerance;Monitored during session     Hand Dominance Right   Extremity/Trunk Assessment Upper Extremity Assessment Upper Extremity Assessment: Generalized weakness   Lower Extremity Assessment Lower Extremity Assessment: Defer to PT evaluation       Communication Communication Communication: Expressive difficulties   Cognition Arousal/Alertness: Awake/alert Behavior During Therapy: Flat affect Overall  Cognitive Status: History of cognitive impairments - at baseline       Memory: Decreased recall of precautions;Decreased short-term memory             General Comments       Exercises       Shoulder Instructions      Home Living Family/patient expects to be discharged to:: Skilled nursing facility Living Arrangements: Children                                      Prior Functioning/Environment Level of Independence: Needs assistance  Gait / Transfers Assistance Needed: assist to use RW and transfer ADL's / Homemaking Assistance Needed: dependent per PT note        OT Diagnosis: Generalized weakness;Cognitive deficits   OT Problem List: Decreased strength;Decreased activity tolerance;Impaired balance (sitting and/or standing);Decreased cognition;Decreased safety awareness;Decreased knowledge of use of DME or AE;Increased edema   OT Treatment/Interventions:      OT Goals(Current goals can be found in the  care plan section)    OT Frequency:     Barriers to D/C:            Co-evaluation              End of Session    Activity Tolerance: Patient limited by fatigue Patient left: in bed;with call bell/phone within reach;with bed alarm set   Time: 1440-1500 OT Time Calculation (min): 20 min Charges:  OT General Charges $OT Visit: 1 Procedure OT Evaluation $Initial OT Evaluation Tier I: 1 Procedure G-Codes:    Pilar Grammes Jul 11, 2014, 3:10 PM

## 2014-07-02 NOTE — Progress Notes (Signed)
Nutrition Brief Note  Patient identified due to Low Braden Score   Wt Readings from Last 15 Encounters:  07/02/14 142 lb 3.2 oz (64.5 kg)  06/03/14 138 lb (62.596 kg)  04/22/14 134 lb (60.782 kg)  02/18/14 135 lb (61.236 kg)  12/24/13 142 lb (64.411 kg)  10/22/13 143 lb (64.864 kg)  08/13/13 134 lb (60.782 kg)  05/01/13 122 lb 5.7 oz (55.5 kg)  04/22/13 128 lb 3.2 oz (58.151 kg)  01/22/13 122 lb (55.339 kg)  12/24/12 128 lb (58.06 kg)    Body mass index is 27.77 kg/(m^2). Patient meets criteria for Overweight based on current BMI. Weight stable/trending up per weight history. Pt asleep at time of visit, no family present. Per nursing notes pt is eating 75% of meals; nursing student reports pt is able to feed herself.   Current diet order is Dysphagia 1 with thin liquids, patient is consuming approximately 75% of meals at this time. Labs and medications reviewed.   No nutrition interventions warranted at this time. If nutrition issues arise, please consult RD.   Ian Malkineanne Barnett RD, LDN Inpatient Clinical Dietitian Pager: [phone] After Hours Pager: [phone]

## 2014-07-02 NOTE — Progress Notes (Signed)
[facility] PROGRESS NOTE  [name] Auman ZOX:[id number] DOB: February 01, 1920 DOA: 06/29/2014 PCP: No primary care provider on file.  Assessment/Plan: 1. Cdiff colitis  1. Nursing reports decreased frequency of stool output 2. Cdiff is positive 3. Tolerating flagyl and florastor 4. Will transition patient to PO 5. Cont gentle IVF and supportive care 2. Acute on chronic renal failure 1. Renal function is improving with gentle hydration 3. Combined systolic, diastolic CHF 1. Euvolemic currently 2. Monitor closely 4. Dementia with failure to thrive 1. Continue to encourage PO as tolerated 2. Patient appears more awake today 5. Acute on chronic anemia 1. Continue to monitor closely and transfuse as needed 6. COPD 1. Stable 2. No wheezing on exam 3. On min o2 support 7. Spinal stenosis 1. Seems stable currently 8. DVT prophylaxis 1. Heparin subQ 9. Stage 1 pressure ulcer of L heel 1. WOC was consulted  Code Status: Full Family Communication: Pt in room Disposition Plan: Pending improvement in hydration status and mentation, pending possible SNF   Consultants:  none  Procedures:  none  Antibiotics:  Flagyl 07/01/14>>> (indicate start date, and stop date if known)  HPI/Subjective: Pt is without complaints. Staff reports decreased stool output  Objective: Filed Vitals:   07/01/14 2126 07/02/14 0457 07/02/14 0934 07/02/14 1359  BP: 139/55 133/62 137/63 126/54  Pulse: 77 80 68 71  Temp: 97.4 F (36.3 C) 97.6 F (36.4 C) 98.2 F (36.8 C) 98.1 F (36.7 C)  TempSrc: Oral Oral Oral Oral  Resp: Height:      Weight:  64.5 kg (142 lb 3.2 oz)    SpO2: 100% 100% 98% 100%    Intake/Output Summary (Last 24 hours) at 07/02/14 1700 Last data filed at 07/02/14 1402  Gross per 24 hour  Intake 3154.59 ml  Output      0 ml  Net 3154.59 ml   Filed Weights   06/30/14 0524 07/01/14 0427 07/02/14 0457  Weight: 63.3 kg (139 lb 8.8 oz) 62.886 kg (138 lb 10.2  oz) 64.5 kg (142 lb 3.2 oz)    Exam:   General:  Awake, in nad  Cardiovascular: regular, s1,s2  Respiratory: normal resp effort, no wheezing  Abdomen: soft, regular BS, nondistended, nontender  Musculoskeletal: perfused, no clubbing   Data Reviewed: Basic Metabolic Panel:  Recent Labs Lab 06/29/14 2130 06/30/14 0100 07/01/14 0530 07/02/14 0440  NA 138 136 141 145  K 3.7 3.5 3.8 3.7  CL 106 107 114* 119*  CO2  --  20 23 18*  GLUCOSE 97 95 78 86  BUN 97* 93* 68* 46*  CREATININE 2.60* 2.51* 1.73* 1.33*  CALCIUM  --  8.6 8.6 8.5   Liver Function Tests:  Recent Labs Lab 06/30/14 0100  AST 25  ALT 14  ALKPHOS 60  BILITOT 0.4  PROT 6.9  ALBUMIN 3.2*   No results for input(s): LIPASE, AMYLASE in the last 168 hours. No results for input(s): AMMONIA in the last 168 hours. CBC:  Recent Labs Lab 06/29/14 2119 06/29/14 2130 06/30/14 0100 07/01/14 0530  WBC 9.9  --  9.4 6.7  NEUTROABS 8.0*  --   --   --   HGB 9.4* 9.9* 9.4* 8.9*  HCT 28.3* 29.0* 28.1* 26.1*  MCV 84.7  --  85.9 85.9  PLT 230  --  229 191   Cardiac Enzymes: No results for input(s): CKTOTAL, CKMB, CKMBINDEX, TROPONINI in the last 168 hours. BNP (last 3 results)  Recent Labs  06/29/14 2207  BNP 181.4*    ProBNP (last 3 results) No results for input(s): PROBNP in the last 8760 hours.  CBG:  Recent Labs Lab 07/01/14 1707 07/01/14 2124 07/02/14 0526 07/02/14 0653 07/02/14 1232  GLUCAP 99 107* 79 85 94    Recent Results (from the past 240 hour(s))  Clostridium Difficile by PCR     Status: Abnormal   Collection Time: 07/01/14  3:11 AM  Result Value Ref Range Status   C difficile by pcr POSITIVE (A) NEGATIVE Final    Comment: CRITICAL RESULT CALLED TO, READ BACK BY AND VERIFIED WITH: T.SPENCER,RN 07/01/14 0817 BY BSLADE      Studies: No results found.  Scheduled Meds: . calcium carbonate  2 tablet Oral TID WC  . cholecalciferol  1,000 Units Oral Daily  . clopidogrel  75 mg  Oral Q breakfast  . darifenacin  7.5 mg Oral Daily  . donepezil  10 mg Oral QHS  . folic acid  1 mg Oral Daily  . heparin subcutaneous  5,000 Units Subcutaneous Q8H  . insulin aspart  0-9 Units Subcutaneous TID WC  . isosorbide mononitrate  30 mg Oral Daily  . memantine  28 mg Oral Daily  . metoprolol  25 mg Oral BID  . metroNIDAZOLE  500 mg Oral 3 times per day  . multivitamin with minerals  1 tablet Oral Daily  . nystatin   Topical TID  . saccharomyces boulardii  250 mg Oral BID  . sodium chloride  3 mL Intravenous Q12H  . thiamine  100 mg Oral Daily   Continuous Infusions: . sodium chloride 50 mL/hr at 07/02/14 [id number]    Principal Problem:   Acute renal failure Active Problems:   Chronic kidney disease (CKD), stage II (mild)   Dyslipidemia   Chronic combined systolic and diastolic heart failure   Dementia   Edema   CHF (congestive heart failure)   Failure to thrive in adult   CHIU, STEPHEN K  [facility] Pager [phone]. If 7PM-7AM, please contact night-coverage at [email or web address], password [email or web address] 07/02/2014, 5:00 PM  LOS: 3 days

## 2014-07-02 NOTE — Progress Notes (Signed)
Pt alert to self, pt inc of bowel and bladder, vss, pt stable

## 2014-07-03 LAB — BASIC METABOLIC PANEL
ANION GAP: 4 — AB (ref 5–15)
BUN: 29 mg/dL — AB (ref 6–23)
CO2: 23 mmol/L (ref 19–32)
CREATININE: 1.14 mg/dL — AB (ref 0.50–1.10)
Calcium: 8.6 mg/dL (ref 8.4–10.5)
Chloride: 120 mmol/L — ABNORMAL HIGH (ref 96–112)
GFR, EST AFRICAN AMERICAN: 47 mL/min — AB (ref >90)
GFR, EST NON AFRICAN AMERICAN: 40 mL/min — AB (ref >90)
Glucose, Bld: 81 mg/dL (ref 70–99)
Potassium: 3.8 mmol/L (ref 3.5–5.1)
SODIUM: 147 mmol/L — AB (ref 135–145)

## 2014-07-03 LAB — CBC
HEMATOCRIT: 24 % — AB (ref 36.0–46.0)
HEMOGLOBIN: 8 g/dL — AB (ref 12.0–15.0)
MCH: 28.8 pg (ref 26.0–34.0)
MCHC: 33.3 g/dL (ref 30.0–36.0)
MCV: 86.3 fL (ref 78.0–100.0)
Platelets: 161 10*3/uL (ref 150–400)
RBC: 2.78 MIL/uL — ABNORMAL LOW (ref 3.87–5.11)
RDW: 15 % (ref 11.5–15.5)
WBC: 8.8 10*3/uL (ref 4.0–10.5)

## 2014-07-03 LAB — GLUCOSE, CAPILLARY
GLUCOSE-CAPILLARY: 80 mg/dL (ref 70–99)
GLUCOSE-CAPILLARY: 94 mg/dL (ref 70–99)

## 2014-07-03 MED ORDER — CEFUROXIME AXETIL 250 MG PO TABS
250.0000 mg | ORAL_TABLET | Freq: Two times a day (BID) | ORAL | Status: DC
  Administered 2014-07-03: 250 mg via ORAL
  Filled 2014-07-03 (×2): qty 1

## 2014-07-03 MED ORDER — METRONIDAZOLE 500 MG PO TABS: 500.0000 mg | ORAL_TABLET | Freq: Three times a day (TID) | ORAL | Status: DC

## 2014-07-03 MED ORDER — CEFUROXIME AXETIL 250 MG PO TABS: 250.0000 mg | ORAL_TABLET | Freq: Two times a day (BID) | ORAL | Status: DC

## 2014-07-03 MED ORDER — SACCHAROMYCES BOULARDII 250 MG PO CAPS: 250.0000 mg | ORAL_CAPSULE | Freq: Two times a day (BID) | ORAL | Status: DC

## 2014-07-03 NOTE — Progress Notes (Signed)
°   07/03/14 1200  Clinical Encounter Type  Visited With Patient  Visit Type Initial;Spiritual support  Spiritual Encounters  Spiritual Needs Emotional  Chaplain initiated visit.  Patient expressed no needs but commented that she was going to be moved for therapy soon.  Will follow if requested. Lillie Fragminnn Elmore Chaplain 07/03/2014 12:33 PM

## 2014-07-03 NOTE — Progress Notes (Signed)
[facility] to [facility]. [facility] by [facility] team

## 2014-07-03 NOTE — Clinical Social Work Placement (Addendum)
    Clinical Social Work Department CLINICAL SOCIAL WORK PLACEMENT NOTE 07/03/2014  Patient:  [name],[name]  Account Number:  [id number] Admit date:  06/29/2014  Clinical Social Worker:  Sharol HarnessPOONUM Alysson Geist, Theresia MajorsLCSWA  Date/time:  07/03/2014 11:15 AM  Clinical Social Work is seeking post-discharge placement for this patient at the following level of care:   SKILLED NURSING   (*CSW will update this form in Epic as items are completed)   07/03/2014  Patient/family provided with [facility] Department of Clinical Social Work's list of facilities offering this level of care within the geographic area requested by the patient (or if unable, by the patient's family).  07/03/2014  Patient/family informed of their freedom to choose among providers that offer the needed level of care, that participate in Medicare, Medicaid or managed care program needed by the patient, have an available bed and are willing to accept the patient.  07/03/2014  Patient/family informed of [facility]' ownership interest in [facility], as well as of the fact that they are under no obligation to receive care at this facility.  PASARR submitted to EDS on existing PASARR number received on   FL2 transmitted to all facilities in geographic area requested by pt/family on  07/03/2014 FL2 transmitted to all facilities within larger geographic area on   Patient informed that his/her managed care company has contracts with or will negotiate with  certain facilities, including the following:     Patient/family informed of bed offers received:   07/03/2014 Patient chooses bed at [facility] Physician recommends and patient chooses bed at    Patient to be transferred [facility]  on  07/03/2014  Patient to be transferred to facility by [facility] Patient and family notified of transfer on  07/03/2014 Name of family member notified:  Gaspar ColaJaqueline (pt daughter)  The following physician request were entered in Epic: Physician  Request  Please sign FL2.    Additional CommentsSharol Harness:    Traven Davids, LCSWA [phone]

## 2014-07-03 NOTE — Clinical Social Work Psychosocial (Signed)
     Clinical Social Work Department BRIEF PSYCHOSOCIAL ASSESSMENT 07/03/2014  Patient:  [name],[name]     Account Number:  [id number]     Admit date:  06/29/2014  Clinical Social Worker:  Harless NakayamaAMBELAL,Damarien Nyman, LCSWA  Date/Time:  07/03/2014 11:11 AM  Referred by:  Physician  Date Referred:  07/03/2014 Referred for  SNF Placement   Other Referral:   Interview type:  Patient Other interview type:    PSYCHOSOCIAL DATA Living Status:  WITH ADULT CHILDREN Admitted from facility:   Level of care:   Primary support name:  [name] Primary support relationship to patient:  CHILD, ADULT Degree of support available:   Pt has good family support    CURRENT CONCERNS Current Concerns  Post-Acute Placement   Other Concerns:    SOCIAL WORK ASSESSMENT / PLAN CSW viisted pt room and discussed SNF recommendation. Pt reports that she is feeling well. Pt understanding of SNF recommendation and informed CSW she has been to rehab before. Pt unable to recall previously facility but expressed that she feels the facility was a great help to her during previous stay. Pt reports that she is willing to dc to SNF again but CSW should speak with her daughter about specifics. CSW attempted to call pt daughter but unable to reach and left voicemail. Per chart review, pt daughter may be having difficult with pt care at home. CSW to discuss potential for long term placement when pt daughter returns phone call.   Assessment/plan status:  Psychosocial Support/Ongoing Assessment of Needs Other assessment/ plan:   Information/referral to community resources:   SNF list to be provided with bed offers    PATIENTS/FAMILYS RESPONSE TO PLAN OF CARE: Pt very pleasant and cooperative. Pt is agreeable to SNF search but asking that CSW confirm plans with her daughter.    Shawn Carattini, LCSWA  [phone]

## 2014-07-03 NOTE — Progress Notes (Signed)
Physical Therapy Treatment Patient Details Name: August LuzGladys J Frenkel MRN: [id number] DOB: 07/27/1919 Today's Date: 07/03/2014    History of Present Illness 81 yo female with onset of BLE edema after going home from SNF with her daughter.  Pt was in acute renal failure, incontinent of urine.  PMHx:  constipation, HTN, COPD, PNA, dementia, spinal stenosis, CHF, CKD, HLD    PT Comments    Pt with very limited mobility.  Follow Up Recommendations  SNF     Equipment Recommendations  None recommended by PT    Recommendations for Other Services       Precautions / Restrictions Precautions Precautions: Fall    Mobility  Bed Mobility Overal bed mobility: Needs Assistance Bed Mobility: Rolling;Supine to Sit;Sit to Supine Rolling: Total assist   Supine to sit: +2 for physical assistance;Max assist Sit to supine: +2 for physical assistance;Max assist   General bed mobility comments: Assist for all aspects.  Transfers                 General transfer comment: Unable to achieve adequate standing with +2 total assist for a safe pivot to chair.  Ambulation/Gait                 Stairs            Wheelchair Mobility    Modified Rankin (Stroke Patients Only)       Balance Overall balance assessment: Needs assistance Sitting-balance support: Bilateral upper extremity supported;Feet unsupported Sitting balance-Leahy Scale: Poor Sitting balance - Comments: Pt sat EOB x 6-7 minutes with min A. Pt keeps LLE held straight out. Will let LLE bend at knee in order to get foot to floor with cues but then returns it to extended position. No signs of pain. Postural control: Posterior lean                          Cognition Arousal/Alertness: Awake/alert Behavior During Therapy: Flat affect Overall Cognitive Status: History of cognitive impairments - at baseline       Memory: Decreased recall of precautions;Decreased short-term memory               Exercises      General Comments        Pertinent Vitals/Pain Pain Assessment: Faces Pain Score: 0-No pain    Home Living                      Prior Function            PT Goals (current goals can now be found in the care plan section) Progress towards PT goals: Not progressing toward goals - comment (Pt remains weak and with poor cognition and balance.)    Frequency  Min 2X/week    PT Plan Current plan remains appropriate    Co-evaluation             End of Session Equipment Utilized During Treatment: Gait belt Activity Tolerance: Patient limited by fatigue Patient left: in bed;with call bell/phone within reach;with nursing/sitter in room     Time: 1219-1232 PT Time Calculation (min) (ACUTE ONLY): 13 min  Charges:  $Therapeutic Activity: 8-22 mins                    G Codes:      Daquon Greenleaf 07/03/2014, 1:51 PM  [employer] Carrieann Spielberg PT [phone]

## 2014-07-03 NOTE — Progress Notes (Signed)
Nutrition Brief Note  RD consulted for Poor PO intake. Per RN and nurse tech, pt is eating 70-95% of all meals. Intake determined adequate by this RD based on patient's age and weight. Weight has been trending up per weight history below.   Wt Readings from Last 15 Encounters:  07/03/14 141 lb 8 oz (64.184 kg)  06/03/14 138 lb (62.596 kg)  04/22/14 134 lb (60.782 kg)  02/18/14 135 lb (61.236 kg)  12/24/13 142 lb (64.411 kg)  10/22/13 143 lb (64.864 kg)  08/13/13 134 lb (60.782 kg)  05/01/13 122 lb 5.7 oz (55.5 kg)  04/22/13 128 lb 3.2 oz (58.151 kg)  01/22/13 122 lb (55.339 kg)  12/24/12 128 lb (58.06 kg)    Body mass index is 27.63 kg/(m^2). Patient meets criteria for Overweight based on current BMI.  Labs and medications reviewed.   No nutrition interventions warranted at this time. If nutrition issues arise, please re-consult RD.   Ian Malkineanne Barnett RD, LDN Inpatient Clinical Dietitian Pager: [phone] After Hours Pager: [phone]

## 2014-07-03 NOTE — Progress Notes (Signed)
CSW (Clinical Social Worker) prepared pt dc packet and placed with shadow chart. CSW arranged non-emergent ambulance transport. Pt, pt family, pt nurse, and facility informed. CSW signing off.  Lema Heinkel, LCSWA [phone]  

## 2014-07-03 NOTE — Discharge Summary (Signed)
Physician Discharge Summary  [name] ZOX:[id number] DOB: 07/27/1919 DOA: 06/29/2014  PCP: No primary care provider on file.  Admit date: 06/29/2014 Discharge date: 07/03/2014  Time spent: 25 minutes  Recommendations for Outpatient Follow-up:  1. Follow up with PCP in 1-2 weeks 2. Strict hand washing/contact precautions 3. Continue 11 additional days of flagyl on discharge 4. Continue 4 additional days of ceftin on discharge 5. L heel pressure ulcer management per below  Discharge Diagnoses:  Principal Problem:   Acute renal failure Active Problems:   Chronic kidney disease (CKD), stage II (mild)   Dyslipidemia   Chronic combined systolic and diastolic heart failure   Dementia   Edema   CHF (congestive heart failure)   Failure to thrive in adult   Discharge Condition: Improved  Diet recommendation: Heart healthy, diabetic  Filed Weights   07/01/14 0427 07/02/14 0457 07/03/14 0508  Weight: 62.886 kg (138 lb 10.2 oz) 64.5 kg (142 lb 3.2 oz) 64.184 kg (141 lb 8 oz)    History of present illness:  Please see admit h and p from 2/1 for details. Briefly, pt presents with initial complaints of LE edema with progressive weakness. The patient normally resides with her daughter who is concerned she is no longer able to care for the patient. The patient was found to have acute renal failure. She was admitted for further work up.  Hospital Course:  1. Cdiff colitis  1. Pt noted to have multiple bouts of diarrhea during this course 2. Cdiff is positive 3. Started patient on flagyl and florastor with successful transition to PO abx 4. Patient was continued on gentle IVF and supportive care 2. Acute on chronic renal failure 1. Renal function improved with gentle hydration 3. Combined systolic, diastolic CHF 1. Remained Euvolemic 4. Dementia with failure to thrive 1. Continue to encourage PO as tolerated 2. Level of mentation improved 5. Acute on chronic anemia 1. Continue  to monitor closely and transfuse as needed 6. COPD 1. Remained stable 2. No wheezing on exam 3. On min o2 support 7. Spinal stenosis 1. Seems stable currently 8. DVT prophylaxis 1. Heparin subQ 9. Stage 1 pressure ulcer of L heel 1. WOC was consulted 2. Recs as follows: Foam dressing to protect and promote healing to buttock wound. Prevalon boot to reduce pressure to heel. No topical care required at this time  Consultations:  none  Discharge Exam: Filed Vitals:   07/02/14 0934 07/02/14 1359 07/02/14 2212 07/03/14 0508  BP: 137/63 126/54 130/58 130/55  Pulse: 68 71 70 68  Temp: 98.2 F (36.8 C) 98.1 F (36.7 C) 98 F (36.7 C) 97.8 F (36.6 C)  TempSrc: Oral Oral Oral Oral  Resp: 20 16 17 16   Height:      Weight:    64.184 kg (141 lb 8 oz)  SpO2: 98% 100% 98% 97%    General: Awake, in nad Cardiovascular: regular, s1, s2 Respiratory: normal resp effort, no wheezing  Discharge Instructions     Medication List    STOP taking these medications        lisinopril 2.5 MG tablet  Commonly known as:  PRINIVIL,ZESTRIL      TAKE these medications        cefUROXime 250 MG tablet  Commonly known as:  CEFTIN  Take 1 tablet (250 mg total) by mouth 2 (two) times daily with a meal.     cholecalciferol 1000 UNITS tablet  Commonly known as:  VITAMIN D  Take 1,000  Units by mouth daily. For osteoporosis     clopidogrel 75 MG tablet  Commonly known as:  PLAVIX  Take 1 tablet (75 mg total) by mouth daily with breakfast.     donepezil 10 MG tablet  Commonly known as:  ARICEPT  Take 1 tablet (10 mg total) by mouth at bedtime.     furosemide 20 MG tablet  Commonly known as:  LASIX  Take 1 tablet (20 mg total) by mouth daily.     isosorbide mononitrate 30 MG 24 hr tablet  Commonly known as:  IMDUR  Take 1 tablet (30 mg total) by mouth daily.     memantine 28 MG Cp24 24 hr capsule  Commonly known as:  NAMENDA XR  Take 28 mg by mouth daily.     metoprolol tartrate  25 MG tablet  Commonly known as:  LOPRESSOR  Take 1 tablet (25 mg total) by mouth 2 (two) times daily.     metroNIDAZOLE 500 MG tablet  Commonly known as:  FLAGYL  Take 1 tablet (500 mg total) by mouth every 8 (eight) hours.     saccharomyces boulardii 250 MG capsule  Commonly known as:  FLORASTOR  Take 1 capsule (250 mg total) by mouth 2 (two) times daily.     solifenacin 5 MG tablet  Commonly known as:  VESICARE  Take 5 mg by mouth daily.       No Known Allergies Follow-up Information    Follow up with Follow up with PCP in 1-2 weeks In 1 week.       The results of significant diagnostics from this hospitalization (including imaging, microbiology, ancillary and laboratory) are listed below for reference.    Significant Diagnostic Studies: Dg Chest 2 View  06/29/2014   CLINICAL DATA:  81 year old female with shortness of breath for 2 days. Confusion.  EXAM: CHEST  2 VIEW  COMPARISON:  Radiographs 04/28/2013.  Chest CT 05/13/2013  FINDINGS: Mild cardiomegaly is unchanged, there is tortuosity of the thoracic aorta. This is unchanged from prior exam. There coarse interstitial markings, decreased interstitial opacities compared to prior. Minimal atelectasis at the right lung base. No confluent airspace disease to suggest pneumonia. There is no large pleural effusion or pneumothorax. No pulmonary edema. Underlying osteopenia and severe degenerative change of both shoulders, unchanged.  IMPRESSION: Minimal atelectasis at the right lung base. Prominent interstitial markings are improved from prior exam and may be chronic.   Electronically Signed   By: Rubye Oaks M.D.   On: 06/29/2014 21:48    Microbiology: Recent Results (from the past 240 hour(s))  Clostridium Difficile by PCR     Status: Abnormal   Collection Time: 07/01/14  3:11 AM  Result Value Ref Range Status   C difficile by pcr POSITIVE (A) NEGATIVE Final    Comment: CRITICAL RESULT CALLED TO, READ BACK BY AND VERIFIED  WITH: T.SPENCER,RN 07/01/14 0817 BY BSLADE      Labs: Basic Metabolic Panel:  Recent Labs Lab 06/29/14 2130 06/30/14 0100 07/01/14 0530 07/02/14 0440 07/03/14 0434  NA 138 136 141 145 147*  K 3.7 3.5 3.8 3.7 3.8  CL 106 107 114* 119* 120*  CO2  --  20 23 18* 23  GLUCOSE 97 95 78 86 81  BUN 97* 93* 68* 46* 29*  CREATININE 2.60* 2.51* 1.73* 1.33* 1.14*  CALCIUM  --  8.6 8.6 8.5 8.6   Liver Function Tests:  Recent Labs Lab 06/30/14 0100  AST 25  ALT 14  ALKPHOS 60  BILITOT 0.4  PROT 6.9  ALBUMIN 3.2*   No results for input(s): LIPASE, AMYLASE in the last 168 hours. No results for input(s): AMMONIA in the last 168 hours. CBC:  Recent Labs Lab 06/29/14 2119 06/29/14 2130 06/30/14 0100 07/01/14 0530 07/03/14 0434  WBC 9.9  --  9.4 6.7 8.8  NEUTROABS 8.0*  --   --   --   --   HGB 9.4* 9.9* 9.4* 8.9* 8.0*  HCT 28.3* 29.0* 28.1* 26.1* 24.0*  MCV 84.7  --  85.9 85.9 86.3  PLT 230  --  229 191 161   Cardiac Enzymes: No results for input(s): CKTOTAL, CKMB, CKMBINDEX, TROPONINI in the last 168 hours. BNP: BNP (last 3 results)  Recent Labs  06/29/14 2207  BNP 181.4*    ProBNP (last 3 results) No results for input(s): PROBNP in the last 8760 hours.  CBG:  Recent Labs Lab 07/02/14 1620 07/02/14 1741 07/02/14 2202 07/03/14 0608 07/03/14 1123  GLUCAP 103* 97 90 80 94    Signed:  Mehtaab Mayeda K  [facility] 07/03/2014, 2:00 PM

## 2014-07-03 NOTE — Progress Notes (Signed)
CSW (Clinical Child psychotherapistocial Worker) able to speak with pt daughter Gaspar ColaJaqueline. She informed CSW that [facility]. Pt has been at home with daughter for only about 2 weeks. Pt daughter expressed that pt was comfortable at facility but for financial reasons she does not want pt to return to this facility. CSW explained SNF referral process and pt daughter also agreeable to [facility] being [facility]. Pt daughter aware of plan for dc today and that she will need to choose a facility. CSW will need to investigate further but it seems as though pt will not have any days left for insurance coverage. CSW to get response from facilities and then speak further with pt daughter.  Ahkeem Goede, LCSWA [phone]

## 2014-07-04 LAB — URINE CULTURE: Colony Count: >=100000

## 2014-07-06 ENCOUNTER — Non-Acute Institutional Stay (SKILLED_NURSING_FACILITY): Payer: Medicare Other | Admitting: Internal Medicine

## 2014-07-06 DIAGNOSIS — I5042 Chronic combined systolic (congestive) and diastolic (congestive) heart failure: Secondary | ICD-10-CM

## 2014-07-06 DIAGNOSIS — E559 Vitamin D deficiency, unspecified: Secondary | ICD-10-CM

## 2014-07-06 DIAGNOSIS — A047 Enterocolitis due to Clostridium difficile: Secondary | ICD-10-CM

## 2014-07-06 DIAGNOSIS — G309 Alzheimer's disease, unspecified: Secondary | ICD-10-CM

## 2014-07-06 DIAGNOSIS — F0150 Vascular dementia without behavioral disturbance: Secondary | ICD-10-CM

## 2014-07-06 DIAGNOSIS — J439 Emphysema, unspecified: Secondary | ICD-10-CM | POA: Diagnosis not present

## 2014-07-06 DIAGNOSIS — I110 Hypertensive heart disease with heart failure: Secondary | ICD-10-CM | POA: Diagnosis not present

## 2014-07-06 DIAGNOSIS — N179 Acute kidney failure, unspecified: Secondary | ICD-10-CM

## 2014-07-06 DIAGNOSIS — A0472 Enterocolitis due to Clostridium difficile, not specified as recurrent: Secondary | ICD-10-CM

## 2014-07-06 DIAGNOSIS — I2510 Atherosclerotic heart disease of native coronary artery without angina pectoris: Secondary | ICD-10-CM | POA: Diagnosis not present

## 2014-07-06 DIAGNOSIS — R627 Adult failure to thrive: Secondary | ICD-10-CM

## 2014-07-06 DIAGNOSIS — I509 Heart failure, unspecified: Secondary | ICD-10-CM | POA: Diagnosis not present

## 2014-07-06 DIAGNOSIS — F0280 Dementia in other diseases classified elsewhere without behavioral disturbance: Secondary | ICD-10-CM

## 2014-07-06 DIAGNOSIS — L89891 Pressure ulcer of other site, stage 1: Secondary | ICD-10-CM | POA: Diagnosis not present

## 2014-07-06 NOTE — Progress Notes (Signed)
MRN: [id number] Name: [name]  Sex: female Age: 81 y.o. DOB: 01/05/1920  PSC #: [facility] Facility/Room:307 Level Of Care: SNF Provider: Merrilee Seashore D Emergency Contacts: Extended Emergency Contact Information Primary Emergency Contact: Cheatham,Jaqueline Address: [address]          [place] [address] [place] Home Phone: [phone] Mobile Phone: [phone] Relation: Daughter    Allergies: Review of patient's allergies indicates no known allergies.  Chief Complaint  Patient presents with  . New Admit To SNF    HPI: Patient is 81 y.o. female who was hospitalized for weakness and dementia, found to have C diff and admitted to SNF for weakness and for care.  Past Medical History  Diagnosis Date  . Frequency of urination   . Incontinence   . Constipation   . Hypertension   . COPD (chronic obstructive pulmonary disease)   . Pneumonia   . Dementia   . Spinal stenosis   . CHF (congestive heart failure)     Past Surgical History  Procedure Laterality Date  . Colonoscopy  1991      Medication List       This list is accurate as of: 07/06/14 11:59 PM.  Always use your most recent med list.               cefUROXime 250 MG tablet  Commonly known as:  CEFTIN  Take 1 tablet (250 mg total) by mouth 2 (two) times daily with a meal.     cholecalciferol 1000 UNITS tablet  Commonly known as:  VITAMIN D  Take 1,000 Units by mouth daily. For osteoporosis     clopidogrel 75 MG tablet  Commonly known as:  PLAVIX  Take 1 tablet (75 mg total) by mouth daily with breakfast.     donepezil 10 MG tablet  Commonly known as:  ARICEPT  Take 1 tablet (10 mg total) by mouth at bedtime.     furosemide 20 MG tablet  Commonly known as:  LASIX  Take 1 tablet (20 mg total) by mouth daily.     isosorbide mononitrate 30 MG 24 hr tablet  Commonly known as:  IMDUR  Take 1 tablet (30 mg total) by mouth daily.     memantine 28 MG Cp24 24 hr  capsule  Commonly known as:  NAMENDA XR  Take 28 mg by mouth daily.     metoprolol tartrate 25 MG tablet  Commonly known as:  LOPRESSOR  Take 1 tablet (25 mg total) by mouth 2 (two) times daily.     metroNIDAZOLE 500 MG tablet  Commonly known as:  FLAGYL  Take 1 tablet (500 mg total) by mouth every 8 (eight) hours.     saccharomyces boulardii 250 MG capsule  Commonly known as:  FLORASTOR  Take 1 capsule (250 mg total) by mouth 2 (two) times daily.     solifenacin 5 MG tablet  Commonly known as:  VESICARE  Take 5 mg by mouth daily.        No orders of the defined types were placed in this encounter.    Immunization History  Administered Date(s) Administered  . Pneumococcal Polysaccharide-23 05/29/1992    History  Substance Use Topics  . Smoking status: Former Smoker    Quit date: 09/27/1978  . Smokeless tobacco: Not on file  . Alcohol Use: No    Family history is noncontributory    Review of    UTO   Filed Vitals:   07/06/14  1307  BP: 139/82  Pulse: 88  Temp: 99.2 F (37.3 C)  Resp: 20    Physical Exam  GENERAL APPEARANCE: Alert,non conversant,  No acute distress.  SKIN: No diaphoresis rash HEAD: Normocephalic, atraumatic  EYES: Conjunctiva/lids clear. Pupils round, reactive. EOMs intact.  EARS: External exam WNL, canals clear. Hearing grossly normal.  NOSE: No deformity or discharge.  MOUTH/THROAT: Lips w/o lesions  RESPIRATORY: Breathing is even, unlabored. Lung sounds are clear   CARDIOVASCULAR: Heart RRR no murmurs, rubs or gallops.+o peripheral edema.   GASTROINTESTINAL: Abdomen is soft, non-tender, not distended w/ normal bowel sounds. GENITOURINARY: Bladder non tender, not distended  MUSCULOSKELETAL: No abnormal joints or musculature NEUROLOGIC:  Cranial nerves 2-12 grossly intact  PSYCHIATRIC:dementia, no behavioral issues  Patient Active Problem List   Diagnosis Date Noted  . Clostridium difficile diarrhea 07/08/2014  . Pressure ulcer  of foot, stage 1 07/08/2014  . CAD (coronary artery disease) 07/08/2014  . Acute renal failure 06/30/2014  . Failure to thrive in adult 06/30/2014  . Dementia 06/29/2014  . Edema 06/29/2014  . CHF (congestive heart failure) 06/29/2014  . UI (urinary incontinence) 06/04/2014  . Spinal stenosis of lumbar region 10/22/2013  . Chronic combined systolic and diastolic heart failure 08/13/2013  . Hypertensive heart disease 08/13/2013  . Urinary incontinence due to cognitive impairment 08/13/2013  . Vitamin D insufficiency 08/13/2013  . Hypocalcemia 05/27/2013  . ACS (acute coronary syndrome) 04/25/2013  . Anxiety state, unspecified 04/22/2013  . Depressive disorder, not elsewhere classified 04/22/2013  . COPD (chronic obstructive pulmonary disease) with emphysema 04/22/2013  . Hallucinations 04/22/2013  . Abnormality of gait 04/22/2013  . Mixed Alzheimer's and vascular dementia 01/22/2013  . Routine general medical examination at a health care facility 01/22/2013  . Chronic kidney disease (CKD), stage II (mild) 01/22/2013  . Dyslipidemia 01/22/2013  . Cartilage disorder 12/24/2012  . Constipation     CBC    Component Value Date/Time   WBC 8.8 07/03/2014 0434   WBC 12.0* 04/16/2013 1447   RBC 2.78* 07/03/2014 0434   RBC 3.09* 06/30/2014 1130   RBC 3.98 04/16/2013 1447   HGB 8.0* 07/03/2014 0434   HCT 24.0* 07/03/2014 0434   PLT 161 07/03/2014 0434   MCV 86.3 07/03/2014 0434   LYMPHSABS 1.1 06/29/2014 2119   LYMPHSABS 2.2 04/16/2013 1447   MONOABS 0.8 06/29/2014 2119   EOSABS 0.0 06/29/2014 2119   EOSABS 0.0 04/16/2013 1447   BASOSABS 0.0 06/29/2014 2119   BASOSABS 0.0 04/16/2013 1447    CMP     Component Value Date/Time   NA 147* 07/03/2014 0434   NA 144 04/16/2013 1447   K 3.8 07/03/2014 0434   CL 120* 07/03/2014 0434   CO2 23 07/03/2014 0434   GLUCOSE 81 07/03/2014 0434   GLUCOSE 91 04/16/2013 1447   BUN 29* 07/03/2014 0434   BUN 25 04/16/2013 1447   CREATININE  1.14* 07/03/2014 0434   CALCIUM 8.6 07/03/2014 0434   PROT 6.9 06/30/2014 0100   PROT 6.9 04/16/2013 1447   ALBUMIN 3.2* 06/30/2014 0100   AST 25 06/30/2014 0100   ALT 14 06/30/2014 0100   ALKPHOS 60 06/30/2014 0100   BILITOT 0.4 06/30/2014 0100   GFRNONAA 40* 07/03/2014 0434   GFRAA 47* 07/03/2014 0434    Assessment and Plan  Clostridium difficile diarrhea 1. Pt noted to have multiple bouts of diarrhea during this course 2. Cdiff is positive 3. Started patient on flagyl and florastor with successful transition to PO abx  4. Patient was continued on gentle IVF and supportive care   Acute renal failure 5. Renal function improved with gentle hydration   Pressure ulcer of foot, stage 1 6. Recs as follows: Foam dressing to protect and promote healing to buttock wound. Prevalon boot to reduce pressure to heel. No topical care required at this time   Mixed Alzheimer's and vascular dementia Aricept and namenda to continue;progressed until family can't care with her any longer   Chronic combined systolic and diastolic heart failure Cont bblocker and lasi   Hypertensive heart disease Controlled on Imdur and Bblocker,lasi   Failure to thrive in adult 2/2 progressive dementia   Vitamin D insufficiency Continue supplementation   CAD (coronary artery disease) Imdur, bblocker,plavi   COPD (chronic obstructive pulmonary disease) with emphysema No wheezing, has needed some O2 support     Margit Hanks, MD

## 2014-07-08 ENCOUNTER — Encounter: Payer: Self-pay | Admitting: Internal Medicine

## 2014-07-08 DIAGNOSIS — A0472 Enterocolitis due to Clostridium difficile, not specified as recurrent: Secondary | ICD-10-CM | POA: Insufficient documentation

## 2014-07-08 DIAGNOSIS — L89891 Pressure ulcer of other site, stage 1: Secondary | ICD-10-CM | POA: Insufficient documentation

## 2014-07-08 DIAGNOSIS — I2510 Atherosclerotic heart disease of native coronary artery without angina pectoris: Secondary | ICD-10-CM | POA: Insufficient documentation

## 2014-07-08 NOTE — Assessment & Plan Note (Signed)
1. Renal function improved with gentle hydration

## 2014-07-08 NOTE — Assessment & Plan Note (Signed)
Controlled on Imdur and Bblocker,lasi

## 2014-07-08 NOTE — Assessment & Plan Note (Signed)
No wheezing, has needed some O2 support

## 2014-07-08 NOTE — Assessment & Plan Note (Signed)
1. Recs as follows: Foam dressing to protect and promote healing to buttock wound. Prevalon boot to reduce pressure to heel. No topical care required at this time

## 2014-07-08 NOTE — Assessment & Plan Note (Signed)
Continue supplementation  ?

## 2014-07-08 NOTE — Assessment & Plan Note (Signed)
2/2 progressive dementia

## 2014-07-08 NOTE — Assessment & Plan Note (Signed)
1. Pt noted to have multiple bouts of diarrhea during this course 2. Cdiff is positive 3. Started patient on flagyl and florastor with successful transition to PO abx 4. Patient was continued on gentle IVF and supportive care

## 2014-07-08 NOTE — Assessment & Plan Note (Signed)
Aricept and namenda to continue;progressed until family can't care with her any longer

## 2014-07-08 NOTE — Assessment & Plan Note (Signed)
Imdur, bblocker,plavi

## 2014-07-08 NOTE — Assessment & Plan Note (Signed)
Cont bblocker and [facility]

## 2014-07-10 ENCOUNTER — Ambulatory Visit: Payer: Medicare Other | Admitting: Internal Medicine

## 2014-07-31 ENCOUNTER — Non-Acute Institutional Stay (SKILLED_NURSING_FACILITY): Payer: Medicare Other | Admitting: Nurse Practitioner

## 2014-07-31 DIAGNOSIS — F0390 Unspecified dementia without behavioral disturbance: Secondary | ICD-10-CM

## 2014-07-31 DIAGNOSIS — J439 Emphysema, unspecified: Secondary | ICD-10-CM | POA: Diagnosis not present

## 2014-07-31 DIAGNOSIS — I509 Heart failure, unspecified: Secondary | ICD-10-CM

## 2014-07-31 DIAGNOSIS — A0472 Enterocolitis due to Clostridium difficile, not specified as recurrent: Secondary | ICD-10-CM

## 2014-07-31 DIAGNOSIS — I110 Hypertensive heart disease with heart failure: Secondary | ICD-10-CM

## 2014-07-31 DIAGNOSIS — N179 Acute kidney failure, unspecified: Secondary | ICD-10-CM

## 2014-07-31 DIAGNOSIS — A047 Enterocolitis due to Clostridium difficile: Secondary | ICD-10-CM | POA: Diagnosis not present

## 2014-07-31 NOTE — Progress Notes (Signed)
Patient ID: [name], female   DOB: 15-Jun-1919, 81 y.o.   MRN: [id number]   Nursing Home Location:  [facility]   Place of Service: SNF (31)  Chief Complaint  Patient presents with  . Medical Management of Chronic Issues    HPI:  [name] is a 81 year old female who is seen today for medical management for her chronic issues.  She has a medical history hypertension, COPD, CHF, dementia, and chronic kidney disease stage II.  She was recently hospitalized in February for progressive weakness and LE edema and found to be in acute renal failure.  While in the hospital she developed Cdiff and was discharged on Flagyl and Ceftin.  Those courses of antibiotics have been completed. She has no complaints today and denies pain or discomfort.   Past Medical History  Diagnosis Date  . Frequency of urination   . Incontinence   . Constipation   . Hypertension   . COPD (chronic obstructive pulmonary disease)   . Pneumonia   . Dementia   . Spinal stenosis   . CHF (congestive heart failure)    Past Surgical History  Procedure Laterality Date  . Colonoscopy  1991   Review of Systems:  Review of Systems  Constitutional: Negative for fever, chills, activity change, appetite change, fatigue and unexpected weight change.  HENT: Negative for congestion, postnasal drip, rhinorrhea and sore throat.   Respiratory: Negative for cough, shortness of breath and wheezing.   Cardiovascular: Negative for chest pain, palpitations and leg swelling.  Gastrointestinal: Negative for nausea, abdominal pain, diarrhea and constipation.  Genitourinary: Negative for dysuria, urgency and frequency.  Musculoskeletal: Negative for myalgias and joint swelling.  Skin: Negative for color change.  Neurological: Negative for dizziness, light-headedness and headaches.  Psychiatric/Behavioral: Negative for agitation. The patient is not nervous/anxious.     Medications: Patient's Medications  New  Prescriptions   No medications on file  Previous Medications   CHOLECALCIFEROL (VITAMIN D) 1000 UNITS TABLET    Take 1,000 Units by mouth daily. For osteoporosis   CLOPIDOGREL (PLAVIX) 75 MG TABLET    Take 1 tablet (75 mg total) by mouth daily with breakfast.   DONEPEZIL (ARICEPT) 10 MG TABLET    Take 1 tablet (10 mg total) by mouth at bedtime.   FUROSEMIDE (LASIX) 20 MG TABLET    Take 1 tablet (20 mg total) by mouth daily.   ISOSORBIDE MONONITRATE (IMDUR) 30 MG 24 HR TABLET    Take 1 tablet (30 mg total) by mouth daily.   MEMANTINE (NAMENDA XR) 28 MG CP24 24 HR CAPSULE    Take 28 mg by mouth daily.   METOPROLOL (LOPRESSOR) 25 MG TABLET    Take 1 tablet (25 mg total) by mouth 2 (two) times daily.   METRONIDAZOLE (FLAGYL) 500 MG TABLET    Take 1 tablet (500 mg total) by mouth every 8 (eight) hours.   SACCHAROMYCES BOULARDII (FLORASTOR) 250 MG CAPSULE    Take 1 capsule (250 mg total) by mouth 2 (two) times daily.   SOLIFENACIN (VESICARE) 5 MG TABLET    Take 5 mg by mouth daily.  Modified Medications   No medications on file  Discontinued Medications   CEFUROXIME (CEFTIN) 250 MG TABLET    Take 1 tablet (250 mg total) by mouth 2 (two) times daily with a meal.     Physical Exam:  Filed Vitals:   07/31/14 1403  BP: 133/77  Pulse: 77  Temp: 98.3 F (  36.8 C)  Resp: 18  Weight: 129 lb 12.8 oz (58.877 kg)    Physical Exam  Constitutional: She is oriented to person, place, and time. No distress.  HENT:  Head: Normocephalic and atraumatic.  Mouth/Throat: Oropharynx is clear and moist. No oropharyngeal exudate.  Neck: Normal range of motion. No JVD present.  Cardiovascular: Normal rate, regular rhythm, normal heart sounds and intact distal pulses.   No murmur heard. Pulmonary/Chest: Effort normal and breath sounds normal. She has no wheezes. She has no rales.  Abdominal: Soft. Bowel sounds are normal. She exhibits no distension. There is no tenderness. There is no rebound.   Lymphadenopathy:    She has no cervical adenopathy.  Neurological: She is alert and oriented to person, place, and time.  Skin: Skin is warm and dry. She is not diaphoretic.  Bilateral heel wounds, dressing CDI   Psychiatric:  Oriented to self and time, not oriented to situation.      Labs reviewed/Significant Diagnostic Results:  Basic Metabolic Panel:  Recent Labs  45/40/9800/08/11 0530 07/02/14 0440 07/03/14 0434  NA 141 145 147*  K 3.8 3.7 3.8  CL 114* 119* 120*  CO2 23 18* 23  GLUCOSE 78 86 81  BUN 68* 46* 29*  CREATININE 1.73* 1.33* 1.14*  CALCIUM 8.6 8.5 8.6   Liver Function Tests:  Recent Labs  06/30/14 0100  AST 25  ALT 14  ALKPHOS 60  BILITOT 0.4  PROT 6.9  ALBUMIN 3.2*   No results for input(s): LIPASE, AMYLASE in the last 8760 hours. No results for input(s): AMMONIA in the last 8760 hours. CBC:  Recent Labs  06/29/14 2119  06/30/14 0100 07/01/14 0530 07/03/14 0434  WBC 9.9  --  9.4 6.7 8.8  NEUTROABS 8.0*  --   --   --   --   HGB 9.4*  < > 9.4* 8.9* 8.0*  HCT 28.3*  < > 28.1* 26.1* 24.0*  MCV 84.7  --  85.9 85.9 86.3  PLT 230  --  229 191 161  < > = values in this interval not displayed. CBG:  Recent Labs  07/02/14 2202 07/03/14 0608 07/03/14 1123  GLUCAP 90 80 94   TSH:  Recent Labs  06/30/14 0100  TSH 1.342   A1C: Lab Results  Component Value Date   HGBA1C 5.5 06/30/2014   Lipid Panel: No results for input(s): CHOL, HDL, LDLCALC, TRIG, CHOLHDL, LDLDIRECT in the last 8760 hours.     Assessment/Plan 1. Hypertensive heart disease with heart failure Currently maintained on Imdur, metoprolol and lasix. Blood pressure is within desirable range.  Will obtain BMET   2. Pulmonary emphysema, unspecified emphysema type No shortness of breath or 02 needs.  No medications at this time.   3. Clostridium difficile diarrhea Resolved.  No diarrhea or fever.  Will obtain follow up CBC  4. Dementia, without behavioral  disturbance Patient on Aricept and Namenda. No significant decline in cognitive or functional status noted.   5. Acute renal failure, unspecified acute renal failure type Will obtain BMET.  Most likely was due to infection from c diff.  Maintain adequate hydration.   Labs/tests ordered CBC, BMET

## 2014-08-24 ENCOUNTER — Non-Acute Institutional Stay (SKILLED_NURSING_FACILITY): Payer: Medicare Other | Admitting: Internal Medicine

## 2014-08-24 ENCOUNTER — Encounter: Payer: Self-pay | Admitting: Internal Medicine

## 2014-08-24 DIAGNOSIS — N182 Chronic kidney disease, stage 2 (mild): Secondary | ICD-10-CM | POA: Diagnosis not present

## 2014-08-24 DIAGNOSIS — F0150 Vascular dementia without behavioral disturbance: Secondary | ICD-10-CM

## 2014-08-24 DIAGNOSIS — I2510 Atherosclerotic heart disease of native coronary artery without angina pectoris: Secondary | ICD-10-CM

## 2014-08-24 DIAGNOSIS — J439 Emphysema, unspecified: Secondary | ICD-10-CM | POA: Diagnosis not present

## 2014-08-24 DIAGNOSIS — I509 Heart failure, unspecified: Secondary | ICD-10-CM | POA: Diagnosis not present

## 2014-08-24 DIAGNOSIS — G309 Alzheimer's disease, unspecified: Secondary | ICD-10-CM | POA: Diagnosis not present

## 2014-08-24 DIAGNOSIS — E785 Hyperlipidemia, unspecified: Secondary | ICD-10-CM

## 2014-08-24 DIAGNOSIS — E559 Vitamin D deficiency, unspecified: Secondary | ICD-10-CM | POA: Diagnosis not present

## 2014-08-24 DIAGNOSIS — I5042 Chronic combined systolic (congestive) and diastolic (congestive) heart failure: Secondary | ICD-10-CM

## 2014-08-24 DIAGNOSIS — A047 Enterocolitis due to Clostridium difficile: Secondary | ICD-10-CM | POA: Diagnosis not present

## 2014-08-24 DIAGNOSIS — I110 Hypertensive heart disease with heart failure: Secondary | ICD-10-CM | POA: Diagnosis not present

## 2014-08-24 DIAGNOSIS — R627 Adult failure to thrive: Secondary | ICD-10-CM | POA: Diagnosis not present

## 2014-08-24 DIAGNOSIS — F0280 Dementia in other diseases classified elsewhere without behavioral disturbance: Secondary | ICD-10-CM

## 2014-08-24 DIAGNOSIS — A0472 Enterocolitis due to Clostridium difficile, not specified as recurrent: Secondary | ICD-10-CM

## 2014-08-24 NOTE — Progress Notes (Signed)
MRN: [id number] Name: [name] Else  Sex: female Age: 81 y.o. DOB: [birth year]  PSC #: Son[facility] Dandy Facility/Room: 307 Level Of Care: SNF Provider: Merrilee Seashore D Emergency Contacts: Extended Emergency Contact Information Primary Emergency Contact: Cheatham,Jaqueline Address: [address]          [place] [address] [place] Home Phone: [phone] Mobile Phone: [phone] Relation: Daughter  Code Status:   Allergies: Review of patient's allergies indicates no known allergies.  Chief Complaint  Patient presents with  . Discharge Note    HPI: Patient is 81 y.o. female who was admitted to [facility] for generalized weakness, dementia and C.diff who is now ready to be d/c to home.  Past Medical History  Diagnosis Date  . Frequency of urination   . Incontinence   . Constipation   . Hypertension   . COPD (chronic obstructive pulmonary disease)   . Pneumonia   . Dementia   . Spinal stenosis   . CHF (congestive heart failure)     Past Surgical History  Procedure Laterality Date  . Colonoscopy  1991      Medication List       This list is accurate as of: 08/24/14 11:47 AM.  Always use your most recent med list.               cholecalciferol 1000 UNITS tablet  Commonly known as:  VITAMIN D  Take 1,000 Units by mouth daily. For osteoporosis     clopidogrel 75 MG tablet  Commonly known as:  PLAVIX  Take 1 tablet (75 mg total) by mouth daily with breakfast.     donepezil 10 MG tablet  Commonly known as:  ARICEPT  Take 1 tablet (10 mg total) by mouth at bedtime.     furosemide 20 MG tablet  Commonly known as:  LASIX  Take 1 tablet (20 mg total) by mouth daily.     isosorbide mononitrate 30 MG 24 hr tablet  Commonly known as:  IMDUR  Take 1 tablet (30 mg total) by mouth daily.     memantine 28 MG Cp24 24 hr capsule  Commonly known as:  NAMENDA XR  Take 28 mg by mouth daily.     metoprolol tartrate 25 MG tablet  Commonly known as:   LOPRESSOR  Take 1 tablet (25 mg total) by mouth 2 (two) times daily.     metroNIDAZOLE 500 MG tablet  Commonly known as:  FLAGYL  Take 1 tablet (500 mg total) by mouth every 8 (eight) hours.     saccharomyces boulardii 250 MG capsule  Commonly known as:  FLORASTOR  Take 1 capsule (250 mg total) by mouth 2 (two) times daily.     solifenacin 5 MG tablet  Commonly known as:  VESICARE  Take 5 mg by mouth daily.        No orders of the defined types were placed in this encounter.    Immunization History  Administered Date(s) Administered  . Pneumococcal Polysaccharide-23 05/29/1992    History  Substance Use Topics  . Smoking status: Former Smoker    Quit date: 09/27/1978  . Smokeless tobacco: Not on file  . Alcohol Use: No    Filed Vitals:   08/24/14 1142  BP: 129/89  Pulse: 80  Temp: 98.1 F (36.7 C)  Resp: 18    Physical Exam  GENERAL APPEARANCE: Alert, conversant. No acute distress.  HEENT: Unremarkable. RESPIRATORY: Breathing is even, unlabored. Lung sounds are clear   CARDIOVASCULAR: Heart  RRR no murmurs, rubs or gallops. No peripheral edema.  GASTROINTESTINAL: Abdomen is soft, non-tender, not distended w/ normal bowel sounds.  NEUROLOGIC: Cranial nerves 2-12 grossly intact  Patient Active Problem List   Diagnosis Date Noted  . Clostridium difficile diarrhea 07/08/2014  . Pressure ulcer of foot, stage 1 07/08/2014  . CAD (coronary artery disease) 07/08/2014  . Acute renal failure 06/30/2014  . Failure to thrive in adult 06/30/2014  . Dementia 06/29/2014  . Edema 06/29/2014  . CHF (congestive heart failure) 06/29/2014  . UI (urinary incontinence) 06/04/2014  . Spinal stenosis of lumbar region 10/22/2013  . Chronic combined systolic and diastolic heart failure 08/13/2013  . Hypertensive heart disease 08/13/2013  . Urinary incontinence due to cognitive impairment 08/13/2013  . Vitamin D insufficiency 08/13/2013  . Hypocalcemia 05/27/2013  . ACS (acute  coronary syndrome) 04/25/2013  . Anxiety state, unspecified 04/22/2013  . Depressive disorder, not elsewhere classified 04/22/2013  . COPD (chronic obstructive pulmonary disease) with emphysema 04/22/2013  . Hallucinations 04/22/2013  . Abnormality of gait 04/22/2013  . Mixed Alzheimer's and vascular dementia 01/22/2013  . Routine general medical examination at a health care facility 01/22/2013  . Chronic kidney disease (CKD), stage II (mild) 01/22/2013  . Dyslipidemia 01/22/2013  . Cartilage disorder 12/24/2012  . Constipation     CBC    Component Value Date/Time   WBC 8.8 07/03/2014 0434   WBC 12.0* 04/16/2013 1447   RBC 2.78* 07/03/2014 0434   RBC 3.09* 06/30/2014 1130   RBC 3.98 04/16/2013 1447   HGB 8.0* 07/03/2014 0434   HCT 24.0* 07/03/2014 0434   PLT 161 07/03/2014 0434   MCV 86.3 07/03/2014 0434   LYMPHSABS 1.1 06/29/2014 2119   LYMPHSABS 2.2 04/16/2013 1447   MONOABS 0.8 06/29/2014 2119   EOSABS 0.0 06/29/2014 2119   EOSABS 0.0 04/16/2013 1447   BASOSABS 0.0 06/29/2014 2119   BASOSABS 0.0 04/16/2013 1447    CMP     Component Value Date/Time   NA 147* 07/03/2014 0434   NA 144 04/16/2013 1447   K 3.8 07/03/2014 0434   CL 120* 07/03/2014 0434   CO2 23 07/03/2014 0434   GLUCOSE 81 07/03/2014 0434   GLUCOSE 91 04/16/2013 1447   BUN 29* 07/03/2014 0434   BUN 25 04/16/2013 1447   CREATININE 1.14* 07/03/2014 0434   CALCIUM 8.6 07/03/2014 0434   PROT 6.9 06/30/2014 0100   PROT 6.9 04/16/2013 1447   ALBUMIN 3.2* 06/30/2014 0100   AST 25 06/30/2014 0100   ALT 14 06/30/2014 0100   ALKPHOS 60 06/30/2014 0100   BILITOT 0.4 06/30/2014 0100   GFRNONAA 40* 07/03/2014 0434   GFRAA 47* 07/03/2014 0434    Assessment and Plan  Pt is stable for discharge to home with HH/OT/PT.    Margit HanksALEXANDER, Recie Cirrincione D, MD

## 2014-09-10 ENCOUNTER — Encounter: Payer: Self-pay | Admitting: Internal Medicine

## 2014-09-10 NOTE — Addendum Note (Signed)
Addended by: Merrilee SeashoreALEXANDER, Royston Bekele D on: 09/10/2014 06:37 PM   Modules accepted: Orders

## 2014-09-16 ENCOUNTER — Telehealth: Payer: Self-pay | Admitting: *Deleted

## 2014-09-16 DIAGNOSIS — F0390 Unspecified dementia without behavioral disturbance: Secondary | ICD-10-CM | POA: Diagnosis not present

## 2014-09-16 DIAGNOSIS — L89629 Pressure ulcer of left heel, unspecified stage: Secondary | ICD-10-CM | POA: Diagnosis not present

## 2014-09-16 DIAGNOSIS — R609 Edema, unspecified: Secondary | ICD-10-CM | POA: Diagnosis not present

## 2014-09-16 DIAGNOSIS — N182 Chronic kidney disease, stage 2 (mild): Secondary | ICD-10-CM | POA: Diagnosis not present

## 2014-09-16 DIAGNOSIS — I5042 Chronic combined systolic (congestive) and diastolic (congestive) heart failure: Secondary | ICD-10-CM | POA: Diagnosis not present

## 2014-09-16 DIAGNOSIS — E785 Hyperlipidemia, unspecified: Secondary | ICD-10-CM | POA: Diagnosis not present

## 2014-09-16 NOTE — Telephone Encounter (Signed)
Spoke with Trey PaulaJeff Genevieve Norlander([facility] Nurse) concerning patient's wound care orders, he stated that he would be send orders by fax for Dr. Montez Moritaarter and would continue his regular wound care until he receives the orders back.

## 2014-09-25 ENCOUNTER — Encounter: Payer: Medicare Other | Admitting: Internal Medicine

## 2014-09-30 ENCOUNTER — Ambulatory Visit: Payer: Medicare Other | Admitting: Internal Medicine

## 2014-10-06 ENCOUNTER — Telehealth: Payer: Self-pay | Admitting: *Deleted

## 2014-10-06 NOTE — Telephone Encounter (Signed)
Received PT/OT Fax Order from [facility]. Faxed back stating patient needs an appointment Fax#: [phone]. Patient has canceled and no showed appointment. Patient needs to be seen.

## 2014-10-16 ENCOUNTER — Encounter: Payer: Self-pay | Admitting: Internal Medicine

## 2014-10-16 ENCOUNTER — Ambulatory Visit (INDEPENDENT_AMBULATORY_CARE_PROVIDER_SITE_OTHER): Payer: Medicare Other | Admitting: Internal Medicine

## 2014-10-16 VITALS — BP 116/68 | HR 71 | Temp 97.7°F | Resp 20

## 2014-10-16 DIAGNOSIS — L98499 Non-pressure chronic ulcer of skin of other sites with unspecified severity: Secondary | ICD-10-CM | POA: Diagnosis not present

## 2014-10-16 DIAGNOSIS — K5901 Slow transit constipation: Secondary | ICD-10-CM

## 2014-10-16 DIAGNOSIS — M4806 Spinal stenosis, lumbar region: Secondary | ICD-10-CM | POA: Diagnosis not present

## 2014-10-16 DIAGNOSIS — I5042 Chronic combined systolic (congestive) and diastolic (congestive) heart failure: Secondary | ICD-10-CM

## 2014-10-16 DIAGNOSIS — I10 Essential (primary) hypertension: Secondary | ICD-10-CM | POA: Diagnosis not present

## 2014-10-16 DIAGNOSIS — L089 Local infection of the skin and subcutaneous tissue, unspecified: Secondary | ICD-10-CM | POA: Diagnosis not present

## 2014-10-16 DIAGNOSIS — R627 Adult failure to thrive: Secondary | ICD-10-CM | POA: Diagnosis not present

## 2014-10-16 DIAGNOSIS — F0150 Vascular dementia without behavioral disturbance: Secondary | ICD-10-CM

## 2014-10-16 DIAGNOSIS — G309 Alzheimer's disease, unspecified: Secondary | ICD-10-CM

## 2014-10-16 DIAGNOSIS — M48061 Spinal stenosis, lumbar region without neurogenic claudication: Secondary | ICD-10-CM

## 2014-10-16 DIAGNOSIS — M25461 Effusion, right knee: Secondary | ICD-10-CM

## 2014-10-16 DIAGNOSIS — F0280 Dementia in other diseases classified elsewhere without behavioral disturbance: Secondary | ICD-10-CM | POA: Diagnosis not present

## 2014-10-16 MED ORDER — SACCHAROMYCES BOULARDII 250 MG PO CAPS: 250.0000 mg | ORAL_CAPSULE | Freq: Two times a day (BID) | ORAL | Status: DC

## 2014-10-16 MED ORDER — DOXYCYCLINE HYCLATE 100 MG PO CAPS: 100.0000 mg | ORAL_CAPSULE | Freq: Two times a day (BID) | ORAL | Status: DC

## 2014-10-16 NOTE — Progress Notes (Signed)
Patient ID: [name], female   DOB: [birth year], 81 y.o.   MRN: [id number]    Location:    [facility]   Place of Service:   OFFICE    Chief Complaint  Patient presents with  . Follow-up    Follow-up,[facility]    HPI:  She c/o right knee pain and left heel ulcer. She is being followed by [facility] wound care. No PT/OT since d/c as she was awaiting appt by PCP. She has intermittent pain in right knee which did help with Tylenol. Sedentary since d/c from SNF.   She had colitis which has resolved. No COPD exacerbation. She has dementia and is taking namenda and aricept. Daughter is the primary caregiver.  Pt is a poor historian due to dementia. Hx obtained from daughter  Past Medical History  Diagnosis Date  . Frequency of urination   . Incontinence   . Constipation   . Hypertension   . COPD (chronic obstructive pulmonary disease)   . Pneumonia   . Dementia   . Spinal stenosis   . CHF (congestive heart failure)     Past Surgical History  Procedure Laterality Date  . Colonoscopy  1991    Patient Care Team: Margit Hanks, MD as PCP - General (Internal Medicine)  History   Social History  . Marital Status: Widowed    Spouse Name: N/A  . Number of Children: N/A  . Years of Education: N/A   Occupational History  . Not on file.   Social History Main Topics  . Smoking status: Former Smoker    Quit date: 09/27/1978  . Smokeless tobacco: Not on file  . Alcohol Use: No  . Drug Use: No  . Sexual Activity: Not on file   Other Topics Concern  . Not on file   Social History Narrative     reports that she quit smoking about 36 years ago. She does not have any smokeless tobacco history on file. She reports that she does not drink alcohol or use illicit drugs.  No Known Allergies  Medications: Patient's Medications  New Prescriptions   No medications on file  Previous Medications   CHOLECALCIFEROL (VITAMIN D) 1000 UNITS TABLET    Take 1,000 Units by  mouth daily. For osteoporosis   CLOPIDOGREL (PLAVIX) 75 MG TABLET    Take 1 tablet (75 mg total) by mouth daily with breakfast.   DONEPEZIL (ARICEPT) 10 MG TABLET    Take 1 tablet (10 mg total) by mouth at bedtime.   FUROSEMIDE (LASIX) 20 MG TABLET    Take 1 tablet (20 mg total) by mouth daily.   ISOSORBIDE MONONITRATE (IMDUR) 30 MG 24 HR TABLET    Take 1 tablet (30 mg total) by mouth daily.   MEMANTINE (NAMENDA XR) 28 MG CP24 24 HR CAPSULE    Take 28 mg by mouth daily.   METOPROLOL (LOPRESSOR) 25 MG TABLET    Take 1 tablet (25 mg total) by mouth 2 (two) times daily.   SOLIFENACIN (VESICARE) 5 MG TABLET    Take 5 mg by mouth daily.  Modified Medications   No medications on file  Discontinued Medications   No medications on file    Review of Systems  Unable to perform ROS: Dementia    Filed Vitals:   10/16/14 1428  BP: 116/68  Pulse: 71  Temp: 97.7 F (36.5 C)  TempSrc: Oral  Resp: 20  SpO2: 91%   There is no weight on file  to calculate BMI.  Physical Exam  Constitutional: She is oriented to person, place, and time. She appears well-developed and well-nourished.  Lying on stretcher. Daughter present along with EMTs  HENT:  Mouth/Throat: Oropharynx is clear and moist. No oropharyngeal exudate.  Eyes: Pupils are equal, round, and reactive to light. No scleral icterus.  Neck: Neck supple. No tracheal deviation present. No thyromegaly present.  Cardiovascular: Normal rate, regular rhythm, normal heart sounds and intact distal pulses.  Exam reveals decreased pulses. Exam reveals no gallop and no friction rub.   No murmur heard. Pulses:      Dorsalis pedis pulses are 1+ on the right side, and 1+ on the left side.       Posterior tibial pulses are 0 on the right side, and 1+ on the left side.  R>L LE edema. no calf TTP. No carotid bruit b/l  Pulmonary/Chest: Effort normal and breath sounds normal. No stridor. No respiratory distress. She has no wheezes. She has no rales.    Abdominal: Soft. Bowel sounds are normal. She exhibits no distension and no mass. There is tenderness (LUQ). There is no rebound and no guarding.  Musculoskeletal: She exhibits edema and tenderness.       Right knee: She exhibits decreased range of motion, swelling, effusion and deformity. She exhibits no erythema and no bony tenderness. Tenderness found. Lateral joint line tenderness noted.       Left knee: She exhibits decreased range of motion, swelling and deformity. She exhibits no effusion. Tenderness found. Medial joint line tenderness noted.  Lymphadenopathy:    She has no cervical adenopathy.  Neurological: She is alert and oriented to person, place, and time. She has normal reflexes.  Skin: Skin is warm and dry. No rash noted.     Psychiatric: She has a normal mood and affect. Her behavior is normal.     Labs reviewed: No visits with results within 3 Month(s) from this visit. Latest known visit with results is:  Admission on 06/29/2014, Discharged on 07/03/2014  Component Date Value Ref Range Status  . WBC 06/29/2014 9.9  4.0 - 10.5 K/uL Final  . RBC 06/29/2014 3.34* 3.87 - 5.11 MIL/uL Final  . Hemoglobin 06/29/2014 9.4* 12.0 - 15.0 g/dL Final  . HCT 87/56/4332 28.3* 36.0 - 46.0 % Final  . MCV 06/29/2014 84.7  78.0 - 100.0 fL Final  . MCH 06/29/2014 28.1  26.0 - 34.0 pg Final  . MCHC 06/29/2014 33.2  30.0 - 36.0 g/dL Final  . RDW 95/18/8416 14.4  11.5 - 15.5 % Final  . Platelets 06/29/2014 230  150 - 400 K/uL Final  . Neutrophils Relative % 06/29/2014 81* 43 - 77 % Final  . Neutro Abs 06/29/2014 8.0* 1.7 - 7.7 K/uL Final  . Lymphocytes Relative 06/29/2014 11* 12 - 46 % Final  . Lymphs Abs 06/29/2014 1.1  0.7 - 4.0 K/uL Final  . Monocytes Relative 06/29/2014 8  3 - 12 % Final  . Monocytes Absolute 06/29/2014 0.8  0.1 - 1.0 K/uL Final  . Eosinophils Relative 06/29/2014 0  0 - 5 % Final  . Eosinophils Absolute 06/29/2014 0.0  0.0 - 0.7 K/uL Final  . Basophils Relative  06/29/2014 0  0 - 1 % Final  . Basophils Absolute 06/29/2014 0.0  0.0 - 0.1 K/uL Final  . Sodium 06/29/2014 138  135 - 145 mmol/L Final  . Potassium 06/29/2014 3.7  3.5 - 5.1 mmol/L Final  . Chloride 06/29/2014 106  96 - 112  mmol/L Final  . BUN 06/29/2014 97* 6 - 23 mg/dL Final  . Creatinine, Ser 06/29/2014 2.60* 0.50 - 1.10 mg/dL Final  . Glucose, Bld 09/81/1914 97  70 - 99 mg/dL Final  . Calcium, Ion 78/29/5621 1.16  1.13 - 1.30 mmol/L Final  . TCO2 06/29/2014 16  0 - 100 mmol/L Final  . Hemoglobin 06/29/2014 9.9* 12.0 - 15.0 g/dL Final  . HCT 30/86/5784 29.0* 36.0 - 46.0 % Final  . B Natriuretic Peptide 06/29/2014 181.4* 0.0 - 100.0 pg/mL Final  . TSH 06/30/2014 1.342  0.350 - 4.500 uIU/mL Final  . Hgb A1c MFr Bld 06/30/2014 5.5  4.8 - 5.6 % Final   Comment: (NOTE)         Pre-diabetes: 5.7 - 6.4         Diabetes: >6.4         Glycemic control for adults with diabetes: <7.0   . Mean Plasma Glucose 06/30/2014 111   Final   Comment: (NOTE) Performed At: [facility] [address] [place], [place] [id number] Mila Homer MD [phone]   . Sodium 06/30/2014 136  135 - 145 mmol/L Final  . Potassium 06/30/2014 3.5  3.5 - 5.1 mmol/L Final  . Chloride 06/30/2014 107  96 - 112 mmol/L Final  . CO2 06/30/2014 20  19 - 32 mmol/L Final  . Glucose, Bld 06/30/2014 95  70 - 99 mg/dL Final  . BUN 36/64/4034 93* 6 - 23 mg/dL Final  . Creatinine, Ser 06/30/2014 2.51* 0.50 - 1.10 mg/dL Final  . Calcium 74/25/9563 8.6  8.4 - 10.5 mg/dL Final  . Total Protein 06/30/2014 6.9  6.0 - 8.3 g/dL Final  . Albumin 87/56/4332 3.2* 3.5 - 5.2 g/dL Final  . AST 95/18/8416 25  0 - 37 U/L Final  . ALT 06/30/2014 14  0 - 35 U/L Final  . Alkaline Phosphatase 06/30/2014 60  39 - 117 U/L Final  . Total Bilirubin 06/30/2014 0.4  0.3 - 1.2 mg/dL Final  . GFR calc non Af Amer 06/30/2014 16* >90 mL/min Final  . GFR calc Af Amer 06/30/2014 18* >90 mL/min Final   Comment: (NOTE) The eGFR has been  calculated using the CKD EPI equation. This calculation has not been validated in all clinical situations. eGFR's persistently <90 mL/min signify possible Chronic Kidney Disease.   . Anion gap 06/30/2014 9  5 - 15 Final  . WBC 06/30/2014 9.4  4.0 - 10.5 K/uL Final  . RBC 06/30/2014 3.27* 3.87 - 5.11 MIL/uL Final  . Hemoglobin 06/30/2014 9.4* 12.0 - 15.0 g/dL Final  . HCT 60/63/0160 28.1* 36.0 - 46.0 % Final  . MCV 06/30/2014 85.9  78.0 - 100.0 fL Final  . MCH 06/30/2014 28.7  26.0 - 34.0 pg Final  . MCHC 06/30/2014 33.5  30.0 - 36.0 g/dL Final  . RDW 10/93/2355 14.3  11.5 - 15.5 % Final  . Platelets 06/30/2014 229  150 - 400 K/uL Final  . Glucose-Capillary 06/30/2014 93  70 - 99 mg/dL Final  . Vitamin D-32 20/25/4270 1796* 211 - 911 pg/mL Final   Performed at [facility]  . Folate 06/30/2014 >20.0   Final   Comment: (NOTE) Reference Ranges        Deficient:       0.4 - 3.3 ng/mL        Indeterminate:   3.4 - 5.4 ng/mL        Normal:              >  5.4 ng/mL Performed at [facility]   . Iron 06/30/2014 52  42 - 145 ug/dL Final  . TIBC 02/72/5366 252  250 - 470 ug/dL Final  . Saturation Ratios 06/30/2014 21  20 - 55 % Final  . UIBC 06/30/2014 200  125 - 400 ug/dL Final   Performed at [facility]  . Ferritin 06/30/2014 96  10 - 291 ng/mL Final   Performed at [facility]  . Retic Ct Pct 06/30/2014 1.3  0.4 - 3.1 % Final  . RBC. 06/30/2014 3.09* 3.87 - 5.11 MIL/uL Final  . Retic Count, Manual 06/30/2014 40.2  19.0 - 186.0 K/uL Final  . Fecal Occult Bld 07/01/2014 NEGATIVE  NEGATIVE Final  . Specimen Description 07/02/2014 URINE, CLEAN CATCH   Final  . Special Requests 07/02/2014 NONE   Final  . Colony Count 07/02/2014    Final                   Value:>=100,000 COLONIES/ML Performed at [facility]   . Culture 07/02/2014    Final                   Value:KLEBSIELLA OXYTOCA Performed at [facility]   . Report Status  07/02/2014 07/04/2014 FINAL   Final  . Organism ID, Bacteria 07/02/2014 KLEBSIELLA OXYTOCA   Final  . Color, Urine 07/02/2014 YELLOW  YELLOW Final  . APPearance 07/02/2014 TURBID* CLEAR Final  . Specific Gravity, Urine 07/02/2014 1.016  1.005 - 1.030 Final  . pH 07/02/2014 6.5  5.0 - 8.0 Final  . Glucose, UA 07/02/2014 NEGATIVE  NEGATIVE mg/dL Final  . Hgb urine dipstick 07/02/2014 MODERATE* NEGATIVE Final  . Bilirubin Urine 07/02/2014 NEGATIVE  NEGATIVE Final  . Ketones, ur 07/02/2014 NEGATIVE  NEGATIVE mg/dL Final  . Protein, ur 44/07/4740 NEGATIVE  NEGATIVE mg/dL Final  . Urobilinogen, UA 07/02/2014 0.2  0.0 - 1.0 mg/dL Final  . Nitrite 59/56/3875 NEGATIVE  NEGATIVE Final  . Leukocytes, UA 07/02/2014 LARGE* NEGATIVE Final  . Glucose-Capillary 06/30/2014 93  70 - 99 mg/dL Final  . Comment 1 64/33/2951 Notify RN   Final  . WBC 07/01/2014 6.7  4.0 - 10.5 K/uL Final  . RBC 07/01/2014 3.04* 3.87 - 5.11 MIL/uL Final  . Hemoglobin 07/01/2014 8.9* 12.0 - 15.0 g/dL Final  . HCT 88/41/6606 26.1* 36.0 - 46.0 % Final  . MCV 07/01/2014 85.9  78.0 - 100.0 fL Final  . MCH 07/01/2014 29.3  26.0 - 34.0 pg Final  . MCHC 07/01/2014 34.1  30.0 - 36.0 g/dL Final  . RDW 30/16/0109 14.5  11.5 - 15.5 % Final  . Platelets 07/01/2014 191  150 - 400 K/uL Final  . Sodium 07/01/2014 141  135 - 145 mmol/L Final  . Potassium 07/01/2014 3.8  3.5 - 5.1 mmol/L Final  . Chloride 07/01/2014 114* 96 - 112 mmol/L Final  . CO2 07/01/2014 23  19 - 32 mmol/L Final  . Glucose, Bld 07/01/2014 78  70 - 99 mg/dL Final  . BUN 32/35/5732 68* 6 - 23 mg/dL Final  . Creatinine, Ser 07/01/2014 1.73* 0.50 - 1.10 mg/dL Final  . Calcium 20/25/4270 8.6  8.4 - 10.5 mg/dL Final  . GFR calc non Af Amer 07/01/2014 24* >90 mL/min Final  . GFR calc Af Amer 07/01/2014 28* >90 mL/min Final   Comment: (NOTE) The eGFR has been calculated using the CKD EPI equation. This calculation has not been validated in all clinical situations.  eGFR's  persistently <90 mL/min signify possible Chronic Kidney Disease.   . Anion gap 07/01/2014 4* 5 - 15 Final  . Glucose-Capillary 06/30/2014 84  70 - 99 mg/dL Final  . Glucose-Capillary 06/30/2014 105* 70 - 99 mg/dL Final  . C difficile by pcr 07/01/2014 POSITIVE* NEGATIVE Final   Comment: CRITICAL RESULT CALLED TO, READ BACK BY AND VERIFIED WITH: T.SPENCER,RN 07/01/14 0817 BY BSLADE   . Glucose-Capillary 07/01/2014 81  70 - 99 mg/dL Final  . Glucose-Capillary 07/01/2014 86  70 - 99 mg/dL Final  . Comment 1 78/29/5621 Notify RN   Final  . Glucose-Capillary 07/01/2014 99  70 - 99 mg/dL Final  . Comment 1 30/86/5784 Notify RN   Final  . Sodium 07/02/2014 145  135 - 145 mmol/L Final  . Potassium 07/02/2014 3.7  3.5 - 5.1 mmol/L Final  . Chloride 07/02/2014 119* 96 - 112 mmol/L Final  . CO2 07/02/2014 18* 19 - 32 mmol/L Final  . Glucose, Bld 07/02/2014 86  70 - 99 mg/dL Final  . BUN 69/62/9528 46* 6 - 23 mg/dL Final  . Creatinine, Ser 07/02/2014 1.33* 0.50 - 1.10 mg/dL Final  . Calcium 41/32/4401 8.5  8.4 - 10.5 mg/dL Final  . GFR calc non Af Amer 07/02/2014 34* >90 mL/min Final  . GFR calc Af Amer 07/02/2014 39* >90 mL/min Final   Comment: (NOTE) The eGFR has been calculated using the CKD EPI equation. This calculation has not been validated in all clinical situations. eGFR's persistently <90 mL/min signify possible Chronic Kidney Disease.   . Anion gap 07/02/2014 8  5 - 15 Final  . Glucose-Capillary 07/01/2014 107* 70 - 99 mg/dL Final  . Comment 1 02/72/5366 Documented in Chart   Final  . Comment 2 07/01/2014 Notify RN   Final  . Glucose-Capillary 07/02/2014 79  70 - 99 mg/dL Final  . Glucose-Capillary 07/02/2014 85  70 - 99 mg/dL Final  . Comment 1 44/07/4740 Documented in Chart   Final  . Comment 2 07/02/2014 Notify RN   Final  . Glucose-Capillary 07/02/2014 94  70 - 99 mg/dL Final  . Squamous Epithelial / LPF 07/02/2014 RARE  RARE Final  . WBC, UA 07/02/2014 TOO NUMEROUS TO COUNT   <3 WBC/hpf Final  . RBC / HPF 07/02/2014 7-10  <3 RBC/hpf Final  . Bacteria, UA 07/02/2014 FEW* RARE Final  . Urine-Other 07/02/2014 AMORPHOUS URATES/PHOSPHATES   Final  . Glucose-Capillary 07/02/2014 97  70 - 99 mg/dL Final  . Sodium 59/56/3875 147* 135 - 145 mmol/L Final  . Potassium 07/03/2014 3.8  3.5 - 5.1 mmol/L Final  . Chloride 07/03/2014 120* 96 - 112 mmol/L Final  . CO2 07/03/2014 23  19 - 32 mmol/L Final  . Glucose, Bld 07/03/2014 81  70 - 99 mg/dL Final  . BUN 64/33/2951 29* 6 - 23 mg/dL Final  . Creatinine, Ser 07/03/2014 1.14* 0.50 - 1.10 mg/dL Final  . Calcium 88/41/6606 8.6  8.4 - 10.5 mg/dL Final  . GFR calc non Af Amer 07/03/2014 40* >90 mL/min Final  . GFR calc Af Amer 07/03/2014 47* >90 mL/min Final   Comment: (NOTE) The eGFR has been calculated using the CKD EPI equation. This calculation has not been validated in all clinical situations. eGFR's persistently <90 mL/min signify possible Chronic Kidney Disease.   . Anion gap 07/03/2014 4* 5 - 15 Final  . WBC 07/03/2014 8.8  4.0 - 10.5 K/uL Final  . RBC 07/03/2014 2.78* 3.87 - 5.11 MIL/uL  Final  . Hemoglobin 07/03/2014 8.0* 12.0 - 15.0 g/dL Final  . HCT 16/02/9603 24.0* 36.0 - 46.0 % Final  . MCV 07/03/2014 86.3  78.0 - 100.0 fL Final  . MCH 07/03/2014 28.8  26.0 - 34.0 pg Final  . MCHC 07/03/2014 33.3  30.0 - 36.0 g/dL Final  . RDW 54/01/8118 15.0  11.5 - 15.5 % Final  . Platelets 07/03/2014 161  150 - 400 K/uL Final  . Glucose-Capillary 07/02/2014 103* 70 - 99 mg/dL Final  . Glucose-Capillary 07/02/2014 90  70 - 99 mg/dL Final  . Comment 1 14/78/2956 Notify RN   Final  . Comment 2 07/02/2014 Documented in Chart   Final  . Glucose-Capillary 07/03/2014 80  70 - 99 mg/dL Final  . Glucose-Capillary 07/03/2014 94  70 - 99 mg/dL Final  . Comment 1 21/30/8657 Notify RN   Final    No results found.   Assessment/Plan    ICD-9-CM ICD-10-CM   1. Infected ulcer of skin, with unspecified severity 707.9 L98.499  doxycycline (VIBRAMYCIN) 100 MG capsule   686.9 L08.9 Anaerobic and Aerobic Culture   left heel  2. Knee effusion, right 719.06 M25.461 Ambulatory referral to Orthopedic Surgery  3. Mixed Alzheimer's and vascular dementia - cont namenda and aricept 331.0 G30.9    294.10 F01.50    290.40 F02.80   4. Failure to thrive in adult - encourage po intake 783.7 R62.7   5. Essential hypertension, benign - stable; cont meds 401.1 I10   6. Slow transit constipation - stable 564.01 K59.01   7. Chronic combined systolic and diastolic heart failure - stable; cont meds 428.42 I50.42   8. Spinal stenosis of lumbar region - stable 724.02 M48.06      --Continue wound care as indicated. T/c imaging if no improvement at next OV or it worsens per wound care  --Start PT/OT as ordered  --Continue other medications as ordered  --Will call with culture results.  --Will call with Ortho referral  --Finish antibiotic + probiotic   --Follow up in 2 weeks to reassess wound   Dalon Reichart S. Ancil Linsey  [facility] [address] [place], [place] [address] [phone] Cell (Monday-Friday 8 AM - 5 PM) [phone] After 5 PM and follow prompts

## 2014-10-16 NOTE — Patient Instructions (Addendum)
Continue wound care as indicated  Start PT/OT as ordered  Continue other medications as ordered  Will call with culture results.  Will call with Ortho referral  Finish antibiotic + probiotic   Follow up in 2 weeks to reassess wound

## 2014-10-20 LAB — ANAEROBIC AND AEROBIC CULTURE

## 2014-10-22 ENCOUNTER — Telehealth: Payer: Self-pay | Admitting: *Deleted

## 2014-10-22 NOTE — Telephone Encounter (Signed)
[name] with [facility] called and wanted verbal orders for OT for patient. Verbal orders Given.

## 2014-10-27 ENCOUNTER — Telehealth: Payer: Self-pay | Admitting: *Deleted

## 2014-10-27 NOTE — Telephone Encounter (Signed)
Mallory with [facility] called wanting verbal orders for Child psychotherapistocial Worker. Given

## 2014-11-15 DIAGNOSIS — L89629 Pressure ulcer of left heel, unspecified stage: Secondary | ICD-10-CM | POA: Diagnosis not present

## 2014-11-15 DIAGNOSIS — I5042 Chronic combined systolic (congestive) and diastolic (congestive) heart failure: Secondary | ICD-10-CM

## 2014-11-15 DIAGNOSIS — M6281 Muscle weakness (generalized): Secondary | ICD-10-CM | POA: Diagnosis not present

## 2014-11-15 DIAGNOSIS — F0390 Unspecified dementia without behavioral disturbance: Secondary | ICD-10-CM | POA: Diagnosis not present

## 2014-11-15 DIAGNOSIS — N182 Chronic kidney disease, stage 2 (mild): Secondary | ICD-10-CM | POA: Diagnosis not present

## 2014-11-25 ENCOUNTER — Ambulatory Visit: Payer: Medicare Other | Admitting: Internal Medicine

## 2014-12-08 ENCOUNTER — Other Ambulatory Visit: Payer: Self-pay | Admitting: Internal Medicine

## 2014-12-15 ENCOUNTER — Telehealth: Payer: Self-pay

## 2014-12-15 DIAGNOSIS — F0150 Vascular dementia without behavioral disturbance: Secondary | ICD-10-CM

## 2014-12-15 DIAGNOSIS — G309 Alzheimer's disease, unspecified: Secondary | ICD-10-CM

## 2014-12-15 DIAGNOSIS — R3981 Functional urinary incontinence: Secondary | ICD-10-CM

## 2014-12-15 DIAGNOSIS — J439 Emphysema, unspecified: Secondary | ICD-10-CM

## 2014-12-15 DIAGNOSIS — F0280 Dementia in other diseases classified elsewhere without behavioral disturbance: Secondary | ICD-10-CM

## 2014-12-15 DIAGNOSIS — R269 Unspecified abnormalities of gait and mobility: Secondary | ICD-10-CM

## 2014-12-15 MED ORDER — COMMODE BEDSIDE MISC: Status: DC

## 2014-12-15 NOTE — Telephone Encounter (Signed)
[name] from [facility] called indicating patient needs an order for a drop arm bedside commode. Please fax order to [phone]  Please insert diagnosis on patient signature for pending rx. I will print in office, stamp, and fax.

## 2014-12-15 NOTE — Telephone Encounter (Signed)
DONE

## 2014-12-16 ENCOUNTER — Other Ambulatory Visit: Payer: Self-pay | Admitting: Internal Medicine

## 2014-12-17 MED ORDER — COMMODE BEDSIDE MISC: Status: DC

## 2014-12-17 NOTE — Telephone Encounter (Signed)
Order faxed.

## 2014-12-25 ENCOUNTER — Encounter: Payer: Self-pay | Admitting: Internal Medicine

## 2015-01-08 ENCOUNTER — Telehealth: Payer: Self-pay | Admitting: Internal Medicine

## 2015-01-08 NOTE — Telephone Encounter (Signed)
FYI - We have made multiple attempts to contact the patient for the orthopedics referral, as well as the orthopedists office with no response from the patient.  A letter was also mailed to the patient with no response

## 2015-01-08 NOTE — Telephone Encounter (Signed)
Noted  

## 2015-03-25 ENCOUNTER — Other Ambulatory Visit: Payer: Self-pay | Admitting: Internal Medicine

## 2015-06-16 ENCOUNTER — Other Ambulatory Visit: Payer: Self-pay | Admitting: Internal Medicine

## 2015-07-17 IMAGING — CR DG CHEST 2V
2 series · 2 of 2 positions shown · non-contrast
Comparison: 04/25/2013 and earlier.

CLINICAL DATA: [AGE] female with shortness of breath,
pulmonary fibrosis, cough. Initial encounter.

EXAM:
CHEST  2 VIEW

[w chest pa]
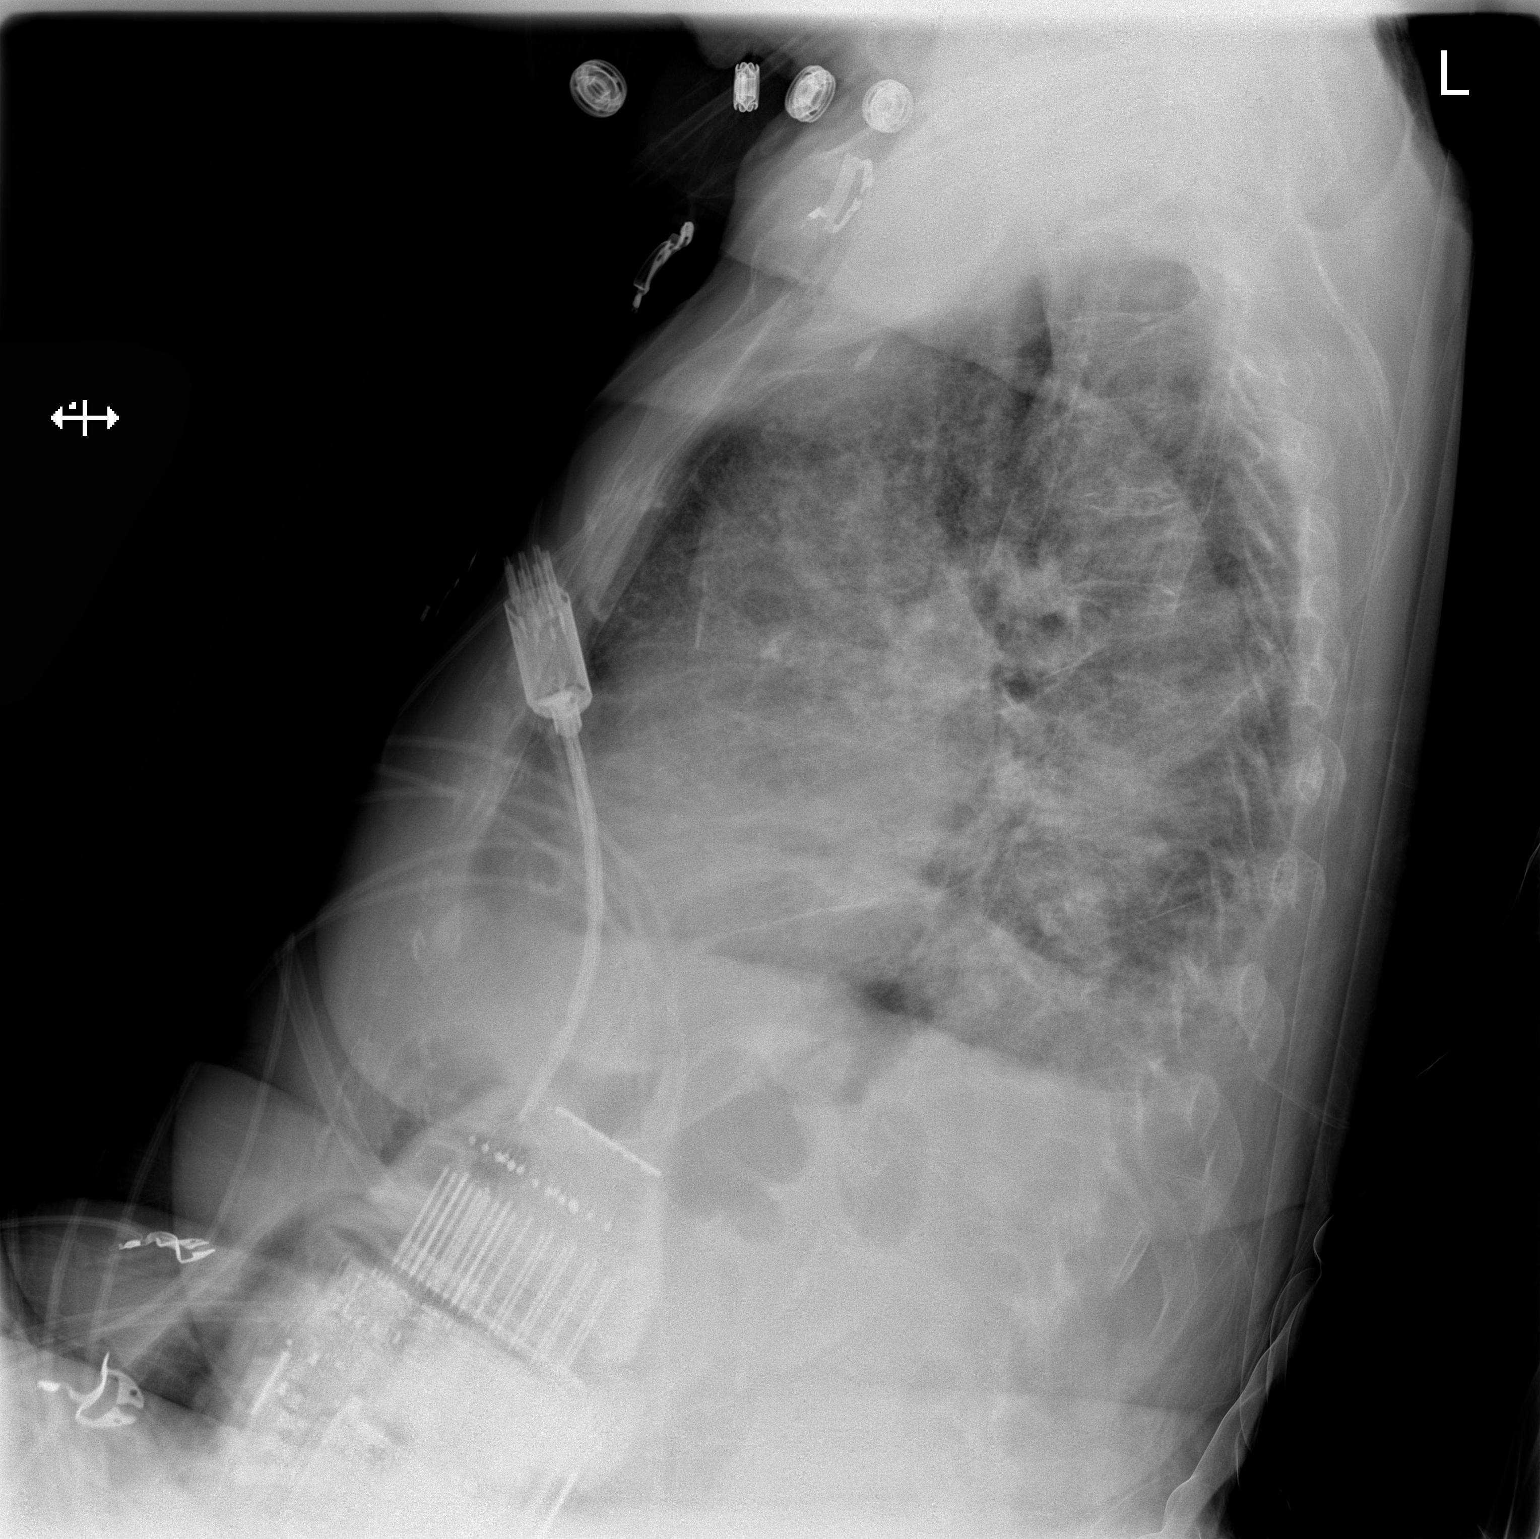

[x chest ap]
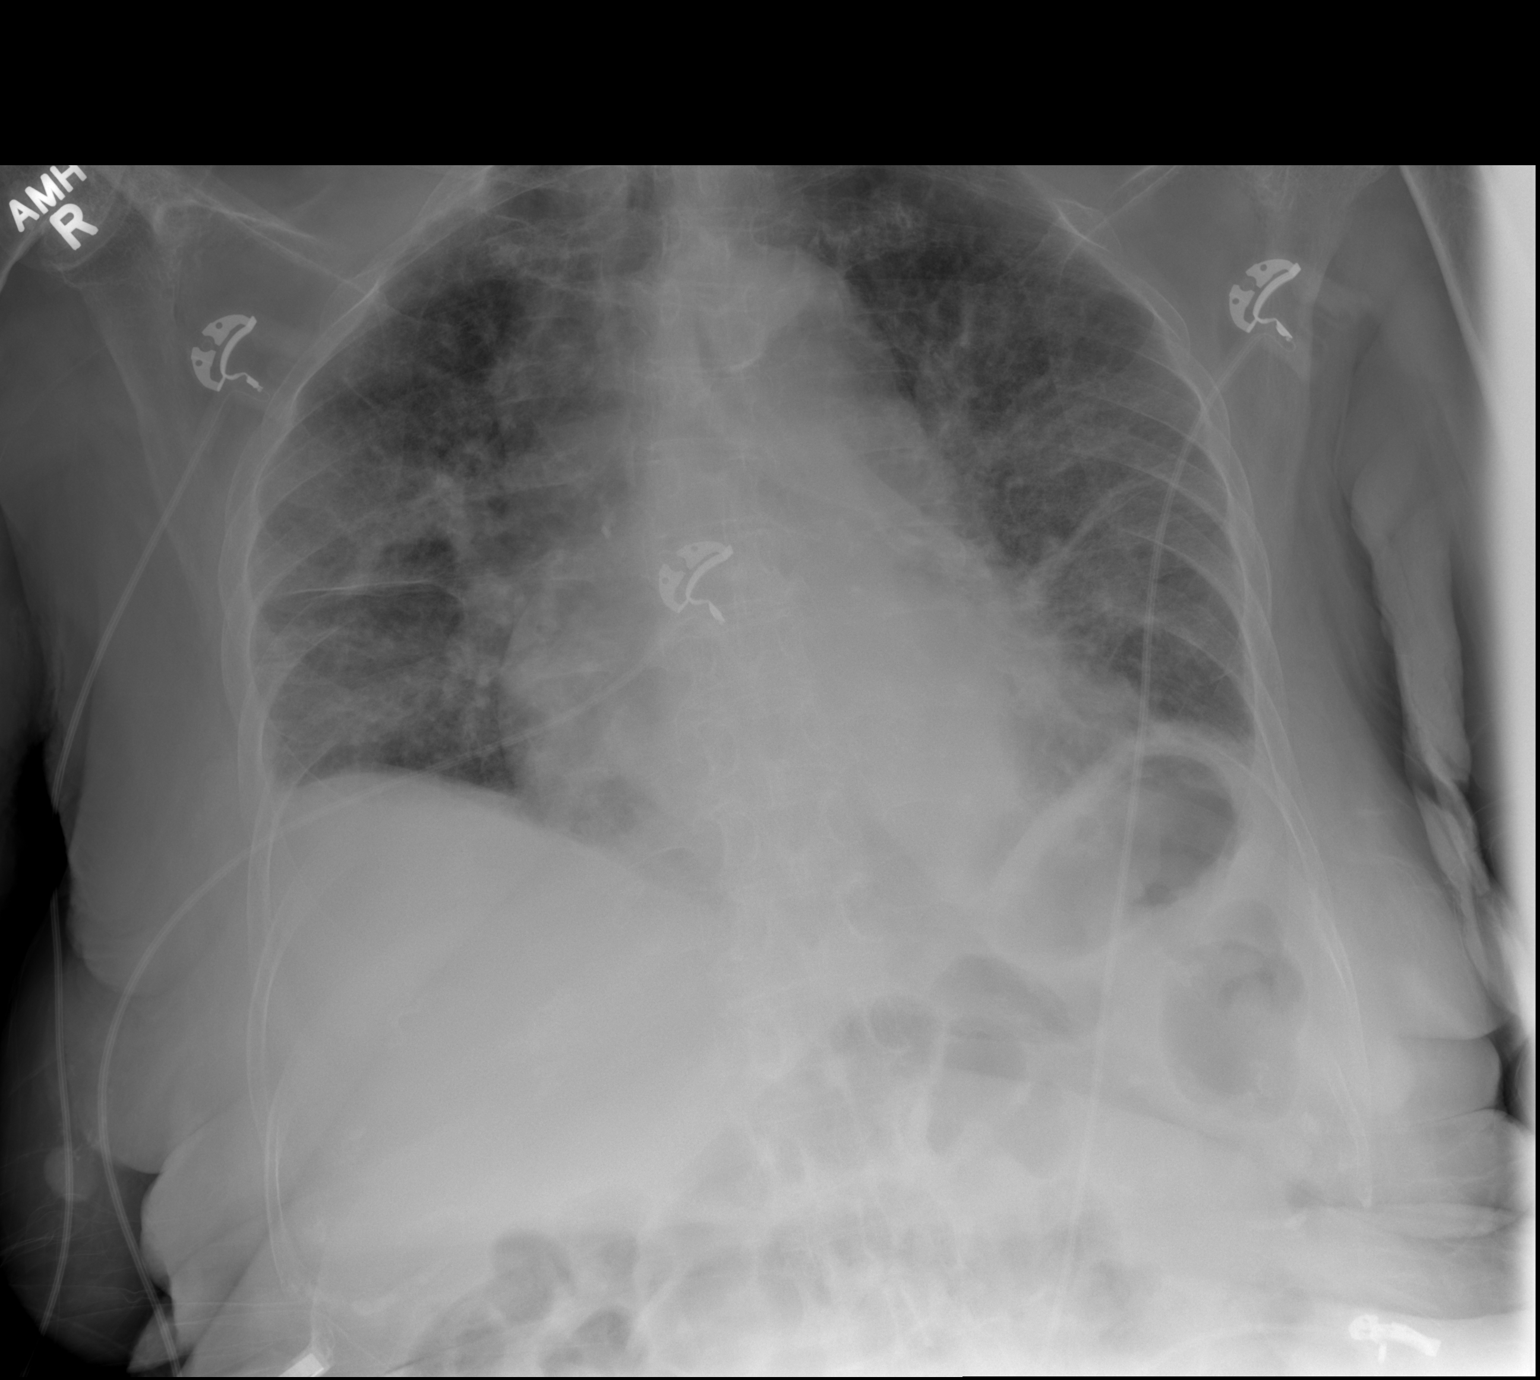

[2 of 2 positions shown; findings below may reference images not displayed]

FINDINGS: Semi upright AP and lateral views of the chest. Stable cardiomegaly
and mediastinal contours. Hazy increased bilateral perihilar opacity
greater than that at baseline, although Lung volumes are lower. No
pneumothorax. Small pleural effusions suspected. No definite
consolidation. Osteopenia.
IMPRESSION: Lower lung volumes with hazy bilateral perihilar opacity, which
could reflect atelectasis, but interstitial edema or atypical
infection not excluded. Small pleural effusions. Chronic
cardiomegaly.

## 2015-09-22 ENCOUNTER — Other Ambulatory Visit: Payer: Self-pay | Admitting: Internal Medicine

## 2015-10-13 ENCOUNTER — Other Ambulatory Visit: Payer: Self-pay | Admitting: Internal Medicine

## 2016-06-21 ENCOUNTER — Ambulatory Visit (INDEPENDENT_AMBULATORY_CARE_PROVIDER_SITE_OTHER): Payer: Medicare Other | Admitting: Internal Medicine

## 2016-06-21 ENCOUNTER — Encounter: Payer: Self-pay | Admitting: Internal Medicine

## 2016-06-21 VITALS — BP 142/96 | HR 62 | Temp 97.4°F | Ht 60.0 in

## 2016-06-21 DIAGNOSIS — J439 Emphysema, unspecified: Secondary | ICD-10-CM | POA: Diagnosis not present

## 2016-06-21 DIAGNOSIS — R269 Unspecified abnormalities of gait and mobility: Secondary | ICD-10-CM | POA: Diagnosis not present

## 2016-06-21 DIAGNOSIS — G309 Alzheimer's disease, unspecified: Secondary | ICD-10-CM

## 2016-06-21 DIAGNOSIS — R32 Unspecified urinary incontinence: Secondary | ICD-10-CM | POA: Diagnosis not present

## 2016-06-21 DIAGNOSIS — I10 Essential (primary) hypertension: Secondary | ICD-10-CM | POA: Diagnosis not present

## 2016-06-21 DIAGNOSIS — F0280 Dementia in other diseases classified elsewhere without behavioral disturbance: Secondary | ICD-10-CM

## 2016-06-21 DIAGNOSIS — B359 Dermatophytosis, unspecified: Secondary | ICD-10-CM | POA: Diagnosis not present

## 2016-06-21 DIAGNOSIS — I5042 Chronic combined systolic (congestive) and diastolic (congestive) heart failure: Secondary | ICD-10-CM | POA: Diagnosis not present

## 2016-06-21 DIAGNOSIS — F0150 Vascular dementia without behavioral disturbance: Secondary | ICD-10-CM | POA: Diagnosis not present

## 2016-06-21 LAB — CBC WITH DIFFERENTIAL/PLATELET
BASOS ABS: 0 {cells}/uL (ref 0–200)
Basophils Relative: 0 %
EOS PCT: 0 %
Eosinophils Absolute: 0 cells/uL — ABNORMAL LOW (ref 15–500)
HCT: 30.9 % — ABNORMAL LOW (ref 35.0–45.0)
Hemoglobin: 10.1 g/dL — ABNORMAL LOW (ref 11.7–15.5)
Lymphocytes Relative: 24 %
Lymphs Abs: 1704 cells/uL (ref 850–3900)
MCH: 28.9 pg (ref 27.0–33.0)
MCHC: 32.7 g/dL (ref 32.0–36.0)
MCV: 88.5 fL (ref 80.0–100.0)
MONOS PCT: 10 %
MPV: 9.7 fL (ref 7.5–12.5)
Monocytes Absolute: 710 cells/uL (ref 200–950)
NEUTROS ABS: 4686 {cells}/uL (ref 1500–7800)
Neutrophils Relative %: 66 %
PLATELETS: 196 10*3/uL (ref 140–400)
RBC: 3.49 MIL/uL — ABNORMAL LOW (ref 3.80–5.10)
RDW: 14.9 % (ref 11.0–15.0)
WBC: 7.1 10*3/uL (ref 3.8–10.8)

## 2016-06-21 LAB — LIPID PANEL
CHOL/HDL RATIO: 3 ratio (ref <5.0)
CHOLESTEROL: 125 mg/dL (ref <200)
HDL: 42 mg/dL — ABNORMAL LOW (ref >50)
LDL Cholesterol: 69 mg/dL (ref <100)
TRIGLYCERIDES: 72 mg/dL (ref <150)
VLDL: 14 mg/dL (ref <30)

## 2016-06-21 LAB — COMPLETE METABOLIC PANEL WITH GFR
ALT: 5 U/L — ABNORMAL LOW (ref 6–29)
AST: 14 U/L (ref 10–35)
Albumin: 3 g/dL — ABNORMAL LOW (ref 3.6–5.1)
Alkaline Phosphatase: 53 U/L (ref 33–130)
BUN: 18 mg/dL (ref 7–25)
CO2: 24 mmol/L (ref 20–31)
Calcium: 8.9 mg/dL (ref 8.6–10.4)
Chloride: 111 mmol/L — ABNORMAL HIGH (ref 98–110)
Creat: 0.85 mg/dL (ref 0.60–0.88)
GFR, EST NON AFRICAN AMERICAN: 59 mL/min — AB (ref >=60)
GFR, Est African American: 68 mL/min (ref >=60)
GLUCOSE: 78 mg/dL (ref 65–99)
POTASSIUM: 4.2 mmol/L (ref 3.5–5.3)
SODIUM: 144 mmol/L (ref 135–146)
Total Bilirubin: 0.6 mg/dL (ref 0.2–1.2)
Total Protein: 6.7 g/dL (ref 6.1–8.1)

## 2016-06-21 LAB — TSH: TSH: 1.51 mIU/L

## 2016-06-21 MED ORDER — METOPROLOL TARTRATE 25 MG PO TABS: 12.5000 mg | ORAL_TABLET | Freq: Every day | ORAL | 3 refills | Status: DC

## 2016-06-21 MED ORDER — FUROSEMIDE 20 MG PO TABS: 20.0000 mg | ORAL_TABLET | Freq: Every day | ORAL | 6 refills | Status: DC

## 2016-06-21 MED ORDER — MEMANTINE HCL ER 28 MG PO CP24: 28.0000 mg | ORAL_CAPSULE | Freq: Every day | ORAL | 6 refills | Status: DC

## 2016-06-21 MED ORDER — DONEPEZIL HCL 10 MG PO TABS: 10.0000 mg | ORAL_TABLET | Freq: Every day | ORAL | 6 refills | Status: DC

## 2016-06-21 MED ORDER — ISOSORBIDE MONONITRATE ER 30 MG PO TB24: 30.0000 mg | ORAL_TABLET | Freq: Every day | ORAL | 6 refills | Status: DC

## 2016-06-21 MED ORDER — SOLIFENACIN SUCCINATE 5 MG PO TABS: 5.0000 mg | ORAL_TABLET | Freq: Every day | ORAL | 6 refills | Status: DC

## 2016-06-21 MED ORDER — CLOPIDOGREL BISULFATE 75 MG PO TABS: 75.0000 mg | ORAL_TABLET | Freq: Every day | ORAL | 6 refills | Status: DC

## 2016-06-21 MED ORDER — MEMANTINE HCL 28 X 5 MG & 21 X 10 MG PO TABS: ORAL_TABLET | ORAL | 0 refills | Status: DC

## 2016-06-21 MED ORDER — MEMANTINE HCL ER 7 & 14 & 21 &28 MG PO CP24: 1.0000 | ORAL_CAPSULE | Freq: Every day | ORAL | 0 refills | Status: DC

## 2016-06-21 MED ORDER — NYSTATIN 100000 UNIT/GM EX POWD: Freq: Three times a day (TID) | CUTANEOUS | 2 refills | Status: DC

## 2016-06-21 MED ORDER — ALBUTEROL SULFATE (2.5 MG/3ML) 0.083% IN NEBU: 2.5000 mg | INHALATION_SOLUTION | Freq: Four times a day (QID) | RESPIRATORY_TRACT | 3 refills | Status: DC | PRN

## 2016-06-21 NOTE — Patient Instructions (Signed)
Reduce lopressor to 1/2 tab daily for heart and blood pressure  Continue other medications as ordered  Keep area on both breasts dry  Home health will contact you about referral  Await placement in SNF/long term care for follow up instructions  Will call with lab results

## 2016-06-21 NOTE — Progress Notes (Signed)
Patient ID: [name], female   DOB: 1920-02-25, 83 y.o.   MRN: [id number]    Location:  [facility] Place of Service: OFFICE  Chief Complaint  Patient presents with  . Medical Management of Chronic Issues    medication management COPD, CHF, blood pressure. Last seen 10/16/14.     HPI:  83 yo female seen today for f/u. Daughter finds caring for mother challenging due to her work schedule. She is interested in long term care placement.   Pt reports no CP, SOB. Appetite ok. Sleeps well but does talk in her sleep. She has occasional auditory and visual hallucinations. No behavioral disturbances. She is incontinent of urine and stool. She feeds herself. She needs assistance with meal prep, grooming, bathing. She is immobile and requires total assistance with transfers and most ADLs. Daughter uses w/c at home. She was transported on stretcher by EMS today. She has been out-of-meds since Dec 2017.  Daughter also reports rash under pt's left breast x several weeks. She has applied baby cornstarch and a cream. No d/c. She states she does sweat under her breasts.  COPD - had an episode of severe wheezing and EMS called to home. She was given a nebulizer which helped but not taken to the ED. Occasional wheezing but no further exacerbations. Last CT chest wo contrast in 2014 revealed multiple ground glass densities s/o interstitial fibrotic change; CXR in 2016 showed interstitial markings had improved but still present s/o chronic process.  Hx CHF/HTN/CAD - takes imdur, lasix and plavix  Dementia, Alzheimer's and vascular - on namenda and aricept  Urinary incontinence - takes vesicare  Past Medical History:  Diagnosis Date  . CHF (congestive heart failure) ([place])   . Constipation   . COPD (chronic obstructive pulmonary disease) ([place])   . Dementia   . Frequency of urination   . Hypertension   . Incontinence   . Pneumonia   . Spinal stenosis     Past Surgical History:  Procedure Laterality  Date  . COLONOSCOPY  1991    Patient Care Team: Gildardo Cranker, DO as PCP - General (Internal Medicine)  Social History   Social History  . Marital status: Widowed    Spouse name: N/A  . Number of children: N/A  . Years of education: N/A   Occupational History  . Not on file.   Social History Main Topics  . Smoking status: Former Smoker    Quit date: 09/27/1978  . Smokeless tobacco: Never Used  . Alcohol use No  . Drug use: No  . Sexual activity: Not on file   Other Topics Concern  . Not on file   Social History Narrative  . No narrative on file     reports that she quit smoking about 37 years ago. She has never used smokeless tobacco. She reports that she does not drink alcohol or use drugs.  Family History  Problem Relation Age of Onset  . Cancer Brother   . Cancer Son    Family Status  Relation Status  . Mother Deceased  . Father Deceased  . Sister Alive  . Brother Deceased  . Daughter Alive  . Son Deceased  . Brother Deceased  . Son Alive     No Known Allergies  Medications: Patient's Medications  New Prescriptions   No medications on file  Previous Medications   CHOLECALCIFEROL (VITAMIN D) 1000 UNITS TABLET    TAKE ONE CAPSULE EVERY DAY   CLOPIDOGREL (PLAVIX) 75 MG TABLET  APPOINTMENT OVERDUE 1 by mouth daily   DONEPEZIL (ARICEPT) 10 MG TABLET    TAKE 1 TABLET EVERY DAY   FUROSEMIDE (LASIX) 20 MG TABLET    TAKE 1 TABLET BY MOUTH EVERY DAY (NEED APPT FOR MORE REFILLS!!)   ISOSORBIDE MONONITRATE (IMDUR) 30 MG 24 HR TABLET    TAKE 1 TABLET BY MOUTH EVERY DAY (NEED APPT FOR MORE REFILLS!!)   MISC. DEVICES (COMMODE BEDSIDE) MISC    Drop arm bedside commode   NAMENDA XR 28 MG CP24 24 HR CAPSULE    TAKE ONE CAPSULE EVERY DAY   VESICARE 5 MG TABLET    TAKE 1 TABLET EVERY DAY  Modified Medications   No medications on file  Discontinued Medications   DOXYCYCLINE (VIBRAMYCIN) 100 MG CAPSULE    Take 1 capsule (100 mg total) by mouth 2 (two) times daily.     METOPROLOL (LOPRESSOR) 25 MG TABLET    Take 1 tablet (25 mg total) by mouth 2 (two) times daily.   METOPROLOL TARTRATE (LOPRESSOR) 25 MG TABLET    TAKE 1 TABLET TWICE A DAY   SACCHAROMYCES BOULARDII (FLORASTOR) 250 MG CAPSULE    Take 1 capsule (250 mg total) by mouth 2 (two) times daily.    Review of Systems  Unable to perform ROS: Dementia    Vitals:   06/21/16 0927  BP: (!) 142/96  Pulse: 62  Temp: 97.4 F (36.3 C)  TempSrc: Oral  SpO2: 92%  Height: 5' (1.524 m)   There is no height or weight on file to calculate BMI.  Physical Exam  Constitutional: She appears well-developed.  Lying on stretcher. Daughter present along with EMTs, frail appearing in NAD  HENT:  Mouth/Throat: Oropharynx is clear and moist. No oropharyngeal exudate.  Eyes: Pupils are equal, round, and reactive to light. No scleral icterus.  Neck: Neck supple. No tracheal deviation present. No thyromegaly present.  Cardiovascular: Normal rate, regular rhythm and intact distal pulses.   Occasional extrasystoles are present. Exam reveals decreased pulses. Exam reveals no gallop and no friction rub.   Murmur (1/6 SEM) heard. Pulses:      Dorsalis pedis pulses are 1+ on the right side, and 1+ on the left side.       Posterior tibial pulses are 0 on the right side, and 1+ on the left side.  No LE edema b/l. no calf TTP. No carotid bruit b/l  Pulmonary/Chest: Effort normal and breath sounds normal. No stridor. No respiratory distress. She has no wheezes. She has no rales.  Abdominal: Soft. Bowel sounds are normal. She exhibits no distension and no mass. There is no tenderness. There is no rebound and no guarding.  Musculoskeletal: She exhibits no edema or tenderness.       Right knee: She exhibits normal range of motion, no swelling, no effusion, no deformity, no erythema and no bony tenderness.       Left knee: She exhibits normal range of motion, no swelling, no effusion and no deformity.  Lymphadenopathy:     She has no cervical adenopathy.  Neurological: She is alert.  Skin: Skin is warm and dry. Rash (macerated tinea rash under left breast, moist) noted.     Psychiatric: She has a normal mood and affect. Her behavior is normal.     Labs reviewed: No visits with results within 3 Month(s) from this visit.  Latest known visit with results is:  Office Visit on 10/16/2014  Component Date Value Ref Range Status  .  Anaerobic Culture 10/16/2014 Final report   Final  . Result 1 10/16/2014 Comment   Final  . Aerobic Culture 10/16/2014 Final report*  Final  . Result 1 10/16/2014 Staphylococcus aureus*  Final   Comment: Heavy growth Methicillin resistant (MRSA) Based on resistance to oxacillin this isolate would be resistant to all currently available beta-lactam antimicrobial agents, with the exception of the newer cephalosporins with anti-MRSA activity, such as Ceftaroline   . Result 2 10/16/2014 Mixed skin flora   Final  . Antimicrobial Susceptibility 10/16/2014 Comment   Final   Comment:       ** S = Susceptible; I = Intermediate; R = Resistant **                    P = Positive; N = Negative             MICS are expressed in micrograms per mL    Antibiotic                 RSLT#1    RSLT#2    RSLT#3    RSLT#4 Ciprofloxacin                  R Clindamycin                    S Erythromycin                   S Gentamicin                     S Levofloxacin                   R Oxacillin                      R Penicillin                     R Rifampin                       S Tetracycline                   S Trimethoprim/Sulfa             S Vancomycin                     S     No results found.   Assessment/Plan   ICD-9-CM ICD-10-CM   1. Essential hypertension, benign 401.1 I10 metoprolol tartrate (LOPRESSOR) 25 MG tablet     clopidogrel (PLAVIX) 75 MG tablet     CBC with Differential/Platelets     TSH  2. Mixed Alzheimer's and vascular dementia 331.0 G30.9 memantine (NAMENDA XR)  28 MG CP24 24 hr capsule   294.10 F01.50 donepezil (ARICEPT) 10 MG tablet   290.40 F02.80 Ambulatory referral to Home Health     Memantine HCl ER (NAMENDA XR TITRATION PACK) 7 & 14 & 21 &28 MG CP24     DISCONTINUED: memantine (NAMENDA TITRATION PAK) tablet pack  3. Chronic combined systolic and diastolic heart failure (HCC) 428.42 I50.42 metoprolol tartrate (LOPRESSOR) 25 MG tablet     isosorbide mononitrate (IMDUR) 30 MG 24 hr tablet     furosemide (LASIX) 20 MG tablet     clopidogrel (PLAVIX) 75 MG tablet     Ambulatory referral to [facility]     CBC with Differential/Platelets  CMP with eGFR     Lipid Panel     TSH  4. Abnormality of gait 781.2 R26.9   5. Pulmonary emphysema, unspecified emphysema type (HCC) 492.8 J43.9 albuterol (PROVENTIL) (2.5 MG/3ML) 0.083% nebulizer solution  6. Urinary incontinence, unspecified type 788.30 R32 solifenacin (VESICARE) 5 MG tablet  7. Tinea 110.9 B35.9 nystatin (MYCOSTATIN/NYSTOP) powder   Reduce lopressor to 1/2 tab daily for heart and blood pressure  Continue other medications as ordered  Keep area on both breasts dry  Home health will contact you about referral  Await placement in SNF/long term care for follow up instructions. If not completed in next 1-2 mos, she will need to f/u in 3-4 mos and will require transport to office by stretcher  Will call with lab results  TIME SPENT [place] >50% [place]. Perlie Gold  [facility] [address] [place], [place] [address] [phone] Cell (Monday-Friday 8 AM - 5 PM) [phone] After 5 PM and follow prompts

## 2016-06-28 ENCOUNTER — Telehealth: Payer: Self-pay

## 2016-06-28 DIAGNOSIS — I5042 Chronic combined systolic (congestive) and diastolic (congestive) heart failure: Secondary | ICD-10-CM | POA: Diagnosis not present

## 2016-06-28 DIAGNOSIS — G309 Alzheimer's disease, unspecified: Secondary | ICD-10-CM | POA: Diagnosis not present

## 2016-06-28 DIAGNOSIS — F0280 Dementia in other diseases classified elsewhere without behavioral disturbance: Secondary | ICD-10-CM | POA: Diagnosis not present

## 2016-06-28 DIAGNOSIS — I130 Hypertensive heart and chronic kidney disease with heart failure and stage 1 through stage 4 chronic kidney disease, or unspecified chronic kidney disease: Secondary | ICD-10-CM | POA: Diagnosis not present

## 2016-06-28 DIAGNOSIS — I2510 Atherosclerotic heart disease of native coronary artery without angina pectoris: Secondary | ICD-10-CM | POA: Diagnosis not present

## 2016-06-28 DIAGNOSIS — J439 Emphysema, unspecified: Secondary | ICD-10-CM | POA: Diagnosis not present

## 2016-06-28 DIAGNOSIS — N182 Chronic kidney disease, stage 2 (mild): Secondary | ICD-10-CM | POA: Diagnosis not present

## 2016-06-28 DIAGNOSIS — F329 Major depressive disorder, single episode, unspecified: Secondary | ICD-10-CM | POA: Diagnosis not present

## 2016-06-28 NOTE — Telephone Encounter (Signed)
[name] with [facility] called to inform Dr.Carter and staff that referral was received and patient will have her evaluation today.  FYI- no return call needed

## 2016-09-17 IMAGING — CR DG CHEST 2V
2 series · 2 of 2 positions shown · non-contrast
Comparison: Radiographs 04/28/2013.  Chest CT 05/13/2013

CLINICAL DATA: [AGE] female with shortness of breath for 2
days. Confusion.

EXAM:
CHEST  2 VIEW

[chest lat]
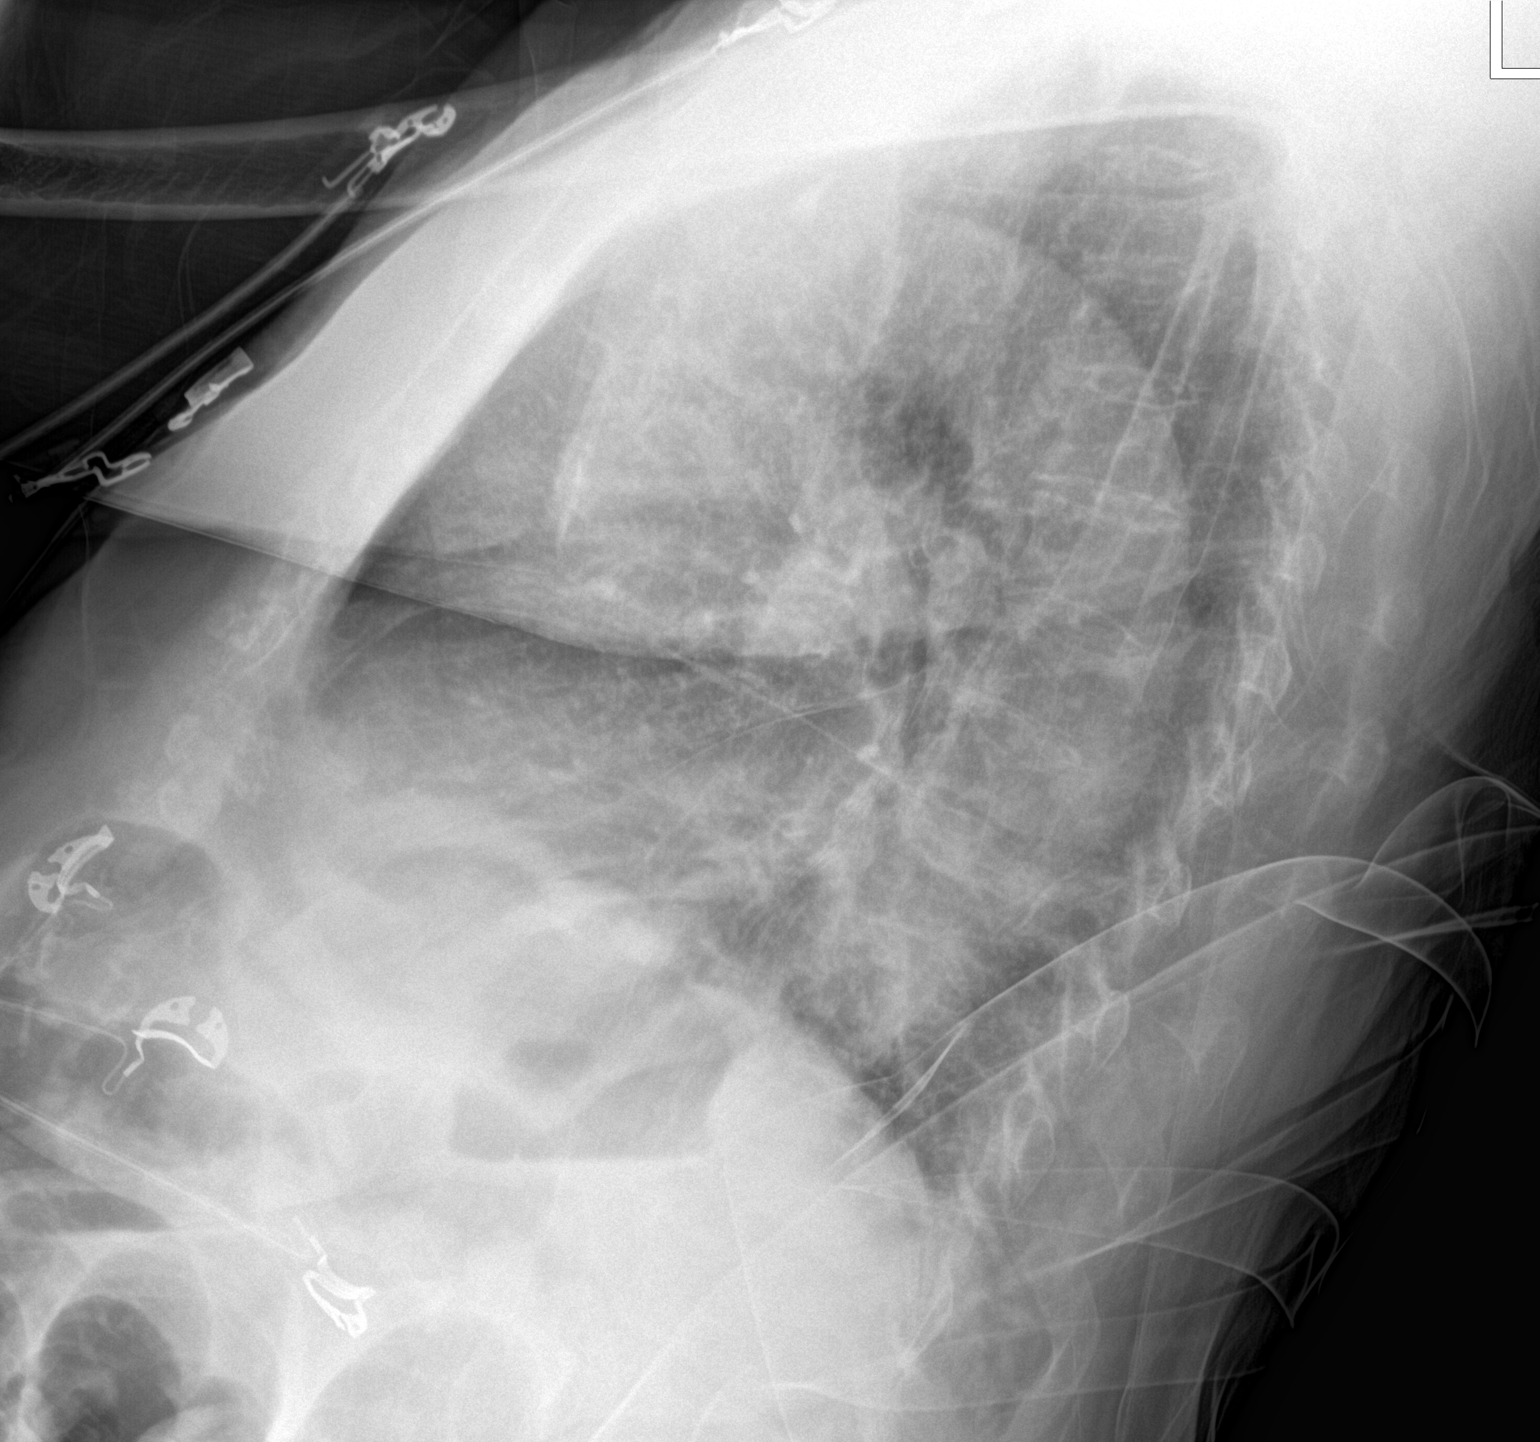

[chest ap]
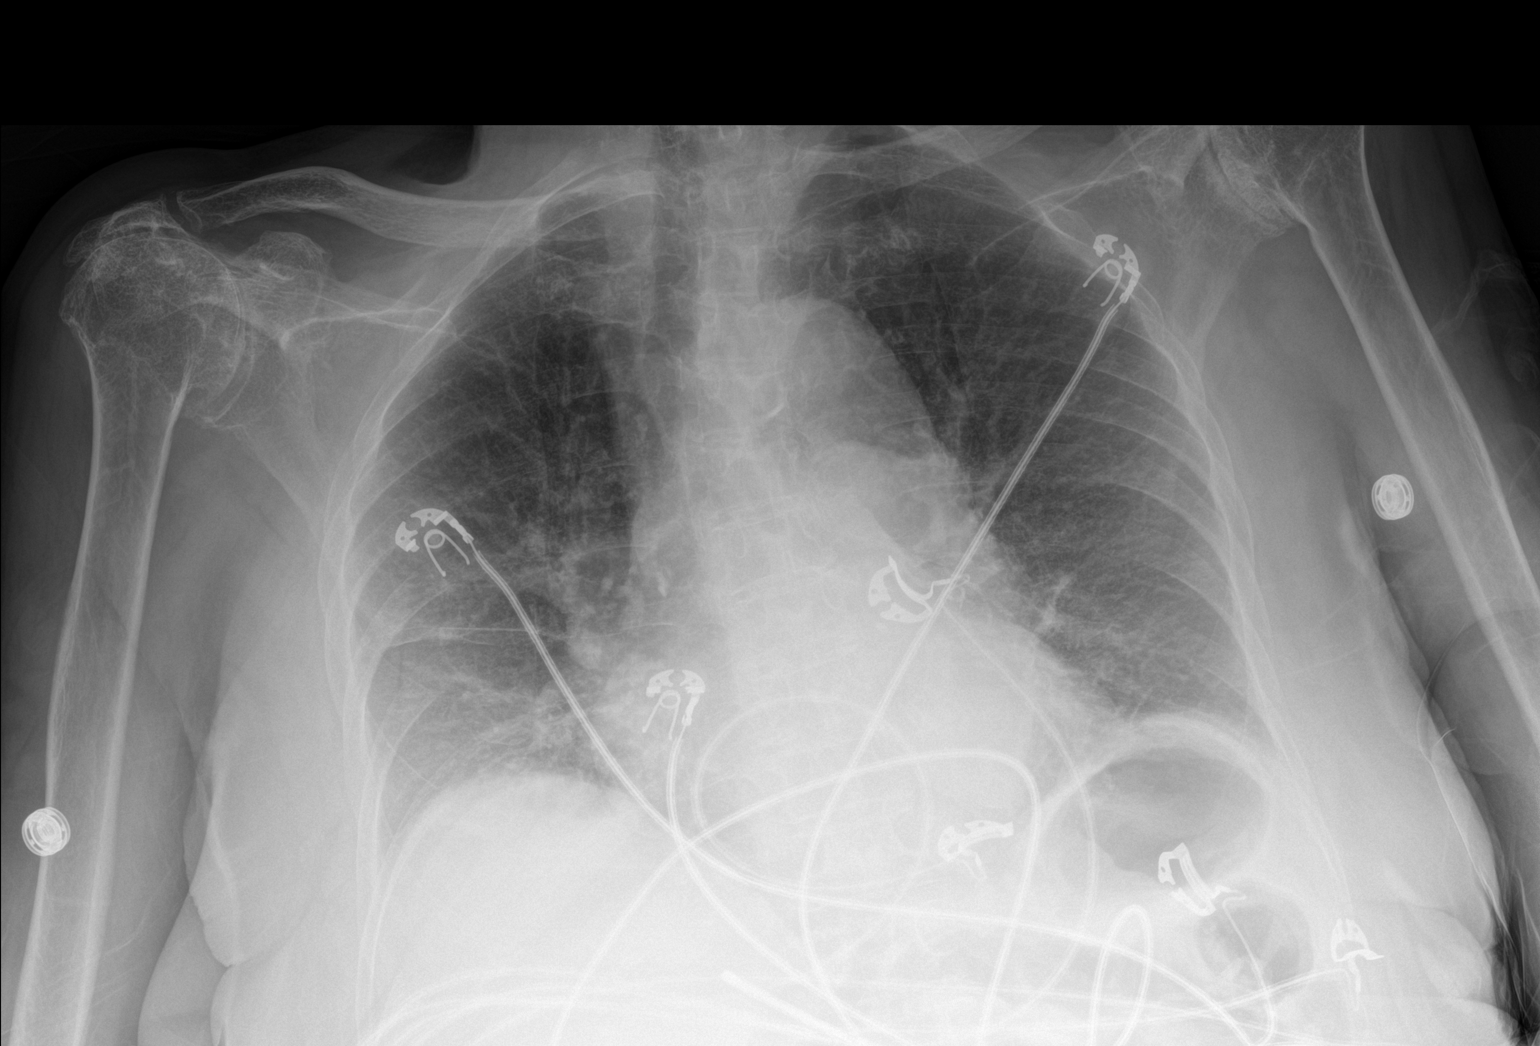

[2 of 2 positions shown; findings below may reference images not displayed]

FINDINGS: Mild cardiomegaly is unchanged, there is tortuosity of the thoracic
aorta. This is unchanged from prior exam. There coarse interstitial
markings, decreased interstitial opacities compared to prior.
Minimal atelectasis at the right lung base. No confluent airspace
disease to suggest pneumonia. There is no large pleural effusion or
pneumothorax. No pulmonary edema. Underlying osteopenia and severe
degenerative change of both shoulders, unchanged.
IMPRESSION: Minimal atelectasis at the right lung base. Prominent interstitial
markings are improved from prior exam and may be chronic.

## 2017-01-01 ENCOUNTER — Other Ambulatory Visit: Payer: Self-pay | Admitting: Internal Medicine

## 2017-01-01 DIAGNOSIS — R32 Unspecified urinary incontinence: Secondary | ICD-10-CM

## 2017-04-04 ENCOUNTER — Other Ambulatory Visit: Payer: Self-pay | Admitting: Internal Medicine

## 2017-04-04 DIAGNOSIS — I5042 Chronic combined systolic (congestive) and diastolic (congestive) heart failure: Secondary | ICD-10-CM

## 2017-04-04 DIAGNOSIS — G309 Alzheimer's disease, unspecified: Principal | ICD-10-CM

## 2017-04-04 DIAGNOSIS — I10 Essential (primary) hypertension: Secondary | ICD-10-CM

## 2017-04-04 DIAGNOSIS — F0280 Dementia in other diseases classified elsewhere without behavioral disturbance: Principal | ICD-10-CM

## 2017-04-04 DIAGNOSIS — F0150 Vascular dementia without behavioral disturbance: Secondary | ICD-10-CM

## 2017-08-10 ENCOUNTER — Ambulatory Visit (INDEPENDENT_AMBULATORY_CARE_PROVIDER_SITE_OTHER): Payer: Medicare Other | Admitting: Internal Medicine

## 2017-08-10 ENCOUNTER — Telehealth: Payer: Self-pay

## 2017-08-10 ENCOUNTER — Encounter: Payer: Self-pay | Admitting: Internal Medicine

## 2017-08-10 VITALS — BP 128/76 | HR 76 | Temp 98.1°F

## 2017-08-10 DIAGNOSIS — R4182 Altered mental status, unspecified: Secondary | ICD-10-CM

## 2017-08-10 DIAGNOSIS — F0280 Dementia in other diseases classified elsewhere without behavioral disturbance: Secondary | ICD-10-CM

## 2017-08-10 DIAGNOSIS — J439 Emphysema, unspecified: Secondary | ICD-10-CM | POA: Diagnosis not present

## 2017-08-10 DIAGNOSIS — R634 Abnormal weight loss: Secondary | ICD-10-CM | POA: Diagnosis not present

## 2017-08-10 DIAGNOSIS — I10 Essential (primary) hypertension: Secondary | ICD-10-CM

## 2017-08-10 DIAGNOSIS — F0150 Vascular dementia without behavioral disturbance: Secondary | ICD-10-CM | POA: Diagnosis not present

## 2017-08-10 DIAGNOSIS — G309 Alzheimer's disease, unspecified: Secondary | ICD-10-CM

## 2017-08-10 DIAGNOSIS — Z79899 Other long term (current) drug therapy: Secondary | ICD-10-CM | POA: Diagnosis not present

## 2017-08-10 DIAGNOSIS — I5042 Chronic combined systolic (congestive) and diastolic (congestive) heart failure: Secondary | ICD-10-CM

## 2017-08-10 DIAGNOSIS — R269 Unspecified abnormalities of gait and mobility: Secondary | ICD-10-CM

## 2017-08-10 DIAGNOSIS — B370 Candidal stomatitis: Secondary | ICD-10-CM

## 2017-08-10 NOTE — Progress Notes (Signed)
Patient ID: [name], female   DOB: 10-Jun-1919, 84 y.o.   MRN: [id number]   [facility] OFFICE  Provider: DR Elmon Kirschner  Code Status:  Goals of Care:  Advanced Directives 06/21/2016  Does Patient Have a Medical Advance Directive? No  Pre-existing out of facility DNR order (yellow form or pink MOST form) -     Chief Complaint  Patient presents with  . Acute Visit    Rapid decline in health since Feb 2019, patient is less active and weight loss (unable to stand or obtain weight). Here with daughter    . Medication Management    Patient/daughter needs to discuss medications, patient not taking several medications on list     HPI: Patient is a 84 y.o. female seen today for an acute visit for change in mental status and decreased ADLs. Since birthday earlier this month, she is unable to feed herself and requires frequent cues to eat, dress, groom, and bathe. She is incontinent of bowel and urine. Daughter is very frustrated and is unable to care for her increased needs. Daughter reports she noticed sharp decline in last several days with increased sleeping and decreased appetite. She has lost several lbs in last several days. She is interested in SNF long term.  Pt reports no CP, SOB. Appetite ok. Sleeps well but does talk in her sleep. She has occasional auditory and visual hallucinations. No behavioral disturbances. She is incontinent of urine and stool. She feeds herself. She needs assistance with meal prep, grooming, bathing. She is immobile and requires total assistance with transfers and most ADLs. Daughter uses w/c at home. She was transported on stretcher by EMS today.   COPD - no recent exacerbations. Last CT chest wo contrast in 2014 revealed multiple ground glass densities s/o interstitial fibrotic change; CXR in 2016 showed interstitial markings had improved but still present s/o chronic process.  Hx CHF/HTN/CAD - stable on imdur, lasix and plavix  Dementia, Alzheimer's and  vascular - probably end - stage. She currently takes namenda and aricept  Urinary incontinence - unchanged on vesicare  Past Medical History:  Diagnosis Date  . CHF (congestive heart failure) ([facility])   . Constipation   . COPD (chronic obstructive pulmonary disease) ([facility])   . Dementia   . Frequency of urination   . Hypertension   . Incontinence   . Pneumonia   . Spinal stenosis     Past Surgical History:  Procedure Laterality Date  . COLONOSCOPY  1991     reports that she quit smoking about 38 years ago. she has never used smokeless tobacco. She reports that she does not drink alcohol or use drugs. Social History   Socioeconomic History  . Marital status: Widowed    Spouse name: Not on file  . Number of children: Not on file  . Years of education: Not on file  . Highest education level: Not on file  Social Needs  . Financial resource strain: Not on file  . Food insecurity - worry: Not on file  . Food insecurity - inability: Not on file  . Transportation needs - medical: Not on file  . Transportation needs - non-medical: Not on file  Occupational History  . Not on file  Tobacco Use  . Smoking status: Former Smoker    Last attempt to quit: 09/27/1978    Years since quitting: 38.8  . Smokeless tobacco: Never Used  Substance and Sexual Activity  . Alcohol use: No  . Drug  use: No  . Sexual activity: Not on file  Other Topics Concern  . Not on file  Social History Narrative  . Not on file    Family History  Problem Relation Age of Onset  . Cancer Brother   . Hypertension Daughter   . Cancer Son     No Known Allergies  Outpatient Encounter Medications as of 08/10/2017  Medication Sig  . clopidogrel (PLAVIX) 75 MG tablet TAKE 1 TABLET (75 MG TOTAL) BY MOUTH DAILY.  Marland Kitchen donepezil (ARICEPT) 10 MG tablet TAKE 1 TABLET (10 MG TOTAL) BY MOUTH DAILY.  . furosemide (LASIX) 20 MG tablet TAKE 1 TABLET (20 MG TOTAL) BY MOUTH DAILY.  . isosorbide mononitrate (IMDUR) 30 MG 24  hr tablet TAKE 1 TABLET (30 MG TOTAL) BY MOUTH DAILY.  . Misc. Devices (COMMODE BEDSIDE) MISC Drop arm bedside commode  . VESICARE 5 MG tablet TAKE 1 TABLET EVERY DAY  . albuterol (PROVENTIL) (2.5 MG/3ML) 0.083% nebulizer solution Take 3 mLs (2.5 mg total) by nebulization every 6 (six) hours as needed for wheezing or shortness of breath. (Patient not taking: Reported on 08/10/2017)  . cholecalciferol (VITAMIN D) 1000 UNITS tablet TAKE ONE CAPSULE EVERY DAY (Patient not taking: Reported on 08/10/2017)  . memantine (NAMENDA XR) 28 MG CP24 24 hr capsule Take 1 capsule (28 mg total) by mouth daily. (Patient not taking: Reported on 08/10/2017)  . metoprolol tartrate (LOPRESSOR) 25 MG tablet Take 0.5 tablets (12.5 mg total) by mouth daily. For blood pressure and heart (Patient not taking: Reported on 08/10/2017)  . nystatin (MYCOSTATIN/NYSTOP) powder Apply topically 3 (three) times daily. Use for 2 weeks or until rash resolved (Patient not taking: Reported on 08/10/2017)  . [DISCONTINUED] Memantine HCl ER (NAMENDA XR TITRATION PACK) 7 & 14 & 21 &28 MG CP24 Take 1 capsule by mouth daily.   No facility-administered encounter medications on file as of 08/10/2017.     Review of Systems:  Review of Systems  Unable to perform ROS: Dementia    Health Maintenance  Topic Date Due  . TETANUS/TDAP  07/28/1940  . DEXA SCAN  07/29/1986  . PNA vac Low Risk Adult (2 of 2 - PCV13) 05/29/1993  . INFLUENZA VACCINE  12/27/2016    Physical Exam: Vitals:   08/10/17 0918  BP: 128/76  Pulse: 76  Temp: 98.1 F (36.7 C)  TempSrc: Oral  SpO2: 97%   There is no height or weight on file to calculate BMI. Physical Exam  Constitutional: She appears well-developed.  Frail appearing in NAD, sitting in w/c  HENT:  Mouth/Throat: Oropharyngeal exudate (2/2 thrush) present.  (+) oral thrush; MM dry; poor dentition with multiple dental caries, broken teeth  Eyes: Pupils are equal, round, and reactive to light. No scleral  icterus.  Neck: Neck supple. Carotid bruit is not present. No tracheal deviation present. No thyromegaly present.  Cardiovascular: Normal rate, regular rhythm and intact distal pulses. Exam reveals no gallop and no friction rub.  Murmur (2/6 SEM) heard. No LE edema b/l. no calf TTP.   Pulmonary/Chest: Effort normal and breath sounds normal. No stridor. No respiratory distress. She has no wheezes. She has no rales. She exhibits no tenderness.  Reduced BS b/l at base  Abdominal: Soft. Normal appearance and bowel sounds are normal. She exhibits no distension and no mass. There is no hepatomegaly. There is no tenderness. There is no rigidity, no rebound and no guarding. No hernia.  Musculoskeletal: She exhibits edema.  Lymphadenopathy:  She has no cervical adenopathy.  Neurological: She is alert.  Skin: Skin is warm and dry. Rash (dry red; patchy; no vesicles) noted.  (+) tenting  Psychiatric: She has a normal mood and affect. Her behavior is normal. Cognition and memory are impaired.    Labs reviewed: Basic Metabolic Panel: No results for input(s): NA, K, CL, CO2, GLUCOSE, BUN, CREATININE, CALCIUM, MG, PHOS, TSH in the last 8760 hours. Liver Function Tests: No results for input(s): AST, ALT, ALKPHOS, BILITOT, PROT, ALBUMIN in the last 8760 hours. No results for input(s): LIPASE, AMYLASE in the last 8760 hours. No results for input(s): AMMONIA in the last 8760 hours. CBC: No results for input(s): WBC, NEUTROABS, HGB, HCT, MCV, PLT in the last 8760 hours. Lipid Panel: No results for input(s): CHOL, HDL, LDLCALC, TRIG, CHOLHDL, LDLDIRECT in the last 8760 hours. Lab Results  Component Value Date   HGBA1C 5.5 06/30/2014    Procedures since last visit: No results found.  Assessment/Plan   ICD-10-CM   1. Weight loss R63.4 CMP with eGFR([facility])    CBC with Differential/Platelets    TSH  2. Oral candidiasis B37.0   3. Abnormality of gait R26.9   4. Mixed Alzheimer's and vascular  dementia G30.9 CMP with eGFR([facility])   F01.50    F02.80   5. Chronic combined systolic and diastolic heart failure (HCC) I50.42   6. Essential hypertension, benign I10 CMP with eGFR([facility])  7. Pulmonary emphysema, unspecified emphysema type (HCC) J43.9   8. High risk medication use Z79.899 CMP with eGFR([facility])    CBC with Differential/Platelets    TSH  9. Altered mental status, unspecified altered mental status type R41.82    Will call with lab results  DRINK ENSURE OR BOOST AT LEAST 3 TIMES DAILY  Continue current medications as ordered  Will send script for diflucan to pharmacy once liver/renal function known. Will need to take x 7 days  RECOMMEND SNF LONG TERM PLACEMENT  WILL CALL WITH PALLIATIVE CARE REFERRAL  Follow up in 2 mos for FTT, weight loss, dementia, CHF   Tayli Buch S. Ancil Linsey  [facility] [address] [place], [place] [address] [phone] Cell (Monday-Friday 8 AM - 5 PM) [phone] After 5 PM and follow prompts

## 2017-08-10 NOTE — Telephone Encounter (Signed)
I called Palliative Care, spoke with Byrd HesselbachMaria and informed her that Dr.Carter requested assessment.  Byrd HesselbachMaria indicated that Palliative Care has access to Epic, Byrd HesselbachMaria also asked who the patient's contact person is and I advised daughter Gaspar ColaJaqueline

## 2017-08-10 NOTE — Patient Instructions (Addendum)
Will call with lab results  DRINK ENSURE OR BOOST AT LEAST 3 TIMES DAILY  Continue current medications as ordered  Will send script for diflucan to pharmacy once liver function known. Will need to take x 7 days  RECOMMEND SNF LONG TERM PLACEMENT  WILL CALL WITH PALLIATIVE CARE REFERRAL  Follow up in 2 mos for FTT, weight loss, dementia, CHF

## 2017-08-11 ENCOUNTER — Inpatient Hospital Stay (HOSPITAL_COMMUNITY)
Admission: EM | Admit: 2017-08-11 | Discharge: 2017-08-13 | DRG: 641 | Disposition: A | Discharge status: Skilled Nursing Facility | Payer: Medicare Other | Attending: Family Medicine | Admitting: Family Medicine

## 2017-08-11 ENCOUNTER — Emergency Department (HOSPITAL_COMMUNITY): Payer: Medicare Other

## 2017-08-11 DIAGNOSIS — E875 Hyperkalemia: Secondary | ICD-10-CM | POA: Diagnosis present

## 2017-08-11 DIAGNOSIS — F411 Generalized anxiety disorder: Secondary | ICD-10-CM | POA: Diagnosis present

## 2017-08-11 DIAGNOSIS — R627 Adult failure to thrive: Secondary | ICD-10-CM | POA: Diagnosis present

## 2017-08-11 DIAGNOSIS — F0150 Vascular dementia without behavioral disturbance: Secondary | ICD-10-CM | POA: Diagnosis present

## 2017-08-11 DIAGNOSIS — E860 Dehydration: Secondary | ICD-10-CM | POA: Diagnosis present

## 2017-08-11 DIAGNOSIS — E872 Acidosis, unspecified: Secondary | ICD-10-CM

## 2017-08-11 DIAGNOSIS — I444 Left anterior fascicular block: Secondary | ICD-10-CM | POA: Diagnosis present

## 2017-08-11 DIAGNOSIS — F0280 Dementia in other diseases classified elsewhere without behavioral disturbance: Secondary | ICD-10-CM | POA: Diagnosis present

## 2017-08-11 DIAGNOSIS — I440 Atrioventricular block, first degree: Secondary | ICD-10-CM | POA: Diagnosis present

## 2017-08-11 DIAGNOSIS — R3981 Functional urinary incontinence: Secondary | ICD-10-CM | POA: Diagnosis present

## 2017-08-11 DIAGNOSIS — N179 Acute kidney failure, unspecified: Secondary | ICD-10-CM | POA: Diagnosis present

## 2017-08-11 DIAGNOSIS — N390 Urinary tract infection, site not specified: Secondary | ICD-10-CM

## 2017-08-11 DIAGNOSIS — I2510 Atherosclerotic heart disease of native coronary artery without angina pectoris: Secondary | ICD-10-CM | POA: Diagnosis present

## 2017-08-11 DIAGNOSIS — E559 Vitamin D deficiency, unspecified: Secondary | ICD-10-CM | POA: Diagnosis present

## 2017-08-11 DIAGNOSIS — F419 Anxiety disorder, unspecified: Secondary | ICD-10-CM | POA: Diagnosis present

## 2017-08-11 DIAGNOSIS — I5032 Chronic diastolic (congestive) heart failure: Secondary | ICD-10-CM | POA: Diagnosis present

## 2017-08-11 DIAGNOSIS — F05 Delirium due to known physiological condition: Secondary | ICD-10-CM | POA: Diagnosis not present

## 2017-08-11 DIAGNOSIS — B372 Candidiasis of skin and nail: Secondary | ICD-10-CM | POA: Diagnosis present

## 2017-08-11 DIAGNOSIS — Z79899 Other long term (current) drug therapy: Secondary | ICD-10-CM

## 2017-08-11 DIAGNOSIS — J449 Chronic obstructive pulmonary disease, unspecified: Secondary | ICD-10-CM | POA: Diagnosis present

## 2017-08-11 DIAGNOSIS — G309 Alzheimer's disease, unspecified: Secondary | ICD-10-CM | POA: Diagnosis present

## 2017-08-11 DIAGNOSIS — E785 Hyperlipidemia, unspecified: Secondary | ICD-10-CM | POA: Diagnosis present

## 2017-08-11 DIAGNOSIS — N182 Chronic kidney disease, stage 2 (mild): Secondary | ICD-10-CM | POA: Diagnosis present

## 2017-08-11 DIAGNOSIS — I5042 Chronic combined systolic (congestive) and diastolic (congestive) heart failure: Secondary | ICD-10-CM | POA: Diagnosis present

## 2017-08-11 DIAGNOSIS — I10 Essential (primary) hypertension: Secondary | ICD-10-CM | POA: Insufficient documentation

## 2017-08-11 DIAGNOSIS — E870 Hyperosmolality and hypernatremia: Secondary | ICD-10-CM | POA: Diagnosis not present

## 2017-08-11 DIAGNOSIS — J439 Emphysema, unspecified: Secondary | ICD-10-CM | POA: Diagnosis present

## 2017-08-11 DIAGNOSIS — B370 Candidal stomatitis: Secondary | ICD-10-CM | POA: Diagnosis present

## 2017-08-11 DIAGNOSIS — R4182 Altered mental status, unspecified: Secondary | ICD-10-CM

## 2017-08-11 DIAGNOSIS — I130 Hypertensive heart and chronic kidney disease with heart failure and stage 1 through stage 4 chronic kidney disease, or unspecified chronic kidney disease: Secondary | ICD-10-CM | POA: Diagnosis present

## 2017-08-11 DIAGNOSIS — G9349 Other encephalopathy: Secondary | ICD-10-CM | POA: Diagnosis present

## 2017-08-11 DIAGNOSIS — Z7902 Long term (current) use of antithrombotics/antiplatelets: Secondary | ICD-10-CM

## 2017-08-11 DIAGNOSIS — D72829 Elevated white blood cell count, unspecified: Secondary | ICD-10-CM

## 2017-08-11 DIAGNOSIS — I252 Old myocardial infarction: Secondary | ICD-10-CM

## 2017-08-11 DIAGNOSIS — Z87891 Personal history of nicotine dependence: Secondary | ICD-10-CM

## 2017-08-11 DIAGNOSIS — Z6820 Body mass index (BMI) 20.0-20.9, adult: Secondary | ICD-10-CM | POA: Diagnosis not present

## 2017-08-11 LAB — CBC WITH DIFFERENTIAL/PLATELET
BASOS ABS: 0 10*3/uL (ref 0.0–0.1)
BASOS PCT: 0 %
Basophils Absolute: 34 cells/uL (ref 0–200)
Basophils Relative: 0.2 %
EOS PCT: 0 %
Eosinophils Absolute: 0 cells/uL — ABNORMAL LOW (ref 15–500)
Eosinophils Absolute: 0 10*3/uL (ref 0.0–0.7)
Eosinophils Relative: 0 %
HCT: 35.5 % (ref 35.0–45.0)
HCT: 37.7 % (ref 36.0–46.0)
HEMOGLOBIN: 11.8 g/dL — AB (ref 12.0–15.0)
Hemoglobin: 12.1 g/dL (ref 11.7–15.5)
LYMPHS PCT: 12 %
Lymphs Abs: 1778 cells/uL (ref 850–3900)
Lymphs Abs: 1.6 10*3/uL (ref 0.7–4.0)
MCH: 30.1 pg (ref 27.0–33.0)
MCH: 29.7 pg (ref 26.0–34.0)
MCHC: 34.1 g/dL (ref 32.0–36.0)
MCHC: 31.3 g/dL (ref 30.0–36.0)
MCV: 88.3 fL (ref 80.0–100.0)
MCV: 95 fL (ref 78.0–100.0)
MONOS PCT: 5.7 %
MPV: 11.8 fL (ref 7.5–12.5)
Monocytes Absolute: 0.6 10*3/uL (ref 0.1–1.0)
Monocytes Relative: 5 %
NEUTROS ABS: 11.5 10*3/uL — AB (ref 1.7–7.7)
NEUTROS PCT: 83.7 %
NEUTROS PCT: 83 %
Neutro Abs: 14313 cells/uL — ABNORMAL HIGH (ref 1500–7800)
PLATELETS: 162 10*3/uL (ref 140–400)
Platelets: 154 10*3/uL (ref 150–400)
RBC: 4.02 10*6/uL (ref 3.80–5.10)
RBC: 3.97 MIL/uL (ref 3.87–5.11)
RDW: 15 % (ref 11.0–15.0)
RDW: 16.9 % — ABNORMAL HIGH (ref 11.5–15.5)
TOTAL LYMPHOCYTE: 10.4 %
WBC mixed population: 975 cells/uL — ABNORMAL HIGH (ref 200–950)
WBC: 17.1 10*3/uL — AB (ref 3.8–10.8)
WBC: 13.8 10*3/uL — ABNORMAL HIGH (ref 4.0–10.5)

## 2017-08-11 LAB — COMPLETE METABOLIC PANEL WITH GFR
AG Ratio: 1 (calc) (ref 1.0–2.5)
ALBUMIN MSPROF: 3.5 g/dL — AB (ref 3.6–5.1)
ALT: 11 U/L (ref 6–29)
AST: 17 U/L (ref 10–35)
Alkaline phosphatase (APISO): 65 U/L (ref 33–130)
BUN / CREAT RATIO: 22 (calc) (ref 6–22)
BUN: 38 mg/dL — ABNORMAL HIGH (ref 7–25)
CALCIUM: 9.4 mg/dL (ref 8.6–10.4)
CO2: 29 mmol/L (ref 20–32)
CREATININE: 1.72 mg/dL — AB (ref 0.60–0.88)
Chloride: 120 mmol/L — ABNORMAL HIGH (ref 98–110)
GFR, EST AFRICAN AMERICAN: 29 mL/min/{1.73_m2} — AB (ref >=60)
GFR, EST NON AFRICAN AMERICAN: 25 mL/min/{1.73_m2} — AB (ref >=60)
GLOBULIN: 3.5 g/dL (ref 1.9–3.7)
Glucose, Bld: 94 mg/dL (ref 65–99)
Potassium: 3.8 mmol/L (ref 3.5–5.3)
SODIUM: 159 mmol/L — AB (ref 135–146)
TOTAL PROTEIN: 7 g/dL (ref 6.1–8.1)
Total Bilirubin: 0.5 mg/dL (ref 0.2–1.2)

## 2017-08-11 LAB — URINALYSIS, ROUTINE W REFLEX MICROSCOPIC
Bilirubin Urine: NEGATIVE
GLUCOSE, UA: NEGATIVE mg/dL
Hgb urine dipstick: NEGATIVE
Ketones, ur: NEGATIVE mg/dL
LEUKOCYTES UA: NEGATIVE
NITRITE: NEGATIVE
PH: 5 (ref 5.0–8.0)
Protein, ur: NEGATIVE mg/dL
Specific Gravity, Urine: 1.017 (ref 1.005–1.030)

## 2017-08-11 LAB — BASIC METABOLIC PANEL
Anion gap: 16 — ABNORMAL HIGH (ref 5–15)
BUN: 47 mg/dL — ABNORMAL HIGH (ref 6–20)
CHLORIDE: 118 mmol/L — AB (ref 101–111)
CO2: 20 mmol/L — ABNORMAL LOW (ref 22–32)
Calcium: 8.5 mg/dL — ABNORMAL LOW (ref 8.9–10.3)
Creatinine, Ser: 1.78 mg/dL — ABNORMAL HIGH (ref 0.44–1.00)
GFR, EST AFRICAN AMERICAN: 27 mL/min — AB (ref >60)
GFR, EST NON AFRICAN AMERICAN: 23 mL/min — AB (ref >60)
Glucose, Bld: 100 mg/dL — ABNORMAL HIGH (ref 65–99)
POTASSIUM: 3.8 mmol/L (ref 3.5–5.1)
SODIUM: 154 mmol/L — AB (ref 135–145)

## 2017-08-11 LAB — LACTIC ACID, PLASMA
Lactic Acid, Venous: 3 mmol/L (ref 0.5–1.9)
Lactic Acid, Venous: 2 mmol/L (ref 0.5–1.9)

## 2017-08-11 LAB — COMPREHENSIVE METABOLIC PANEL
ALT: 14 U/L (ref 14–54)
AST: 54 U/L — ABNORMAL HIGH (ref 15–41)
Albumin: 3.1 g/dL — ABNORMAL LOW (ref 3.5–5.0)
Alkaline Phosphatase: 59 U/L (ref 38–126)
Anion gap: 12 (ref 5–15)
BUN: 50 mg/dL — ABNORMAL HIGH (ref 6–20)
CHLORIDE: 118 mmol/L — AB (ref 101–111)
CO2: 24 mmol/L (ref 22–32)
CREATININE: 1.96 mg/dL — AB (ref 0.44–1.00)
Calcium: 8.6 mg/dL — ABNORMAL LOW (ref 8.9–10.3)
GFR calc non Af Amer: 20 mL/min — ABNORMAL LOW (ref >60)
GFR, EST AFRICAN AMERICAN: 24 mL/min — AB (ref >60)
Glucose, Bld: 120 mg/dL — ABNORMAL HIGH (ref 65–99)
POTASSIUM: 5.2 mmol/L — AB (ref 3.5–5.1)
SODIUM: 154 mmol/L — AB (ref 135–145)
Total Bilirubin: 1.4 mg/dL — ABNORMAL HIGH (ref 0.3–1.2)
Total Protein: 6.9 g/dL (ref 6.5–8.1)

## 2017-08-11 LAB — I-STAT CHEM 8, ED
BUN: 51 mg/dL — ABNORMAL HIGH (ref 6–20)
CALCIUM ION: 1.14 mmol/L — AB (ref 1.15–1.40)
CHLORIDE: 119 mmol/L — AB (ref 101–111)
Creatinine, Ser: 1.8 mg/dL — ABNORMAL HIGH (ref 0.44–1.00)
GLUCOSE: 118 mg/dL — AB (ref 65–99)
HCT: 34 % — ABNORMAL LOW (ref 36.0–46.0)
Hemoglobin: 11.6 g/dL — ABNORMAL LOW (ref 12.0–15.0)
POTASSIUM: 3.8 mmol/L (ref 3.5–5.1)
SODIUM: 160 mmol/L — AB (ref 135–145)
TCO2: 28 mmol/L (ref 22–32)

## 2017-08-11 LAB — I-STAT CG4 LACTIC ACID, ED
LACTIC ACID, VENOUS: 2.29 mmol/L — AB (ref 0.5–1.9)
Lactic Acid, Venous: 2.78 mmol/L (ref 0.5–1.9)

## 2017-08-11 LAB — PROTIME-INR
INR: 1.26
Prothrombin Time: 15.7 seconds — ABNORMAL HIGH (ref 11.4–15.2)

## 2017-08-11 LAB — TROPONIN I: TROPONIN I: 0.17 ng/mL — AB (ref <0.03)

## 2017-08-11 LAB — TSH: TSH: 2.08 mIU/L (ref 0.40–4.50)

## 2017-08-11 LAB — BRAIN NATRIURETIC PEPTIDE: B NATRIURETIC PEPTIDE 5: 387.1 pg/mL — AB (ref 0.0–100.0)

## 2017-08-11 MED ORDER — HEPARIN SODIUM (PORCINE) 5000 UNIT/ML IJ SOLN
5000.0000 [IU] | Freq: Three times a day (TID) | INTRAMUSCULAR | Status: DC
  Administered 2017-08-11 – 2017-08-13 (×6): 5000 [IU] via SUBCUTANEOUS
  Filled 2017-08-11 (×6): qty 1

## 2017-08-11 MED ORDER — ONDANSETRON HCL 4 MG PO TABS
4.0000 mg | ORAL_TABLET | Freq: Four times a day (QID) | ORAL | Status: DC | PRN
  Filled 2017-08-11: qty 1

## 2017-08-11 MED ORDER — DONEPEZIL HCL 10 MG PO TABS
10.0000 mg | ORAL_TABLET | Freq: Every day | ORAL | Status: DC
  Administered 2017-08-12: 10 mg via ORAL
  Filled 2017-08-11: qty 1

## 2017-08-11 MED ORDER — LACTATED RINGERS IV SOLN
INTRAVENOUS | Status: DC
  Administered 2017-08-11: 16:00:00 via INTRAVENOUS

## 2017-08-11 MED ORDER — METOPROLOL TARTRATE 5 MG/5ML IV SOLN: 5.0000 mg | Freq: Four times a day (QID) | INTRAVENOUS | Status: DC | PRN

## 2017-08-11 MED ORDER — ONDANSETRON HCL 4 MG/2ML IJ SOLN: 4.0000 mg | Freq: Four times a day (QID) | INTRAMUSCULAR | Status: DC | PRN

## 2017-08-11 MED ORDER — ACETAMINOPHEN 650 MG RE SUPP: 650.0000 mg | Freq: Four times a day (QID) | RECTAL | Status: DC | PRN

## 2017-08-11 MED ORDER — ACETAMINOPHEN 325 MG PO TABS: 650.0000 mg | ORAL_TABLET | Freq: Four times a day (QID) | ORAL | Status: DC | PRN

## 2017-08-11 MED ORDER — DEXTROSE 5 % IV SOLN
INTRAVENOUS | Status: DC
  Administered 2017-08-11: 20:00:00 via INTRAVENOUS

## 2017-08-11 MED ORDER — LACTATED RINGERS IV BOLUS (SEPSIS)
1000.0000 mL | Freq: Once | INTRAVENOUS | Status: AC
  Administered 2017-08-11: 1000 mL via INTRAVENOUS

## 2017-08-11 MED ORDER — LACTATED RINGERS IV BOLUS (SEPSIS)
500.0000 mL | Freq: Once | INTRAVENOUS | Status: DC
Start: 2017-08-11 — End: 2017-08-11
  Administered 2017-08-11: 16:00:00 via INTRAVENOUS

## 2017-08-11 MED ORDER — BISACODYL 10 MG RE SUPP: 10.0000 mg | Freq: Every day | RECTAL | Status: DC | PRN

## 2017-08-11 MED ORDER — SODIUM CHLORIDE 0.9 % IV SOLN
1.0000 g | Freq: Every day | INTRAVENOUS | Status: DC
  Administered 2017-08-11 – 2017-08-12 (×2): 1 g via INTRAVENOUS
  Filled 2017-08-11 (×3): qty 10

## 2017-08-11 MED ORDER — MEMANTINE HCL ER 28 MG PO CP24
28.0000 mg | ORAL_CAPSULE | Freq: Every day | ORAL | Status: DC
  Administered 2017-08-12 – 2017-08-13 (×2): 28 mg via ORAL
  Filled 2017-08-11 (×2): qty 1

## 2017-08-11 MED ORDER — NYSTATIN 100000 UNIT/GM EX POWD
Freq: Two times a day (BID) | CUTANEOUS | Status: DC
  Administered 2017-08-11 – 2017-08-13 (×4): via TOPICAL
  Filled 2017-08-11 (×3): qty 15

## 2017-08-11 NOTE — ED Notes (Signed)
Attempted report x1. RN unavailable @ this time. Callback # left. 

## 2017-08-11 NOTE — ED Triage Notes (Signed)
EMS reports that family stated they've noticed increased AMS for approx 2 weeks.  Pt is baseline Alerted with dementia.  VSS upon arrival to ED

## 2017-08-11 NOTE — ED Notes (Signed)
Paged [place] to [employer], RCharity fundraiser

## 2017-08-11 NOTE — H&P (Addendum)
History and Physical    [name] ZOX:[id number] DOB: 07/26/1919 DOA: 08/11/2017   PCP: Kirt Boysarter, Monica, DO   Patient coming from:  Home    Chief Complaint:  HPI: [name] is a 84 y.o. female with medical history significant for dementia since 2004, hypertension, hyperlipidemia, diastolic CHF with EF 40-45%, COPD, history of spinal stenosis, CAD status post and STEMI in December 2014, CKD, prior C. difficile infection in the family 2016, blood to the emergency department with 2-3 weeks history of worsening altered mental status, failure to thrive, poor oral intake, weight loss.  Patient was sent by her PCP after blood work yesterday resulted in very abnormal labs, including a leukocytosis of 17,000, hyponatremia with a sodium of 158, AK I.  History is obtained by her daughters, as the patient is unable to provide further information due to level 5 caveat due to dementia, and her acute confusion.  Per report, the patient has had very poor appetite, and has been increasingly weaker.  Daughter is not aware if there are any recent infections, respiratory or cardiac complaints on the patient.  She is not aware of the patient having any worsening lower extremity swelling, or signs of cellulitis.Of note, her urine is very odorous, but unable to obtain information regarding dysuria or lower abdominal pain .    ED Course:  BP 135/88 (BP Location: Right Arm)   Pulse 88   Temp 97.9 F (36.6 C) (Oral)   Resp 18   Ht 4\' 10"  (1.473 m)   Wt 45.4 kg (100 lb)   SpO2 98%   BMI 20.90 kg/m    Sinus rhythm Ventricular premature complex Short PR interval Left anterior fascicular block ST-t wave abnormality At the ED, her leukocytosis has improved at 17,000-14,000 Lactic acid 2.8, now 2.29  Na  went from 158-154, now 160 here Potassium increased from 5.2-3.8 Creatinine 1.9 to 1.8  BNP 387  Received LR 500 bolus and now at 75 cc/h  Chest x-ray with no acute findings UA lg leukocytes    Review of Systems: Due to Level V Caveat, unable to obtain further information   Past Medical History:  Diagnosis Date  . CHF (congestive heart failure) (HCC)   . Constipation   . COPD (chronic obstructive pulmonary disease) (HCC)   . Dementia   . Frequency of urination   . Hypertension   . Incontinence   . Pneumonia   . Spinal stenosis     Past Surgical History:  Procedure Laterality Date  . COLONOSCOPY  1991    Social History Social History   Socioeconomic History  . Marital status: Widowed    Spouse name: Not on file  . Number of children: Not on file  . Years of education: Not on file  . Highest education level: Not on file  Social Needs  . Financial resource strain: Not on file  . Food insecurity - worry: Not on file  . Food insecurity - inability: Not on file  . Transportation needs - medical: Not on file  . Transportation needs - non-medical: Not on file  Occupational History  . Not on file  Tobacco Use  . Smoking status: Former Smoker    Last attempt to quit: 09/27/1978    Years since quitting: 38.8  . Smokeless tobacco: Never Used  Substance and Sexual Activity  . Alcohol use: No  . Drug use: No  . Sexual activity: Not on file  Other Topics Concern  . Not on  file  Social History Narrative  . Not on file     No Known Allergies  Family History  Problem Relation Age of Onset  . Cancer Brother   . Hypertension Daughter   . Cancer Son       Prior to Admission medications   Medication Sig Start Date End Date Taking? Authorizing Provider  clopidogrel (PLAVIX) 75 MG tablet TAKE 1 TABLET (75 MG TOTAL) BY MOUTH DAILY. 04/04/17  Yes Carter, Monica, DO  donepezil (ARICEPT) 10 MG tablet TAKE 1 TABLET (10 MG TOTAL) BY MOUTH DAILY. 04/04/17  Yes Montez Morita, Monica, DO  furosemide (LASIX) 20 MG tablet TAKE 1 TABLET (20 MG TOTAL) BY MOUTH DAILY. 04/04/17  Yes Montez Morita, Monica, DO  isosorbide mononitrate (IMDUR) 30 MG 24 hr tablet TAKE 1 TABLET (30 MG TOTAL) BY  MOUTH DAILY. 04/04/17  Yes Carter, Monica, DO  VESICARE 5 MG tablet TAKE 1 TABLET EVERY DAY Patient taking differently: TAKE 1 TABLET (5mg ) EVERY DAY 01/01/17  Yes Montez Morita, Monica, DO  albuterol (PROVENTIL) (2.5 MG/3ML) 0.083% nebulizer solution Take 3 mLs (2.5 mg total) by nebulization every 6 (six) hours as needed for wheezing or shortness of breath. Patient not taking: Reported on 08/10/2017 06/21/16   Kirt Boys, DO  cholecalciferol (VITAMIN D) 1000 UNITS tablet TAKE ONE CAPSULE EVERY DAY Patient not taking: Reported on 08/10/2017 03/25/15   Kirt Boys, DO  memantine (NAMENDA XR) 28 MG CP24 24 hr capsule Take 1 capsule (28 mg total) by mouth daily. Patient not taking: Reported on 08/10/2017 06/21/16   Kirt Boys, DO  metoprolol tartrate (LOPRESSOR) 25 MG tablet Take 0.5 tablets (12.5 mg total) by mouth daily. For blood pressure and heart Patient not taking: Reported on 08/10/2017 06/21/16   Kirt Boys, DO    Physical Exam:  Vitals:   08/11/17 1435  BP: 135/88  Pulse: 88  Resp: 18  Temp: 97.9 F (36.6 C)  TempSrc: Oral  SpO2: 98%  Weight: 45.4 kg (100 lb)  Height: 4\' 10"  (1.473 m)   Constitutional: NAD, frail ill appearing, temporal wasting noted  Eyes: PERRL, arcus senilis, eyes sunken  ENMT: Mucous membranes are dry, visible white coating question thrush, no lesions on lips.  Edentulous Neck: normal, supple, no masses, no thyromegaly Respiratory: clear to auscultation bilaterally, no wheezing, no crackles. Normal respiratory effort  Cardiovascular: Regular rate and rhythm,  murmur, rubs or gallops. No extremity edema. 2+ pedal pulses. No carotid bruits.  Abdomen: Soft, no apparent tenderness, No hepatosplenomegaly. Bowel sounds positive.  Musculoskeletal: no clubbing / cyanosis. Moves all extremities Skin: no jaundice, No lesions.  Left breast with some candidiasis but no visible cellulitis.  Skin tenting is noted. Neurologic: Sensation and strength unable to obtain, as  the patient has significant dementia, and appears to be in acute confusion.     Labs on Admission: I have personally reviewed following labs and imaging studies  CBC: Recent Labs  Lab 08/10/17 1055 08/11/17 1435 08/11/17 1542  WBC 17.1* 13.8*  --   NEUTROABS 14,313* 11.5*  --   HGB 12.1 11.8* 11.6*  HCT 35.5 37.7 34.0*  MCV 88.3 95.0  --   PLT 162 154  --     Basic Metabolic Panel: Recent Labs  Lab 08/10/17 1055 08/11/17 1435 08/11/17 1542  NA 159* 154* 160*  K 3.8 5.2* 3.8  CL 120* 118* 119*  CO2 29 24  --   GLUCOSE 94 120* 118*  BUN 38* 50* 51*  CREATININE 1.72* 1.96* 1.80*  CALCIUM 9.4 8.6*  --     GFR: Estimated Creatinine Clearance: 11.8 mL/min (A) (by C-G formula based on SCr of 1.8 mg/dL (H)).  Liver Function Tests: Recent Labs  Lab 08/10/17 1055 08/11/17 1435  AST 17 54*  ALT 11 14  ALKPHOS  --  59  BILITOT 0.5 1.4*  PROT 7.0 6.9  ALBUMIN  --  3.1*   No results for input(s): LIPASE, AMYLASE in the last 168 hours. No results for input(s): AMMONIA in the last 168 hours.  Coagulation Profile: Recent Labs  Lab 08/11/17 1530  INR 1.26    Cardiac Enzymes: No results for input(s): CKTOTAL, CKMB, CKMBINDEX, TROPONINI in the last 168 hours.  BNP (last 3 results) No results for input(s): PROBNP in the last 8760 hours.  HbA1C: No results for input(s): HGBA1C in the last 72 hours.  CBG: No results for input(s): GLUCAP in the last 168 hours.  Lipid Profile: No results for input(s): CHOL, HDL, LDLCALC, TRIG, CHOLHDL, LDLDIRECT in the last 72 hours.  Thyroid Function Tests: Recent Labs    08/10/17 1055  TSH 2.08    Anemia Panel: No results for input(s): VITAMINB12, FOLATE, FERRITIN, TIBC, IRON, RETICCTPCT in the last 72 hours.  Urine analysis:    Component Value Date/Time   COLORURINE YELLOW 07/02/2014 1028   APPEARANCEUR TURBID (A) 07/02/2014 1028   LABSPEC 1.016 07/02/2014 1028   PHURINE 6.5 07/02/2014 1028   GLUCOSEU NEGATIVE  07/02/2014 1028   HGBUR MODERATE (A) 07/02/2014 1028   BILIRUBINUR NEGATIVE 07/02/2014 1028   KETONESUR NEGATIVE 07/02/2014 1028   PROTEINUR NEGATIVE 07/02/2014 1028   UROBILINOGEN 0.2 07/02/2014 1028   NITRITE NEGATIVE 07/02/2014 1028   LEUKOCYTESUR LARGE (A) 07/02/2014 1028    Sepsis Labs: @LABRCNTIP (procalcitonin:4,lacticidven:4) )No results found for this or any previous visit (from the past 240 hour(s)).   Radiological Exams on Admission: Dg Chest 2 View  Result Date: 08/11/2017 CLINICAL DATA:  Worsening white blood cell count. EXAM: CHEST - 2 VIEW COMPARISON:  Chest radiograph 06/29/2014. FINDINGS: Monitoring leads overlie the patient. Stable cardiac and mediastinal contours. Bilateral mid lower lung peripheral linear opacities. No large area pulmonary consolidation. No pleural effusion or pneumothorax. IMPRESSION: No acute cardiopulmonary process. Peripheral linear opacities favored to represent scarring. Electronically Signed   By: Annia Belt M.D.   On: 08/11/2017 15:53    EKG: Independently reviewed.  Assessment/Plan Principal Problem:   Acute confusional state Active Problems:   Acute hypernatremia   Hyperkalemia   Mixed Alzheimer's and vascular dementia   Chronic kidney disease (CKD), stage II (mild)   Dyslipidemia   Anxiety state   COPD (chronic obstructive pulmonary disease) with emphysema (HCC)   Chronic combined systolic and diastolic heart failure (HCC)   Urinary incontinence due to cognitive impairment   Vitamin D insufficiency   Failure to thrive in adult   Essential hypertension, benign   UTI (urinary tract infection)      Acute encephalopathy in the setting of infection and FTT/dehydration, hypernatremia/ UTI . Afebrile . WBC currently 14 K Bicarb 24 .Lactic acid 2.8, now 2.29. Na now 160 (see below note), AKI . CXR NAD, UA large  Leuk . Received IV LR bolus and then LR 75 cc/h. Admit to Inpatient  Correct metabolic and CBC abnormalities, antibiotics  for UTI (see below) IVF with D5 50 cc/h Serial  Lactic acid PT/OT if symtoms Improve  Follow bood and urine cultures    Severe Hypernatremia - sodium is currently 160 Cr 1.8 on  admission; patient appears symptomatic.  This is likely related to severe dehydration, and probably meds.  The patient received a bolus of LR 500 cc, and then 75 cc an hour of same. Cheange IVF to D5 at 50 cc/h  Serial BMETevery 6 hours Monitor closely   Acute hyperkalemia, resolved initially 5.2, now 3.8 .   Recheck BMET  Repeat EKG if indicated Check Troponin Telemetry  IVF    UTI suggested by large leukocytes in urine, odorous urine, unable to determine symptoms of dysuria due to AMS and dementia  WBC is currently 14k. Receiving Rocephin at the ED. Lactic acid 2.78    F/u urine and blood  culture Ceftriaxone IV  Follow CBC in am  IVF with D5 50 cc/h  Serial lactic acid    Hypertension BP   147/79   Pulse 88   Holding oral Lasix and Lopressor at this time, Lopressor IV 5 mg every 6 hours BP greater than 140  Chronic systolic and Diastolic CHF, weight 100 lbs, CXR NAD. Last 2 D echo EF 40-45 percent, mild to mod reduced systolic, diffuse hypokinesis. BNP 387  Obtain daily weights Monitor intake and output Hold Imdur for now/diuretics   Acute on Chronic CKD stage II: likely due to dehydration, diuretics and or NSAIDS.  baseline creatinine 1.1, currently 1.8   Lab Results  Component Value Date   CREATININE 1.80 (H) 08/11/2017   CREATININE 1.96 (H) 08/11/2017   CREATININE 1.72 (H) 08/10/2017   Hold diuretics, NSAIDS, vesicare  IVF currently at 50 cc an hour of D5 due to high sodium level    History of Dementia Continue, if able with Namenda and Aricept   Candidiasis, continue Nystatin for her L breast  Will consider IV diflucan if thrush confirmed in mouth   DVT prophylaxis:  Heparin sq  Code Status:    Full for now, discussion ongoing  Family Communication:  Discussed with daughter,  POA Disposition Plan: Expect patient to be discharged to home after condition improves Consults called:    None  Admission status:    Marlowe Kays, PA-C [facility]   Amion text  [phone]   Patient seen examen in conjunction with PA ms Wertman We formulate dplan together diffult situation-Hypernatremic and poorly repsonsive--tried to address GOC with family who seem unrealisitc-will follow Expect will be here at least 72-96 hours  08/11/2017, 5:14 PM

## 2017-08-11 NOTE — ED Provider Notes (Signed)
[facility] EMERGENCY DEPARTMENT Provider Note   CSN: [id number] Arrival date & time: 08/11/17  1433     History   Chief Complaint Chief Complaint  Patient presents with  . Altered Mental Status    HPI [name] is a 84 y.o. female.  The history is provided by the patient and a relative.  Altered Mental Status   This is a new problem. The current episode started more than 1 week ago. The problem has been gradually worsening. Associated symptoms include confusion. Pertinent negatives include no unresponsiveness. Her past medical history is significant for COPD and dementia. Her past medical history does not include head trauma.  Patient is a 84 year old female with history of CHF, COPD, hypertension, dementia who presents with 2 weeks of worsening altered mental status.  She has had poor appetite and weight loss as well.  Patient seen by PCP yesterday and had some basic blood work drawn significant for a leukocytosis of 17,000, sodium of 158 and an AK I.  Family was called and told him to bring her to the emergency department.  No reported fevers at home.  Patient currently lives at home with daughter.  Past Medical History:  Diagnosis Date  . CHF (congestive heart failure) (HCC)   . Constipation   . COPD (chronic obstructive pulmonary disease) (HCC)   . Dementia   . Frequency of urination   . Hypertension   . Incontinence   . Pneumonia   . Spinal stenosis     Patient Active Problem List   Diagnosis Date Noted  . Essential hypertension, benign 08/11/2017  . High risk medication use 08/11/2017  . Clostridium difficile diarrhea 07/08/2014  . Pressure ulcer of foot, stage 1 07/08/2014  . CAD (coronary artery disease) 07/08/2014  . Acute renal failure (HCC) 06/30/2014  . Failure to thrive in adult 06/30/2014  . Dementia 06/29/2014  . Edema 06/29/2014  . CHF (congestive heart failure) (HCC) 06/29/2014  . UI (urinary incontinence) 06/04/2014  .  Spinal stenosis of lumbar region 10/22/2013  . Chronic combined systolic and diastolic heart failure (HCC) 08/13/2013  . Hypertensive heart disease 08/13/2013  . Urinary incontinence due to cognitive impairment 08/13/2013  . Vitamin D insufficiency 08/13/2013  . Hypocalcemia 05/27/2013  . ACS (acute coronary syndrome) (HCC) 04/25/2013  . Anxiety state, unspecified 04/22/2013  . Depressive disorder, not elsewhere classified 04/22/2013  . COPD (chronic obstructive pulmonary disease) with emphysema (HCC) 04/22/2013  . Hallucinations 04/22/2013  . Abnormality of gait 04/22/2013  . Mixed Alzheimer's and vascular dementia 01/22/2013  . Routine general medical examination at a health care facility 01/22/2013  . Chronic kidney disease (CKD), stage II (mild) 01/22/2013  . Dyslipidemia 01/22/2013  . Cartilage disorder 12/24/2012  . Constipation     Past Surgical History:  Procedure Laterality Date  . COLONOSCOPY  1991    OB History    No data available       Home Medications    Prior to Admission medications   Medication Sig Start Date End Date Taking? Authorizing Provider  clopidogrel (PLAVIX) 75 MG tablet TAKE 1 TABLET (75 MG TOTAL) BY MOUTH DAILY. 04/04/17  Yes Carter, Monica, DO  donepezil (ARICEPT) 10 MG tablet TAKE 1 TABLET (10 MG TOTAL) BY MOUTH DAILY. 04/04/17  Yes Montez Morita, Monica, DO  furosemide (LASIX) 20 MG tablet TAKE 1 TABLET (20 MG TOTAL) BY MOUTH DAILY. 04/04/17  Yes Montez Morita, Monica, DO  isosorbide mononitrate (IMDUR) 30 MG 24 hr tablet TAKE 1  TABLET (30 MG TOTAL) BY MOUTH DAILY. 04/04/17  Yes Carter, Monica, DO  VESICARE 5 MG tablet TAKE 1 TABLET EVERY DAY Patient taking differently: TAKE 1 TABLET (5mg ) EVERY DAY 01/01/17  Yes Montez Moritaarter, Monica, DO  albuterol (PROVENTIL) (2.5 MG/3ML) 0.083% nebulizer solution Take 3 mLs (2.5 mg total) by nebulization every 6 (six) hours as needed for wheezing or shortness of breath. Patient not taking: Reported on 08/10/2017 06/21/16   Kirt Boysarter,  Monica, DO  cholecalciferol (VITAMIN D) 1000 UNITS tablet TAKE ONE CAPSULE EVERY DAY Patient not taking: Reported on 08/10/2017 03/25/15   Kirt Boysarter, Monica, DO  memantine (NAMENDA XR) 28 MG CP24 24 hr capsule Take 1 capsule (28 mg total) by mouth daily. Patient not taking: Reported on 08/10/2017 06/21/16   Kirt Boysarter, Monica, DO  metoprolol tartrate (LOPRESSOR) 25 MG tablet Take 0.5 tablets (12.5 mg total) by mouth daily. For blood pressure and heart Patient not taking: Reported on 08/10/2017 06/21/16   Kirt Boysarter, Monica, DO    Family History Family History  Problem Relation Age of Onset  . Cancer Brother   . Hypertension Daughter   . Cancer Son     Social History Social History   Tobacco Use  . Smoking status: Former Smoker    Last attempt to quit: 09/27/1978    Years since quitting: 38.8  . Smokeless tobacco: Never Used  Substance Use Topics  . Alcohol use: No  . Drug use: No     Allergies   Patient has no known allergies.   Review of Systems Review of Systems  Unable to perform ROS: Dementia  Constitutional: Negative for fever.  Respiratory: Positive for cough (mild non productive). Negative for shortness of breath.   Cardiovascular: Negative for chest pain.  Gastrointestinal: Negative for abdominal pain, diarrhea, nausea and vomiting.  Genitourinary: Negative for dysuria.  Musculoskeletal: Negative for back pain and neck pain.  Skin: Positive for rash (under breasts).  Neurological: Negative for headaches.  Psychiatric/Behavioral: Positive for confusion.  All other systems reviewed and are negative.    Physical Exam Updated Vital Signs BP 135/88 (BP Location: Right Arm)   Pulse 88   Temp 97.9 F (36.6 C) (Oral)   Resp 18   Ht 4\' 10"  (1.473 m)   Wt 45.4 kg (100 lb)   SpO2 98%   BMI 20.90 kg/m   Physical Exam  Constitutional: She appears well-developed and well-nourished. No distress.  HENT:  Head: Normocephalic and atraumatic.  Mouth/Throat: Mucous membranes are  dry.  Thick yellow mucus in mouth and throat  Eyes: Conjunctivae are normal.  Neck: Neck supple.  Cardiovascular: Normal rate and regular rhythm.  No murmur heard. Pulmonary/Chest: Effort normal. No respiratory distress. She has no wheezes. She has no rales.  Shallow breath sounds b/l  Abdominal: Soft. She exhibits no distension. There is no tenderness. There is no guarding.  Musculoskeletal: She exhibits no edema or tenderness.  Neurological: She is alert. She has normal strength. She is disoriented (oriented to self and place). No sensory deficit. GCS eye subscore is 4. GCS verbal subscore is 5. GCS motor subscore is 6.  Skin: Skin is warm and dry.     Psychiatric: She has a normal mood and affect.  Nursing note and vitals reviewed.    ED Treatments / Results  Labs (all labs ordered are listed, but only abnormal results are displayed) Labs Reviewed  COMPREHENSIVE METABOLIC PANEL - Abnormal; Notable for the following components:      Result Value   Sodium  154 (*)    Potassium 5.2 (*)    Chloride 118 (*)    Glucose, Bld 120 (*)    BUN 50 (*)    Creatinine, Ser 1.96 (*)    Calcium 8.6 (*)    Albumin 3.1 (*)    AST 54 (*)    Total Bilirubin 1.4 (*)    GFR calc non Af Amer 20 (*)    GFR calc Af Amer 24 (*)    All other components within normal limits  CBC WITH DIFFERENTIAL/PLATELET - Abnormal; Notable for the following components:   WBC 13.8 (*)    Hemoglobin 11.8 (*)    RDW 16.9 (*)    Neutro Abs 11.5 (*)    All other components within normal limits  PROTIME-INR - Abnormal; Notable for the following components:   Prothrombin Time 15.7 (*)    All other components within normal limits  BRAIN NATRIURETIC PEPTIDE - Abnormal; Notable for the following components:   B Natriuretic Peptide 387.1 (*)    All other components within normal limits  I-STAT CG4 LACTIC ACID, ED - Abnormal; Notable for the following components:   Lactic Acid, Venous 2.78 (*)    All other components  within normal limits  I-STAT CHEM 8, ED - Abnormal; Notable for the following components:   Sodium 160 (*)    Chloride 119 (*)    BUN 51 (*)    Creatinine, Ser 1.80 (*)    Glucose, Bld 118 (*)    Calcium, Ion 1.14 (*)    Hemoglobin 11.6 (*)    HCT 34.0 (*)    All other components within normal limits  I-STAT CG4 LACTIC ACID, ED - Abnormal; Notable for the following components:   Lactic Acid, Venous 2.29 (*)    All other components within normal limits  CULTURE, BLOOD (ROUTINE X 2)  CULTURE, BLOOD (ROUTINE X 2)  URINE CULTURE  URINALYSIS, ROUTINE W REFLEX MICROSCOPIC    EKG  EKG Interpretation  Date/Time:  Saturday August 11 2017 14:47:26 EDT Ventricular Rate:  86 PR Interval:    QRS Duration: 87 QT Interval:  415 QTC Calculation: 497 R Axis:   -56 Text Interpretation:  Sinus rhythm Ventricular premature complex Short PR interval Left anterior fascicular block ST-t wave abnormality Artifact Abnormal ekg Confirmed by Gerhard Munch [phone]) on 08/11/2017 3:34:18 PM       Radiology Dg Chest 2 View  Result Date: 08/11/2017 CLINICAL DATA:  Worsening white blood cell count. EXAM: CHEST - 2 VIEW COMPARISON:  Chest radiograph 06/29/2014. FINDINGS: Monitoring leads overlie the patient. Stable cardiac and mediastinal contours. Bilateral mid lower lung peripheral linear opacities. No large area pulmonary consolidation. No pleural effusion or pneumothorax. IMPRESSION: No acute cardiopulmonary process. Peripheral linear opacities favored to represent scarring. Electronically Signed   By: Annia Belt M.D.   On: 08/11/2017 15:53    Procedures Procedures (including critical care time)  Medications Ordered in ED Medications  lactated ringers bolus 500 mL ( Intravenous New Bag/Given 08/11/17 1628)  nystatin (MYCOSTATIN/NYSTOP) topical powder (not administered)  lactated ringers infusion ( Intravenous New Bag/Given 08/11/17 1628)     Initial Impression / Assessment and Plan / ED Course    I have reviewed the triage vital signs and the nursing notes.  Pertinent labs & imaging results that were available during my care of the patient were reviewed by me and considered in my medical decision making (see chart for details).     Patient is a 84 year old female  with history of dementia, CHF, COPD, hypertension, spinal stenosis, CAD, CKD who presents with 2 weeks of worsening altered mental status, failure to thrive, poor intake, weight loss.  Patient was sent by PCP after blood work resulted from a visit yesterday showing leukocytosis of 17,000, hyponatremia 158, AKI.    On exam patient appears dehydrated.  She is alert and oriented x2 which is her baseline.  Lung sounds are clear.  No obvious signs of cellulitis.  Abdominal exam is benign.  She has a stage I sacral decubitus ulcer.  Labs here show a leukocytosis of 14,000, lactate 2.8, sodium 154, potassium 5.2, BUN of 50, creatinine 1.96 which is up from 1.7 yesterday.  Infectious workup being obtained including chest x-ray, blood cultures, UA, urine culture, serial lactic acid.  Patient only has 1 Sirs criteria and does not meet sepsis criteria.  We will start with 500 cc LR bolus followed by LR infusion at 75 mL/hour.  Second lactic acid improved to 2.2.  Patient will be admitted to the hospitalist for further management of her AK I, hypernatremia, and possible infection.  Awaiting In-N-Out cath urine.  If there is any evidence of UTI plan to start Rocephin.  Final Clinical Impressions(s) / ED Diagnoses   Final diagnoses:  Altered mental status, unspecified altered mental status type  Hypernatremia  AKI (acute kidney injury) (HCC)  Leukocytosis, unspecified type  Lactic acidosis    ED Discharge Orders    None       Dwana Melena, DO 08/11/17 1702    Gerhard Munch, MD 08/12/17 [phone]

## 2017-08-11 NOTE — Progress Notes (Signed)
Pharmacy Antibiotic Note  [name] is a 84 y.o. female admitted on 08/11/2017 with UTI.  Pharmacy has been consulted for ceftriaxone dosing. No renal adjustments needed, pharmacy will sign off.  Plan: Ceftriaxone 1G IV QD  Height: 4\' 10"  (147.3 cm) Weight: 100 lb (45.4 kg) IBW/kg (Calculated) : 40.9  Temp (24hrs), Avg:97.9 F (36.6 C), Min:97.9 F (36.6 C), Max:97.9 F (36.6 C)  Recent Labs  Lab 08/10/17 1055 08/11/17 1435 08/11/17 1512 08/11/17 1542 08/11/17 1652  WBC 17.1* 13.8*  --   --   --   CREATININE 1.72* 1.96*  --  1.80*  --   LATICACIDVEN  --   --  2.78*  --  2.29*    Estimated Creatinine Clearance: 11.8 mL/min (A) (by C-G formula based on SCr of 1.8 mg/dL (H)).    No Known Allergies  Thank you for allowing pharmacy to be a part of this patient's care.  Toniann Failony L Rossie Scarfone 08/11/2017 5:24 PM

## 2017-08-11 NOTE — ED Notes (Signed)
Paged admitting. Pt bp low and lactic still 2.0. Awaiting call back.

## 2017-08-12 LAB — BASIC METABOLIC PANEL
ANION GAP: 12 (ref 5–15)
ANION GAP: 10 (ref 5–15)
BUN: 44 mg/dL — ABNORMAL HIGH (ref 6–20)
BUN: 38 mg/dL — ABNORMAL HIGH (ref 6–20)
CHLORIDE: 118 mmol/L — AB (ref 101–111)
CHLORIDE: 113 mmol/L — AB (ref 101–111)
CO2: 23 mmol/L (ref 22–32)
CO2: 24 mmol/L (ref 22–32)
Calcium: 8.3 mg/dL — ABNORMAL LOW (ref 8.9–10.3)
Calcium: 8.1 mg/dL — ABNORMAL LOW (ref 8.9–10.3)
Creatinine, Ser: 1.7 mg/dL — ABNORMAL HIGH (ref 0.44–1.00)
Creatinine, Ser: 1.54 mg/dL — ABNORMAL HIGH (ref 0.44–1.00)
GFR calc Af Amer: 28 mL/min — ABNORMAL LOW (ref >60)
GFR calc Af Amer: 32 mL/min — ABNORMAL LOW (ref >60)
GFR calc non Af Amer: 24 mL/min — ABNORMAL LOW (ref >60)
GFR calc non Af Amer: 27 mL/min — ABNORMAL LOW (ref >60)
GLUCOSE: 150 mg/dL — AB (ref 65–99)
Glucose, Bld: 89 mg/dL (ref 65–99)
POTASSIUM: 3.4 mmol/L — AB (ref 3.5–5.1)
POTASSIUM: 3 mmol/L — AB (ref 3.5–5.1)
SODIUM: 153 mmol/L — AB (ref 135–145)
SODIUM: 147 mmol/L — AB (ref 135–145)

## 2017-08-12 LAB — URINE CULTURE: Culture: NO GROWTH

## 2017-08-12 LAB — CBC
HCT: 32.9 % — ABNORMAL LOW (ref 36.0–46.0)
HEMOGLOBIN: 10.1 g/dL — AB (ref 12.0–15.0)
MCH: 29.4 pg (ref 26.0–34.0)
MCHC: 30.7 g/dL (ref 30.0–36.0)
MCV: 95.6 fL (ref 78.0–100.0)
Platelets: 128 10*3/uL — ABNORMAL LOW (ref 150–400)
RBC: 3.44 MIL/uL — AB (ref 3.87–5.11)
RDW: 16.9 % — ABNORMAL HIGH (ref 11.5–15.5)
WBC: 11.9 10*3/uL — AB (ref 4.0–10.5)

## 2017-08-12 NOTE — Evaluation (Signed)
Occupational Therapy Evaluation Patient Details Name: [name] MRN: [id number] DOB: Jul 16, 1919 Today's Date: 08/12/2017    History of Present Illness [name] is a 84 y.o. female with PHMx: dementia since 2004, HTN, hyperlipidemia, diastolic CHF with EF 40-45%, COPD, history of spinal stenosis, CAD status post and STEMI in December 2014, CKD, presented to the emergency department with 2-3 weeks history of worsening altered mental status, failure to thrive, poor oral intake, weight loss. Found to have hypernatremia, AKI, Leukocytosis,Lactic acidosis , hyperkalemia, and possible UTi.   Clinical Impression   This 84 yo female admitted with above presents to acute OT with decreased visual attention to right, decreased bed mobility, and decreased transfers all affecting her ability to be able to A with her basic ADLs. She will benefit from acute OT with follow up HHOT v. SNF depending on A at home.    Follow Up Recommendations  Home health OT;Supervision/Assistance - 24 hour;Other (comment)(If 24 hour A not available then SNF would be appropriate)    Equipment Recommendations  None recommended by OT       Precautions / Restrictions Precautions Precautions: Fall Restrictions Weight Bearing Restrictions: No      Mobility Bed Mobility Overal bed mobility: Needs Assistance Bed Mobility: Rolling;Sidelying to Sit;Sit to Supine Rolling: Min assist Sidelying to sit: Min assist(for trunk)   Sit to supine: Min assist(for legs)   General bed mobility comments: Rolled to left with min A with only cues to roll to left but to roll to right I had to get her to attend to me visually before she rolled right with min A  Transfers Overall transfer level: Needs assistance Equipment used: 2 person hand held assist Transfers: Squat Pivot Transfers     Squat pivot transfers: Total assist;+2 physical assistance     General transfer comment: Pt with tendency of legs to slide out  from under her with sit>stand    Balance Overall balance assessment: Needs assistance Sitting-balance support: No upper extremity supported;Feet supported Sitting balance-Leahy Scale: Good     Standing balance support: Bilateral upper extremity supported Standing balance-Leahy Scale: Poor                             ADL either performed or assessed with clinical judgement   ADL Overall ADL's : Needs assistance/impaired Eating/Feeding: Maximal assistance;Bed level   Grooming: Wash/dry face;Set up;Supervision/safety;Bed level   Upper Body Bathing: Moderate assistance;Bed level   Lower Body Bathing: Total assistance;Bed level   Upper Body Dressing : Maximal assistance;Sitting   Lower Body Dressing: Total assistance;Bed level   Toilet Transfer: +2 for physical assistance;Total assistance;Squat-pivot Toilet Transfer Details (indicate cue type and reason): bed>recliner going to pt's right Toileting- Clothing Manipulation and Hygiene: Total assistance               Vision   Additional Comments: pt had a hard time visually attending on right side, but once I was able to get her to visually attend to me she was able to follow what I wanted her to do (ie: rolling right)            Pertinent Vitals/Pain Pain Assessment: Faces Faces Pain Scale: No hurt     Hand Dominance Right   Extremity/Trunk Assessment Upper Extremity Assessment Upper Extremity Assessment: Generalized weakness   Lower Extremity Assessment Lower Extremity Assessment: Defer to PT evaluation       Communication Communication Communication: No difficulties  Cognition Arousal/Alertness: Awake/alert Behavior During Therapy: WFL for tasks assessed/performed Overall Cognitive Status: History of cognitive impairments - at baseline                                 General Comments: Only oriented to birth month, able to follow one step commands with increased time               Home Living Family/patient expects to be discharged to:: Private residence                                 Additional Comments: Per chart pt is from home, but no other home information in chart and no family in room during eval to know PLOF from ADL or mobility stand point               OT Problem List: Decreased strength;Impaired balance (sitting and/or standing);Decreased safety awareness;Decreased cognition;Impaired vision/perception      OT Treatment/Interventions:      OT Goals(Current goals can be found in the care plan section) Acute Rehab OT Goals Patient Stated Goal: verbalized OK to getting up, but did state goal independently OT Goal Formulation: With patient Time For Goal Achievement: 08/26/17 Potential to Achieve Goals: Fair  OT Frequency:             Co-evaluation PT/OT/SLP Co-Evaluation/Treatment: Yes(partial) Reason for Co-Treatment: For patient/therapist safety   OT goals addressed during session: ADL's and self-care;Strengthening/ROM      AM-PAC PT "6 Clicks" Daily Activity     Outcome Measure Help from another person eating meals?: A Lot Help from another person taking care of personal grooming?: A Lot Help from another person toileting, which includes using toliet, bedpan, or urinal?: Total Help from another person bathing (including washing, rinsing, drying)?: A Lot Help from another person to put on and taking off regular upper body clothing?: A Lot Help from another person to put on and taking off regular lower body clothing?: Total 6 Click Score: 10   End of Session Equipment Utilized During Treatment: Gait belt Nurse Communication: (called up to front desk to let them know pt needed purewick replaced, RN also came to room and I let her know as well)  Activity Tolerance: Patient tolerated treatment well Patient left: in bed;with call bell/phone within reach;with bed alarm set  OT Visit Diagnosis: Unsteadiness on feet  (R26.81);Other abnormalities of gait and mobility (R26.89);Muscle weakness (generalized) (M62.81);Low vision, both eyes (H54.2);Other symptoms and signs involving cognitive function                Time: 1610-9604 OT Time Calculation (min): 32 min Charges:  OT General Charges $OT Visit: 1 Visit OT Evaluation $OT Eval Moderate Complexity: [address], [place] [phone] 08/12/2017

## 2017-08-12 NOTE — Plan of Care (Signed)
  Safety: Ability to remain free from injury will improve 08/12/2017 0358 - Progressing by Luther Redourgott, Saaya Procell, RN   Education: Knowledge of General Education information will improve 08/12/2017 0358 - Progressing by Luther Redourgott, Berta Denson, RN   Skin Integrity: Risk for impaired skin integrity will decrease 08/12/2017 0358 - Progressing by Luther Redourgott, Serenidy Waltz, RN

## 2017-08-12 NOTE — Evaluation (Signed)
Clinical/Bedside Swallow Evaluation Patient Details  Name: Briana LuzGladys J Hubbard MRN: [id number] Date of Birth: 08/19/1919  Today's Date: 08/12/2017 Time: SLP Start Time (ACUTE ONLY): 1133 SLP Stop Time (ACUTE ONLY): 1145 SLP Time Calculation (min) (ACUTE ONLY): 12 min  Past Medical History:  Past Medical History:  Diagnosis Date  . CHF (congestive heart failure) (HCC)   . Constipation   . COPD (chronic obstructive pulmonary disease) (HCC)   . Dementia   . Frequency of urination   . Hypertension   . Incontinence   . Pneumonia   . Spinal stenosis    Past Surgical History:  Past Surgical History:  Procedure Laterality Date  . COLONOSCOPY  1991   HPI:  84 y.o. female Community residentwith medical history significant for dementia since 2004, hypertension, hyperlipidemia, diastolic CHF with EF 40-45%, COPD, history of spinal stenosis, CAD status post and STEMI in December 2014, CKD, prior C. difficile infection in the family 2016, blood to the emergency department with 2-3 weeks history of worsening altered mental status, failure to thrive, poor oral intake, weight loss. SLP evaluated during prior admission 07/01/14; dys 1, thin liquids recommended as pt did not appear to have the capacity to masticate.   Assessment / Plan / Recommendation Clinical Impression  Patient presents with immediate coughing with consecutive sips of thin liquids, suspect decreased airway protection due to suspected oral discoordination, premature spillage. No overt signs of aspiration seen with single sips via straw (pt's preferred method of intake). She requires assistance for spoonfuls of puree, soft chopped solid. Though she accepts few trials and declines regular solids, she does adequately masticate and clear soft solid. When limited to single straw sips, no overt signs of aspiration but mild oral holding noted. No family present to confirm baseline status or diet. Recommend dys 2, thin liquids; pt may use straws  but provide full assistance and cues for single sips. Meds whole in puree. Will follow up for tolerance, consider advancement of solids if appropriate.    SLP Visit Diagnosis: Dysphagia, unspecified (R13.10)    Aspiration Risk  Mild aspiration risk    Diet Recommendation Dysphagia 2 (Fine chop);Thin liquid   Liquid Administration via: Cup;Straw Medication Administration: Whole meds with puree Supervision: Staff to assist with self feeding;Full supervision/cueing for compensatory strategies Compensations: Slow rate;Small sips/bites    Other  Recommendations Oral Care Recommendations: Oral care BID   Follow up Recommendations Other (comment)(tbd)      Frequency and Duration min 1 x/week  1 week       Prognosis Prognosis for Safe Diet Advancement: Fair Barriers to Reach Goals: Cognitive deficits      Swallow Study   General Date of Onset: 08/11/17 HPI: 84 y.o. female Community residentwith medical history significant for dementia since 2004, hypertension, hyperlipidemia, diastolic CHF with EF 40-45%, COPD, history of spinal stenosis, CAD status post and STEMI in December 2014, CKD, prior C. difficile infection in the family 2016, blood to the emergency department with 2-3 weeks history of worsening altered mental status, failure to thrive, poor oral intake, weight loss. SLP evaluated during prior admission 07/01/14; dys 1, thin liquids recommended as pt did not appear to have the capacity to masticate. Type of Study: Bedside Swallow Evaluation Previous Swallow Assessment: see HPI Diet Prior to this Study: Regular;Thin liquids Temperature Spikes Noted: No Respiratory Status: Room air History of Recent Intubation: No Behavior/Cognition: Alert;Cooperative Oral Cavity Assessment: Within Functional Limits Oral Care Completed by SLP: No Oral Cavity - Dentition: Missing dentition;Poor condition  Vision: Functional for self-feeding Self-Feeding Abilities: Needs assist Patient  Positioning: Upright in bed Baseline Vocal Quality: Normal Volitional Cough: Cognitively unable to elicit Volitional Swallow: Unable to elicit    Oral/Motor/Sensory Function Overall Oral Motor/Sensory Function: Within functional limits   Ice Chips Ice chips: Not tested   Thin Liquid Thin Liquid: Impaired Presentation: Cup;Straw;Self Fed Oral Phase Impairments: Poor awareness of bolus Oral Phase Functional Implications: Left anterior spillage;Prolonged oral transit Pharyngeal  Phase Impairments: Cough - Immediate(with consecutive swallows)    Nectar Thick Nectar Thick Liquid: Not tested   Honey Thick Honey Thick Liquid: Not tested   Puree Puree: Within functional limits Presentation: Spoon   Solid   GO   Solid: Within functional limits(soft solid) Presentation: Park Pope, MS, CCC-SLP Speech-Language Pathologist [phone] [name] 08/12/2017,12:22 PM

## 2017-08-12 NOTE — Evaluation (Signed)
Physical Therapy Evaluation Patient Details Name: [name] MRN: [id number] DOB: 06/15/1919 Today's Date: 08/12/2017   History of Present Illness  [name] is a 84 y.o. female with PHMx: dementia since 2004, HTN, hyperlipidemia, diastolic CHF with EF 40-45%, COPD, history of spinal stenosis, CAD status post and STEMI in December 2014, CKD, presented to the emergency department with 2-3 weeks history of worsening altered mental status, failure to thrive, poor oral intake, weight loss. Found to have hypernatremia, AKI, Leukocytosis,Lactic acidosis , hyperkalemia, and possible UTi.    Clinical Impression  Pt admitted with above diagnoses. Per chart, pt admitted from home. She does not appear to have been ambulatory as she has bil plantarflexion contractures. If pt is from home and family can continue to provide the necessary level of care, then she can return home and will not need HHPT unless she has had a decline in her functional status and family agrees to HHPT. If family cannot provide her needs (especially due to a decline in her status), then would recommend SNF. Pt currently with functional limitations due to the deficits listed below (see PT Problem List).  Pt may benefit from skilled PT to increase their independence and safety with mobility to allow discharge to the venue listed below.       Follow Up Recommendations Home health PT;Supervision/Assistance - 24 hour  (See above re: family capabilities)    Equipment Recommendations  None recommended by PT    Recommendations for Other Services       Precautions / Restrictions Precautions Precautions: Fall Restrictions Weight Bearing Restrictions: No      Mobility  Bed Mobility Overal bed mobility: Needs Assistance Bed Mobility: Rolling;Sidelying to Sit;Sit to Supine Rolling: Min assist Sidelying to sit: Min assist(for trunk)   Sit to supine: Min assist(for legs)   General bed mobility comments: Rolled to  left with min A with only cues to roll to left but to roll to right I had to get her to attend to me visually before she rolled right with min A  Transfers Overall transfer level: Needs assistance Equipment used: 2 person hand held assist Transfers: Squat Pivot Transfers     Squat pivot transfers: Total assist;+2 physical assistance     General transfer comment: Pt with tendency of legs to slide out from under her with sit>stand (due to limited knee flexion and DF unable to get feet under herself)  Ambulation/Gait                Stairs            Wheelchair Mobility    Modified Rankin (Stroke Patients Only)       Balance Overall balance assessment: Needs assistance Sitting-balance support: No upper extremity supported;Feet supported Sitting balance-Leahy Scale: Good     Standing balance support: Bilateral upper extremity supported Standing balance-Leahy Scale: Poor                               Pertinent Vitals/Pain Pain Assessment: Faces Faces Pain Scale: No hurt    Home Living Family/patient expects to be discharged to:: Private residence                 Additional Comments: Per chart pt is from home, but no other home information in chart and no family in room during eval to know PLOF from ADL or mobility stand point    Prior Function  Hand Dominance   Dominant Hand: Right    Extremity/Trunk Assessment   Upper Extremity Assessment Upper Extremity Assessment: Defer to OT evaluation    Lower Extremity Assessment Lower Extremity Assessment: Generalized weakness;RLE deficits/detail;LLE deficits/detail RLE Deficits / Details: ankle DF contracture 20 degrees from neutral; knee 5-100 degrees flexion LLE Deficits / Details: ankle DF contracture 40 degrees from neutral; knee flexion 5-90 degrees       Communication   Communication: No difficulties  Cognition Arousal/Alertness: Awake/alert Behavior During  Therapy: WFL for tasks assessed/performed Overall Cognitive Status: History of cognitive impairments - at baseline                                 General Comments: Only oriented to birth month, able to follow one step commands with increased time      General Comments      Exercises     Assessment/Plan    PT Assessment Patient needs continued PT services  PT Problem List Decreased strength;Decreased range of motion;Decreased balance;Decreased mobility;Decreased cognition       PT Treatment Interventions DME instruction;Functional mobility training;Therapeutic activities;Therapeutic exercise;Balance training;Cognitive remediation;Patient/family education    PT Goals (Current goals can be found in the Care Plan section)  Acute Rehab PT Goals Patient Stated Goal: verbalized OK to getting up, but did not state goal independently PT Goal Formulation: Patient unable to participate in goal setting Time For Goal Achievement: 08/26/17 Potential to Achieve Goals: Fair    Frequency Min 2X/week   Barriers to discharge        Co-evaluation PT/OT/SLP Co-Evaluation/Treatment: Yes Reason for Co-Treatment: For patient/therapist safety PT goals addressed during session: Mobility/safety with mobility OT goals addressed during session: ADL's and self-care;Strengthening/ROM       AM-PAC PT "6 Clicks" Daily Activity  Outcome Measure Difficulty turning over in bed (including adjusting bedclothes, sheets and blankets)?: A Little Difficulty moving from lying on back to sitting on the side of the bed? : A Little Difficulty sitting down on and standing up from a chair with arms (e.g., wheelchair, bedside commode, etc,.)?: A Lot Help needed moving to and from a bed to chair (including a wheelchair)?: A Lot Help needed walking in hospital room?: Total Help needed climbing 3-5 steps with a railing? : Total 6 Click Score: 12    End of Session   Activity Tolerance: Patient  tolerated treatment well Patient left: in bed;with call bell/phone within reach Nurse Communication: Other (comment)(needs pure-wick replaced ) PT Visit Diagnosis: Adult, failure to thrive (R62.7);Muscle weakness (generalized) (M62.81)    Time: 1610-9604 PT Time Calculation (min) (ACUTE ONLY): 26 min   Charges:   PT Evaluation $PT Eval Low Complexity: 1 Low     PT G Codes:          [employer], PT 08/12/2017, 4:40 PM

## 2017-08-12 NOTE — Progress Notes (Signed)
Hospitalist progress note   [name]  ZOX:[id number] DOB: 07/13/1919 DOA: 08/11/2017 PCP: Kirt Boys, DO   Specialists:    Brief Narrative:  84 y.o. female Community residentwith medical history significant for dementia since 2004, hypertension, hyperlipidemia, diastolic CHF with EF 40-45%, COPD, history of spinal stenosis, CAD status post and STEMI in December 2014, CKD, prior C. difficile infection in the family 2016, blood to the emergency department with 2-3 weeks history of worsening altered mental status, failure to thrive, poor oral intake, weight loss    Assessment & Plan:   Assessment:  The primary encounter diagnosis was Hypernatremia. Diagnoses of Altered mental status, unspecified altered mental status type, AKI (acute kidney injury) (HCC), Leukocytosis, unspecified type, and Lactic acidosis were also pertinent to this visit.  Hypernatremia-continue iv d5 for now-sodium 160--153 and improving, check labs daily only--if able to eat and drink will start free water  ? Uti-await cult-continue Rocephin 1 g q 24 and foll cult to guide tx  Hyperkalemia 5.8 on admit-resolving now, currently lower at 3.4  AKI baseline creatinine 1-1.6--was 1.9 on admit--cont d5.  Resolving to 1.7 currently labs am  Chr Diast HF-basleine weight 100lb--holding diuretics and imdur--ptient unable to take at present--trial PO food and liquid today and will decide on meds going forward  ORal candidiasis-cont nystatin  Dementia-if able and mor eoriented, resume aricept and namnda--could use exelon patch  DVT prophylaxis: lovenoex  Code Status:   Full at present-discussed with family for a long time  On admit  Family Communication:    none Disposition Plan: ip   Consultants:     Procedures:     Antimicrobials:   Rocephin  Nystatin   Subjective:  More awake alert then prior Tells me is hungry No cp Cannot orient to place  But can to person  Objective: Vitals:   08/11/17  2149 08/11/17 2159 08/11/17 2241 08/12/17 0444  BP: 108/82  (!) 84/50 (!) 110/57  Pulse:   73 91  Resp: (!) 26  (!) 21 20  Temp:   98.7 F (37.1 C) 97.9 F (36.6 C)  TempSrc:   Axillary Axillary  SpO2:  (!) 83% (!) 86% 94%  Weight:   45.2 kg (99 lb 10.4 oz)   Height:   4\' 10"  (1.473 m)     Intake/Output Summary (Last 24 hours) at 08/12/2017 0829 Last data filed at 08/12/2017 0544 Gross per 24 hour  Intake 1638.33 ml  Output 0 ml  Net 1638.33 ml   Filed Weights   08/11/17 1435 08/11/17 2241  Weight: 45.4 kg (100 lb) 45.2 kg (99 lb 10.4 oz)    Examination: Frail cachectic Mouth not examined cta b no added sound abd soft nt nd no rebound no guard No le edema   Data Reviewed: I have personally reviewed following labs and imaging studies  CBC: Recent Labs  Lab 08/10/17 1055 08/11/17 1435 08/11/17 1542 08/12/17 0040  WBC 17.1* 13.8*  --  11.9*  NEUTROABS 14,313* 11.5*  --   --   HGB 12.1 11.8* 11.6* 10.1*  HCT 35.5 37.7 34.0* 32.9*  MCV 88.3 95.0  --  95.6  PLT 162 154  --  128*   Basic Metabolic Panel: Recent Labs  Lab 08/10/17 1055 08/11/17 1435 08/11/17 1542 08/11/17 1810 08/12/17 0130  NA 159* 154* 160* 154* 153*  K 3.8 5.2* 3.8 3.8 3.4*  CL 120* 118* 119* 118* 118*  CO2 29 24  --  20* 23  GLUCOSE 94 120*  118* 100* 150*  BUN 38* 50* 51* 47* 44*  CREATININE 1.72* 1.96* 1.80* 1.78* 1.70*  CALCIUM 9.4 8.6*  --  8.5* 8.3*   GFR: Estimated Creatinine Clearance: 12.5 mL/min (A) (by C-G formula based on SCr of 1.7 mg/dL (H)). Liver Function Tests: Recent Labs  Lab 08/10/17 1055 08/11/17 1435  AST 17 54*  ALT 11 14  ALKPHOS  --  59  BILITOT 0.5 1.4*  PROT 7.0 6.9  ALBUMIN  --  3.1*   No results for input(s): LIPASE, AMYLASE in the last 168 hours. No results for input(s): AMMONIA in the last 168 hours. Coagulation Profile: Recent Labs  Lab 08/11/17 1530  INR 1.26   Cardiac Enzymes: Recent Labs  Lab 08/11/17 1809  TROPONINI 0.17*    CBG: No results for input(s): GLUCAP in the last 168 hours. Urine analysis:    Component Value Date/Time   COLORURINE YELLOW 08/11/2017 1435   APPEARANCEUR HAZY (A) 08/11/2017 1435   LABSPEC 1.017 08/11/2017 1435   PHURINE 5.0 08/11/2017 1435   GLUCOSEU NEGATIVE 08/11/2017 1435   HGBUR NEGATIVE 08/11/2017 1435   BILIRUBINUR NEGATIVE 08/11/2017 1435   KETONESUR NEGATIVE 08/11/2017 1435   PROTEINUR NEGATIVE 08/11/2017 1435   UROBILINOGEN 0.2 07/02/2014 1028   NITRITE NEGATIVE 08/11/2017 1435   LEUKOCYTESUR NEGATIVE 08/11/2017 1435     Radiology Studies: Reviewed images personally in health database    Scheduled Meds: . donepezil  10 mg Oral Daily  . heparin  5,000 Units Subcutaneous Q8H  . memantine  28 mg Oral Daily  . nystatin   Topical BID   Continuous Infusions: . cefTRIAXone (ROCEPHIN)  IV Stopped (08/11/17 1944)  . dextrose 50 mL/hr at 08/11/17 2022     LOS: 1 day    Time spent: 78    Pleas Koch, MD [facility]   If 7PM-7AM, please contact night-coverage [email or web address] Password [facility] 08/12/2017, 8:29 AM

## 2017-08-12 NOTE — Progress Notes (Signed)
Pt is alert but disorientedx4. Pt is able to follow simple instructions. Speech is incomprehensible. Bilat upper extremities are contracted, but pt extends BUE on command. Pt has healed presure ulcer on sacrum. See Epic for additional skin assessment. Pt VS taken, placed on tele, and oriented to room.

## 2017-08-13 LAB — RENAL FUNCTION PANEL
Albumin: 2.5 g/dL — ABNORMAL LOW (ref 3.5–5.0)
Anion gap: 11 (ref 5–15)
BUN: 30 mg/dL — AB (ref 6–20)
CALCIUM: 8 mg/dL — AB (ref 8.9–10.3)
CO2: 24 mmol/L (ref 22–32)
Chloride: 109 mmol/L (ref 101–111)
Creatinine, Ser: 1.33 mg/dL — ABNORMAL HIGH (ref 0.44–1.00)
GFR calc Af Amer: 38 mL/min — ABNORMAL LOW (ref >60)
GFR calc non Af Amer: 33 mL/min — ABNORMAL LOW (ref >60)
GLUCOSE: 83 mg/dL (ref 65–99)
Phosphorus: 3 mg/dL (ref 2.5–4.6)
Potassium: 3.4 mmol/L — ABNORMAL LOW (ref 3.5–5.1)
SODIUM: 144 mmol/L (ref 135–145)

## 2017-08-13 MED ORDER — CLOPIDOGREL BISULFATE 75 MG PO TABS
75.0000 mg | ORAL_TABLET | Freq: Every day | ORAL | Status: DC
  Administered 2017-08-13: 75 mg via ORAL
  Filled 2017-08-13: qty 1

## 2017-08-13 MED ORDER — NYSTATIN 100000 UNIT/ML MT SUSP: 5.0000 mL | Freq: Four times a day (QID) | OROMUCOSAL | 0 refills | Status: AC

## 2017-08-13 MED ORDER — ISOSORBIDE MONONITRATE ER 30 MG PO TB24
30.0000 mg | ORAL_TABLET | Freq: Every day | ORAL | Status: DC
  Administered 2017-08-13: 30 mg via ORAL
  Filled 2017-08-13: qty 1

## 2017-08-13 MED ORDER — NYSTATIN 100000 UNIT/GM EX POWD: Freq: Two times a day (BID) | CUTANEOUS | 0 refills | Status: DC

## 2017-08-13 MED ORDER — METOPROLOL TARTRATE 12.5 MG HALF TABLET
12.5000 mg | ORAL_TABLET | Freq: Every day | ORAL | Status: DC
  Administered 2017-08-13: 12.5 mg via ORAL
  Filled 2017-08-13: qty 1

## 2017-08-13 MED ORDER — NYSTATIN 100000 UNIT/ML MT SUSP
5.0000 mL | Freq: Four times a day (QID) | OROMUCOSAL | Status: DC
  Administered 2017-08-13 (×3): 500000 [IU] via ORAL
  Filled 2017-08-13 (×3): qty 5

## 2017-08-13 NOTE — Care Management Note (Signed)
Case Management Note  Patient Details  Name: [name] MRN: [id number] Date of Birth: 08/12/1919  Subjective/Objective:    Admitted for Urinary Tract Infection and Dehydration.          Action/Plan: Prior to admission patient home with daughter.  Called Daughter "Adela LankJacqueline", patient information provided.  Discussed recommendation of HH and daughter states "she wants Short term Rehab".  NCM advised daughter Melina Modena(POA) that LCSW "Erie NoeVanessa" will speak with her regarding SNF placement.  Expected Discharge Date:  08/13/17               Expected Discharge Plan:  Skilled Nursing Facility  Discharge planning Services   CM Consult  Status of Service:  In process, will continue to follow  Yancey FlemingsKimberly R Becton, RN  Nurse case manager [place] 08/13/2017, 10:12 AM

## 2017-08-13 NOTE — NC FL2 (Signed)
[place] MEDICAID FL2 LEVEL OF CARE SCREENING TOOL     IDENTIFICATION  Patient Name: Anyra Kedzior Parrack Birthdate: [birth year] Sex: female Admission Date (Current Location): 08/11/2017  [facility] and [place] Number:  Producer, television/film/video and Address:  [facility] [place], [address], [place], [place] [address]      Provider Number: [id number]  Attending Physician Name and Address:  Rhetta Mura, MD  Relative Name and Phone Number:  Miguel Dibble - daughter; [phone]    Current Level of Care: Hospital Recommended Level of Care: Skilled Nursing Facility Prior Approval Number:    Date Approved/Denied:   PASRR Number: [id number](Eff. 03/25/08)  Discharge Plan: SNF    Current Diagnoses: Patient Active Problem List   Diagnosis Date Noted  . Essential hypertension, benign 08/11/2017  . High risk medication use 08/11/2017  . Acute confusional state 08/11/2017  . Acute hypernatremia 08/11/2017  . Hyperkalemia 08/11/2017  . UTI (urinary tract infection) 08/11/2017  . Clostridium difficile diarrhea 07/08/2014  . Pressure ulcer of foot, stage 1 07/08/2014  . CAD (coronary artery disease) 07/08/2014  . Acute renal failure (HCC) 06/30/2014  . Failure to thrive in adult 06/30/2014  . Dementia 06/29/2014  . Edema 06/29/2014  . CHF (congestive heart failure) (HCC) 06/29/2014  . UI (urinary incontinence) 06/04/2014  . Spinal stenosis of lumbar region 10/22/2013  . Chronic combined systolic and diastolic heart failure (HCC) 08/13/2013  . Hypertensive heart disease 08/13/2013  . Urinary incontinence due to cognitive impairment 08/13/2013  . Vitamin D insufficiency 08/13/2013  . Hypocalcemia 05/27/2013  . ACS (acute coronary syndrome) (HCC) 04/25/2013  . Anxiety state 04/22/2013  . Depressive disorder, not elsewhere classified 04/22/2013  . COPD (chronic obstructive pulmonary disease) with emphysema (HCC) 04/22/2013  . Hallucinations 04/22/2013   . Abnormality of gait 04/22/2013  . Mixed Alzheimer's and vascular dementia 01/22/2013  . Routine general medical examination at a health care facility 01/22/2013  . Chronic kidney disease (CKD), stage II (mild) 01/22/2013  . Dyslipidemia 01/22/2013  . Cartilage disorder 12/24/2012  . Constipation     Orientation RESPIRATION BLADDER Height & Weight     Self  Normal Continent, External catheter(Placed 08/11/17) Weight: 99 lb 6.8 oz (45.1 kg) Height:  4\' 10"  (147.3 cm)  BEHAVIORAL SYMPTOMS/MOOD NEUROLOGICAL BOWEL NUTRITION STATUS      Continent Diet(Chopped DYS 2 diet)  AMBULATORY STATUS COMMUNICATION OF NEEDS Skin   Total Care(Patient was unable to ambulate with PT during evaluation) Verbally Normal                       Personal Care Assistance Level of Assistance  Bathing, Feeding, Dressing Bathing Assistance: Maximum assistance Feeding assistance: Maximum assistance Dressing Assistance: Maximum assistance     Functional Limitations Info  Sight, Hearing, Speech Sight Info: Adequate Hearing Info: Adequate Speech Info: Adequate    SPECIAL CARE FACTORS FREQUENCY  PT (By licensed PT), OT (By licensed OT)     PT Frequency: Evaluated 3/17 and a minimum of 5 days a week therapy needed at discharge OT Frequency: Evaluated 3/17 and a minimum of 5 days a week therapy needed at discharge            Contractures Contractures Info: Not present    Additional Factors Info  Code Status, Allergies, Insulin Sliding Scale Code Status Info: Full Allergies Info: No known allergies           Current Medications (08/13/2017):  This is the current hospital  active medication list Current Facility-Administered Medications  Medication Dose Route Frequency Provider Last Rate Last Dose  . acetaminophen (TYLENOL) tablet 650 mg  650 mg Oral Q6H PRN Marcos Eke, PA-C       Or  . acetaminophen (TYLENOL) suppository 650 mg  650 mg Rectal Q6H PRN Marcos Eke, PA-C      .  bisacodyl (DULCOLAX) suppository 10 mg  10 mg Rectal Daily PRN Marlowe Kays E, PA-C      . clopidogrel (PLAVIX) tablet 75 mg  75 mg Oral Daily Rhetta Mura, MD   75 mg at 08/13/17 1005  . dextrose 5 % solution   Intravenous Continuous Marcos Eke, PA-C 50 mL/hr at 08/11/17 2022    . heparin injection 5,000 Units  5,000 Units Subcutaneous Q8H Marcos Eke, PA-C   5,000 Units at 08/13/17 0555  . isosorbide mononitrate (IMDUR) 24 hr tablet 30 mg  30 mg Oral Daily Rhetta Mura, MD   30 mg at 08/13/17 1005  . memantine (NAMENDA XR) 24 hr capsule 28 mg  28 mg Oral Daily Marcos Eke, PA-C   28 mg at 08/13/17 1006  . metoprolol tartrate (LOPRESSOR) injection 5 mg  5 mg Intravenous Q6H PRN Marlowe Kays E, PA-C      . metoprolol tartrate (LOPRESSOR) tablet 12.5 mg  12.5 mg Oral Daily Rhetta Mura, MD   12.5 mg at 08/13/17 1005  . nystatin (MYCOSTATIN) 100000 UNIT/ML suspension 500,000 Units  5 mL Oral QID Rhetta Mura, MD      . nystatin (MYCOSTATIN/NYSTOP) topical powder   Topical BID Dong, Robin, DO      . ondansetron (ZOFRAN) tablet 4 mg  4 mg Oral Q6H PRN Marcos Eke, PA-C       Or  . ondansetron (ZOFRAN) injection 4 mg  4 mg Intravenous Q6H PRN Marcos Eke, PA-C         Discharge Medications: Please see discharge summary for a list of discharge medications.  Relevant Imaging Results:  Relevant Lab Results:   Additional Information ss#[id number].  Cristobal Goldmann, LCSW

## 2017-08-13 NOTE — Progress Notes (Signed)
[facility] No signs and symptoms of distress noted. Awaiting [facility] transfer.

## 2017-08-13 NOTE — Clinical Social Work Note (Signed)
Clinical Social Work Assessment  Patient Details  Name: [name] MRN: [id number] Date of Birth: 05/01/1920  Date of referral:  08/13/17               Reason for consult:  Facility Placement, Discharge Planning                Permission sought to share information with:  Family Supports Permission granted to share information::  No(Patient oriented to person only)  Name::     Briana DibbleJacqueline Hubbard  Agency::     Relationship::  Daughter  Contact Information:  [phone]  Housing/Transportation Living arrangements for the past 2 months:  Single Family Home Source of Information:  Adult Children(Daughter Briana LankJacqueline) Patient Interpreter Needed:  None Criminal Activity/Legal Involvement Pertinent to Current Situation/Hospitalization:  No - Comment as needed Significant Relationships:  Adult Children Lives with:  Adult Children Do you feel safe going back to the place where you live?  No(Daughter in agreement with ST rehab for her mother before returning home) Need for family participation in patient care:  Yes (Comment)  Care giving concerns: Daughter expressed agreement with ST rehab for her mother. Ms. [name] acknowledged her mother's weakened state and agreed that rehab should help her become stronger.  Social Worker assessment / plan: CSW talked with patient's daughter by phone regarding discharge disposition and recommendation of ST rehab. Ms. [name] reported that her mother lives with her and has been to ST rehab before - [facility]([place]). When asked, daughter was expressed satisfaction with care and reported that it is near her home. Daughter requested that her mother return there if possible. Ms. [name] indicated that her mother has lived with her 2 years and she is the primary caregiver. Daughter also reported that she is now retired but was working for a period of time while he mother was with her. Daughter added that caring for her mother has affected her  health.  Employment status:  Retired Database administratornsurance information:  Managed Medicare, Managed Care PT Recommendations:  Skilled Nursing Facility, Home with Home Health(Home with HH and 24/hr. supervision/assistance or SNF) Information / Referral to community resources:  Other (Comment Required)(Daughter provided CSW with facility preference)  Patient/Family's Response to care:  Daughter expressed no concerns regarding patient's care during hospitalization.  Patient/Family's Understanding of and Emotional Response to Diagnosis, Current Treatment, and Prognosis:  Ms. [name] expressed understanding of her mother's health issues and agreed with the need for ST rehab.  Emotional Assessment Appearance:  Other (Comment Required(Did not visit with patient, talked with daughter by phone) Attitude/Demeanor/Rapport:  Unable to Assess Affect (typically observed):  Unable to Assess Orientation:  Oriented to Self Alcohol / Substance use:  Tobacco Use, Alcohol Use, Illicit Drugs(Per H&P, patient quit smoking and does not drink or use illicit drugs) Psych involvement (Current and /or in the community):  No (Comment)  Discharge Needs  Concerns to be addressed:  Discharge Planning Concerns Readmission within the last 30 days:  No Current discharge risk:  None Barriers to Discharge:  No Barriers Identified   [name], Briana Hjort Bradley, LCSW 08/13/2017, 2:35 PM

## 2017-08-13 NOTE — Clinical Social Work Placement (Addendum)
CLINICAL SOCIAL WORK PLACEMENT  NOTE 08/13/17 - DISCHARGED TO [facility].  Date:  08/13/2017  Patient Details  Name: [name] MRN: [id number] Date of Birth: December 10, 1919  Clinical Social Work is seeking post-discharge placement for this patient at the Skilled  Nursing Facility level of care (*CSW will initial, date and re-position this form in  chart as items are completed):  No(Daughter provided CSW with facility preference)   Patient/family provided with [facility] Clinical Social Work Department's list of facilities offering this level of care within the geographic area requested by the patient (or if unable, by the patient's family).  Yes   Patient/family informed of their freedom to choose among providers that offer the needed level of care, that participate in Medicare, Medicaid or managed care program needed by the patient, have an available bed and are willing to accept the patient.  No   Patient/family informed of [facility]'s ownership interest in  [facility] and [facility], as well as of the fact that they are under no obligation to receive care at these facilities.  PASRR submitted to EDS on       PASRR number received on       Existing PASRR number confirmed on 08/13/17     FL2 transmitted to all facilities in geographic area requested by pt/family on 08/13/17     FL2 transmitted to all facilities within larger geographic area on       Patient informed that his/her managed care company has contracts with or will negotiate with certain facilities, including the following:        Yes   Patient/family informed of bed offers received.  Patient chooses bed at  [facility]   Physician recommends and patient chooses bed at      Patient to be transferred to  [place] on 08/13/17.  Patient to be transferred to facility by Ambulance     Patient family notified on 08/13/17 of transfer.  Name of  family member notified:  Daughter, Miguel Dibble - [phone]     PHYSICIAN      Additional Comment:  08/13/17 - CSW informed by Valarie Cones, Director of Clinical Transitions for [facility] at 2:20 pm that insurance authorization received. Patient will be in room 127B at the skilled facility.   _______________________________________________ Cristobal Goldmann, LCSW 08/13/2017, 2:45 PM

## 2017-08-13 NOTE — Progress Notes (Signed)
Speech Language Pathology Treatment: Dysphagia  Patient Details Name: [name] MRN: [id number] DOB: [birth year] Today's Date: 08/13/2017 Time: 9562-1308 SLP Time Calculation (min) (ACUTE ONLY): 12 min  Assessment / Plan / Recommendation Clinical Impression  Pt continues to demonstrate mild cough if large sips attempted. Reviewed important strategies of small sips and self feeding for best control of rate. Pt verbalized understanding and return demonstrated with success.   HPI HPI: 84 y.o. female Community residentwith medical history significant for dementia since 2004, hypertension, hyperlipidemia, diastolic CHF with EF 40-45%, COPD, history of spinal stenosis, CAD status post and STEMI in December 2014, CKD, prior C. difficile infection in the family 2016, blood to the emergency department with 2-3 weeks history of worsening altered mental status, failure to thrive, poor oral intake, weight loss. SLP evaluated during prior admission 07/01/14; dys 1, thin liquids recommended as pt did not appear to have the capacity to masticate.      SLP Plan  Continue with current plan of care       Recommendations  Diet recommendations: Dysphagia 2 (fine chop);Thin liquid Liquids provided via: Cup;Straw Medication Administration: Whole meds with puree Supervision: Patient able to self feed Compensations: Slow rate;Small sips/bites Postural Changes and/or Swallow Maneuvers: Seated upright 90 degrees                Oral Care Recommendations: Oral care BID Follow up Recommendations: Skilled Nursing facility SLP Visit Diagnosis: Dysphagia, unspecified (R13.10) Plan: Continue with current plan of care       GO               [facility], [place] CCC-SLP [phone]  Claudine Mouton 08/13/2017, 3:04 PM

## 2017-08-13 NOTE — Discharge Summary (Signed)
Physician Discharge Summary  [name] UJW:[id number] DOB: 08/10/1919 DOA: 08/11/2017  PCP: Kirt Boysarter, Monica, DO  Admit date: 08/11/2017 Discharge date: 08/13/2017  Time spent: 36 minutes  Recommendations for Outpatient Follow-up:  1. Stop lasix 2. Get bemt 1 week 3. Complete Nystatin swish swallow 10 days form d/c 4. recommend OP goals of car eif this recurs--family doesn't seem to understand concept  Discharge Diagnoses:  Principal Problem:   Acute confusional state Active Problems:   Mixed Alzheimer's and vascular dementia   Chronic kidney disease (CKD), stage II (mild)   Dyslipidemia   Anxiety state   COPD (chronic obstructive pulmonary disease) with emphysema (HCC)   Chronic combined systolic and diastolic heart failure (HCC)   Urinary incontinence due to cognitive impairment   Vitamin D insufficiency   Failure to thrive in adult   Essential hypertension, benign   Acute hypernatremia   Hyperkalemia   UTI (urinary tract infection)   Discharge Condition: gaurded  Diet recommendation: chopped dys 2  Filed Weights   08/11/17 1435 08/11/17 2241 08/12/17 2049  Weight: 45.4 kg (100 lb) 45.2 kg (99 lb 10.4 oz) 45.1 kg (99 lb 6.8 oz)    History of present illness:  84 y.o.femaleCommunity residentwith medical history significant fordementia since 2004, hypertension, hyperlipidemia, diastolic CHF with EF 40-45%, COPD, history of spinal stenosis, CAD status post and STEMI in December 2014, CKD, prior C. difficile infection in the family 2016, blood to the emergency department with 2-3 weeks history of worsening altered mental status, failure to thrive, poor oral intake, weight loss     Hospital Course:  Hypernatremia-continue iv d5 for now-sodium 160--15-->144 at d/c recommend offering water when able at SNF  ? Uti-await cult-continue Rocephin 1 g q 24 --cult was neg--d/c Ceftriaxone--no furthe rtherapy  Hyperkalemia 5.8 on admit-resolving now, currently lower  at 3.4  AKI baseline creatinine 1-1.6--was 1.9 on admit--cont d5.  Resolving to 1.7 currently labs am  Chr Diast HF-basleine weight 100lb--held permanently lasix on-going given poor po intake May need readdressing as OP  ORal candidiasis-cont nystatin--complete 10 days form d/c qid dosing  Dementia-if able and mor eoriented, resume aricept and namenda-is moderate at baseline bvut mentation much improve don d/c    Procedures:  none   Consultations:  none  Discharge Exam: Vitals:   08/12/17 2049 08/13/17 0620  BP: 113/63 (!) 127/59  Pulse: 69 75  Resp: 19 20  Temp: 98.5 F (36.9 C) 98.4 F (36.9 C)  SpO2: 100% 100%    General: awake alert more oriented no distress eating and drinking some Cardiovascular: s1 s 2 Respiratory: clear abd soft Some bruising to UE where IV in place Neuro much improved more coherent  Discharge Instructions   Discharge Instructions    Diet - low sodium heart healthy   Complete by:  As directed    Increase activity slowly   Complete by:  As directed      Allergies as of 08/13/2017   No Known Allergies     Medication List    STOP taking these medications   cholecalciferol 1000 units tablet Commonly known as:  VITAMIN D   furosemide 20 MG tablet Commonly known as:  LASIX   VESICARE 5 MG tablet Generic drug:  solifenacin     TAKE these medications   albuterol (2.5 MG/3ML) 0.083% nebulizer solution Commonly known as:  PROVENTIL Take 3 mLs (2.5 mg total) by nebulization every 6 (six) hours as needed for wheezing or shortness of breath.  clopidogrel 75 MG tablet Commonly known as:  PLAVIX TAKE 1 TABLET (75 MG TOTAL) BY MOUTH DAILY.   donepezil 10 MG tablet Commonly known as:  ARICEPT TAKE 1 TABLET (10 MG TOTAL) BY MOUTH DAILY.   isosorbide mononitrate 30 MG 24 hr tablet Commonly known as:  IMDUR TAKE 1 TABLET (30 MG TOTAL) BY MOUTH DAILY.   memantine 28 MG Cp24 24 hr capsule Commonly known as:  NAMENDA XR Take  1 capsule (28 mg total) by mouth daily.   metoprolol tartrate 25 MG tablet Commonly known as:  LOPRESSOR Take 0.5 tablets (12.5 mg total) by mouth daily. For blood pressure and heart   nystatin 100000 UNIT/ML suspension Commonly known as:  MYCOSTATIN Take 5 mLs (500,000 Units total) by mouth 4 (four) times daily for 10 days.   nystatin powder Commonly known as:  MYCOSTATIN/NYSTOP Apply topically 2 (two) times daily.      No Known Allergies Follow-up Information    Kirt Boys, DO. Call.   Specialty:  Internal Medicine Contact information: [address] [place] [address] [phone]            The results of significant diagnostics from this hospitalization (including imaging, microbiology, ancillary and laboratory) are listed below for reference.    Significant Diagnostic Studies: Dg Chest 2 View  Result Date: 08/11/2017 CLINICAL DATA:  Worsening white blood cell count. EXAM: CHEST - 2 VIEW COMPARISON:  Chest radiograph 06/29/2014. FINDINGS: Monitoring leads overlie the patient. Stable cardiac and mediastinal contours. Bilateral mid lower lung peripheral linear opacities. No large area pulmonary consolidation. No pleural effusion or pneumothorax. IMPRESSION: No acute cardiopulmonary process. Peripheral linear opacities favored to represent scarring. Electronically Signed   By: Annia Belt M.D.   On: 08/11/2017 15:53    Microbiology: Recent Results (from the past 240 hour(s))  Culture, blood (Routine x 2)     Status: None (Preliminary result)   Collection Time: 08/11/17  1:00 PM  Result Value Ref Range Status   Specimen Description BLOOD LEFT ANTECUBITAL  Final   Special Requests   Final    BOTTLES DRAWN AEROBIC AND ANAEROBIC Blood Culture adequate volume   Culture   Final    NO GROWTH 1 DAY Performed at [facility], [address], [place], [place] [address]    Report Status PENDING  Incomplete  Culture, blood (Routine x 2)     Status: None  (Preliminary result)   Collection Time: 08/11/17  2:35 PM  Result Value Ref Range Status   Specimen Description BLOOD RIGHT ANTECUBITAL  Final   Special Requests   Final    BOTTLES DRAWN AEROBIC AND ANAEROBIC Blood Culture adequate volume   Culture   Final    NO GROWTH 1 DAY Performed at [facility], [address], [place], [place] [address]    Report Status PENDING  Incomplete  Urine culture     Status: None   Collection Time: 08/11/17  5:19 PM  Result Value Ref Range Status   Specimen Description URINE, CATHETERIZED  Final   Special Requests NONE  Final   Culture   Final    NO GROWTH Performed at [facility], [address], [place], [place] [address]    Report Status 08/12/2017 FINAL  Final     Labs: Basic Metabolic Panel: Recent Labs  Lab 08/11/17 1435 08/11/17 1542 08/11/17 1810 08/12/17 0130 08/12/17 1001 08/13/17 0521  NA 154* 160* 154* 153* 147* 144  K 5.2* 3.8  3.8 3.4* 3.0* 3.4*  CL 118* 119* 118* 118* 113* 109  CO2 24  --  20* 23 24 24   GLUCOSE 120* 118* 100* 150* 89 83  BUN 50* 51* 47* 44* 38* 30*  CREATININE 1.96* 1.80* 1.78* 1.70* 1.54* 1.33*  CALCIUM 8.6*  --  8.5* 8.3* 8.1* 8.0*  PHOS  --   --   --   --   --  3.0   Liver Function Tests: Recent Labs  Lab 08/10/17 1055 08/11/17 1435 08/13/17 0521  AST 17 54*  --   ALT 11 14  --   ALKPHOS  --  59  --   BILITOT 0.5 1.4*  --   PROT 7.0 6.9  --   ALBUMIN  --  3.1* 2.5*   No results for input(s): LIPASE, AMYLASE in the last 168 hours. No results for input(s): AMMONIA in the last 168 hours. CBC: Recent Labs  Lab 08/10/17 1055 08/11/17 1435 08/11/17 1542 08/12/17 0040  WBC 17.1* 13.8*  --  11.9*  NEUTROABS 14,313* 11.5*  --   --   HGB 12.1 11.8* 11.6* 10.1*  HCT 35.5 37.7 34.0* 32.9*  MCV 88.3 95.0  --  95.6  PLT 162 154  --  128*   Cardiac Enzymes: Recent Labs  Lab 08/11/17 1809  TROPONINI 0.17*   BNP: BNP (last 3 results) Recent Labs    08/11/17 1511  BNP 387.1*     ProBNP (last 3 results) No results for input(s): PROBNP in the last 8760 hours.  CBG: No results for input(s): GLUCAP in the last 168 hours.     Signed:  Rhetta Mura MD   [facility] 08/13/2017, 9:47 AM

## 2017-08-14 ENCOUNTER — Encounter: Payer: Self-pay | Admitting: Adult Health

## 2017-08-14 ENCOUNTER — Non-Acute Institutional Stay (SKILLED_NURSING_FACILITY): Payer: Medicare Other | Admitting: Adult Health

## 2017-08-14 DIAGNOSIS — F0280 Dementia in other diseases classified elsewhere without behavioral disturbance: Secondary | ICD-10-CM

## 2017-08-14 DIAGNOSIS — I25119 Atherosclerotic heart disease of native coronary artery with unspecified angina pectoris: Secondary | ICD-10-CM | POA: Diagnosis not present

## 2017-08-14 DIAGNOSIS — J439 Emphysema, unspecified: Secondary | ICD-10-CM | POA: Diagnosis not present

## 2017-08-14 DIAGNOSIS — F0150 Vascular dementia without behavioral disturbance: Secondary | ICD-10-CM | POA: Diagnosis not present

## 2017-08-14 DIAGNOSIS — I5042 Chronic combined systolic (congestive) and diastolic (congestive) heart failure: Secondary | ICD-10-CM

## 2017-08-14 DIAGNOSIS — R627 Adult failure to thrive: Secondary | ICD-10-CM

## 2017-08-14 DIAGNOSIS — N182 Chronic kidney disease, stage 2 (mild): Secondary | ICD-10-CM | POA: Diagnosis not present

## 2017-08-14 DIAGNOSIS — G309 Alzheimer's disease, unspecified: Secondary | ICD-10-CM

## 2017-08-14 NOTE — Progress Notes (Signed)
Location:   [facility] Room Number: 127 A Place of Service:  SNF (31)   CODE STATUS: Full Code  No Known Allergies  Chief Complaint  Patient presents with  . Hospitalization Follow-up    Hospital follow up    HPI:  She is a 84 year old woman who has been hospitalized for hypernatremia; hyperkalemia; which were treated with ivf and resolved. She did not have an infectious process present. Her lasix was stopped in the hospital for her heart failure. She is here for short term rehab. At this time; this more than likely represents a long term placement for her. She is unable to participate in the hpi or ros. There are no reports of appetite issues; no complaints of pain; no reports of behavioral issues. She will continue to be followed for her chronic illnesses including: hypertension; heart failure; and dementia. There are no nursing concerns at this time.    Past Medical History:  Diagnosis Date  . CHF (congestive heart failure) (HCC)   . Constipation   . COPD (chronic obstructive pulmonary disease) (HCC)   . Dementia   . Frequency of urination   . Hypertension   . Incontinence   . Pneumonia   . Spinal stenosis     Past Surgical History:  Procedure Laterality Date  . COLONOSCOPY  1991    Social History   Socioeconomic History  . Marital status: Widowed    Spouse name: Not on file  . Number of children: Not on file  . Years of education: Not on file  . Highest education level: Not on file  Social Needs  . Financial resource strain: Not on file  . Food insecurity - worry: Not on file  . Food insecurity - inability: Not on file  . Transportation needs - medical: Not on file  . Transportation needs - non-medical: Not on file  Occupational History  . Not on file  Tobacco Use  . Smoking status: Former Smoker    Last attempt to quit: 09/27/1978    Years since quitting: 38.9  . Smokeless tobacco: Never Used  Substance and Sexual Activity  . Alcohol  use: No  . Drug use: No  . Sexual activity: Not on file  Other Topics Concern  . Not on file  Social History Narrative  . Not on file   Family History  Problem Relation Age of Onset  . Cancer Brother   . Hypertension Daughter   . Cancer Son       VITAL SIGNS BP 124/76   Pulse 66   Temp 98 F (36.7 C)   Resp 18   Ht 4\' 10"  (1.473 m) Comment: from hospital  Wt 99 lb 10 oz (45.2 kg) Comment: from hospital  SpO2 98%   BMI 20.82 kg/m   Outpatient Encounter Medications as of 08/14/2017  Medication Sig  . albuterol (PROVENTIL) (2.5 MG/3ML) 0.083% nebulizer solution Take 3 mLs (2.5 mg total) by nebulization every 6 (six) hours as needed for wheezing or shortness of breath.  . clopidogrel (PLAVIX) 75 MG tablet TAKE 1 TABLET (75 MG TOTAL) BY MOUTH DAILY.  Marland Kitchen donepezil (ARICEPT) 10 MG tablet TAKE 1 TABLET (10 MG TOTAL) BY MOUTH DAILY.  . isosorbide mononitrate (IMDUR) 30 MG 24 hr tablet TAKE 1 TABLET (30 MG TOTAL) BY MOUTH DAILY.  . memantine (NAMENDA XR) 28 MG CP24 24 hr capsule Take 1 capsule (28 mg total) by mouth daily.  . metoprolol tartrate (LOPRESSOR) 25 MG  tablet Take 0.5 tablets (12.5 mg total) by mouth daily. For blood pressure and heart  . nystatin (MYCOSTATIN) 100000 UNIT/ML suspension Take 5 mLs (500,000 Units total) by mouth 4 (four) times daily for 10 days.  Marland Kitchen. nystatin (MYCOSTATIN/NYSTOP) powder Apply topically 2 (two) times daily.   No facility-administered encounter medications on file as of 08/14/2017.      SIGNIFICANT DIAGNOSTIC EXAMS  TODAY:   08-11-17: chest x-ray: No acute cardiopulmonary process. Peripheral linear opacities favored to represent scarring.   LABS REVIEWED TODAY:   08-10-17: wbc 17.1; hgb 12.1; hct 35.5; mcv 88.3; plt 162; glucose 94; bun 38; creat 1.72; k+ 3.8; na++ 159; ca 9.4; liver normal albumin 3.5; tsh 2.08 08-11-17: wbc 13.8; hgb 11.8; hct 37.7; mcv 95.0; plt 154; glucose 120; bun 50; creat 1.96; k+ 5.2; na++ 154; ca 8.6; ast 54; total  bili 1.4; albumin 3.1; BNP 387.1; blood culture: no growth; urine culture: no growth 08-13-17:glcuose 83; bun 30; creat 1.33; k+ 3.4; na++ 144; ca 8.0; phos 3.0; albumin 2.5    Review of Systems  Unable to perform ROS: Dementia (confused )    Physical Exam  Constitutional: She appears well-developed and well-nourished. No distress.  thin  Neck: No thyromegaly present.  Cardiovascular: Normal rate, regular rhythm and intact distal pulses.  Murmur heard. 2/6 Pedal pulses faint   Pulmonary/Chest: Effort normal and breath sounds normal. No respiratory distress.  Abdominal: Soft. Bowel sounds are normal. She exhibits no distension. There is no tenderness.  Musculoskeletal: She exhibits no edema.  Is able to move all extremities   Lymphadenopathy:    She has no cervical adenopathy.  Neurological: She is alert.  Skin: Skin is warm and dry. She is not diaphoretic.  Psychiatric: She has a normal mood and affect.     ASSESSMENT/ PLAN:  TODAY;   1. Chronic combined systolic and diastolic heart failure: EF 40-45% (04-28-13): is stable her lasix was stopped in the hospital due to renal failure and poor po intake. Will continue lopressor 12.5 mg daily and will monitor   2.  CAD: is status post ACS: is stable will continue plavix 75 mg daily and Imdur 30 mg daily   3. Hypertensive heart disease with heart failure: stable b/p: 124/76: will continue lopressor 12.5 mg daily   4. Mixed Alzheimer's and vascular dementia: without change in status; weight is 99 pounds will continue namenda xr 28 mg daily and aricept 10 mg daily will monitor  5. COPD: with emphysema: stable will continue albuterol neb treatment every 6 hours as needed   6. Chronic kidney disease stage II: without change bun 30; creat 1.33  7. Failure to thrive in adult: weight is 99 pounds; albumin 2.5; will continue supplements per facility protocol   Will check cbc cmp    MD is aware of resident's narcotic use and is in  agreement with current plan of care. We will attempt to wean resident as apropriate   Synthia Innocenteborah Jarreau Callanan NP [facility]  Contact [phone] Monday through Friday 8am- 5pm  After hours call [phone]

## 2017-08-15 ENCOUNTER — Telehealth: Payer: Self-pay

## 2017-08-15 LAB — CBC AND DIFFERENTIAL
HCT: 28 — AB (ref 36–46)
HEMOGLOBIN: 9.6 — AB (ref 12.0–16.0)
Neutrophils Absolute: 7
PLATELETS: 150 (ref 150–399)
WBC: 10.1

## 2017-08-15 LAB — BASIC METABOLIC PANEL
BUN: 28 — AB (ref 4–21)
CREATININE: 1.3 — AB (ref 0.5–1.1)
GLUCOSE: 81
POTASSIUM: 3.8 (ref 3.4–5.3)
Sodium: 142 (ref 137–147)

## 2017-08-15 LAB — HEPATIC FUNCTION PANEL
ALT: 10 (ref 7–35)
AST: 20 (ref 13–35)
Alkaline Phosphatase: 57 (ref 25–125)
Bilirubin, Total: 0.4

## 2017-08-15 NOTE — Telephone Encounter (Signed)
Possible re-admission to facility. This is a [facility] were [facility] is needed if patient was re-admitted to facility upon discharge. [facility]

## 2017-08-16 ENCOUNTER — Encounter: Payer: Self-pay | Admitting: Internal Medicine

## 2017-08-16 ENCOUNTER — Non-Acute Institutional Stay (SKILLED_NURSING_FACILITY): Payer: Medicare Other | Admitting: Internal Medicine

## 2017-08-16 DIAGNOSIS — R627 Adult failure to thrive: Secondary | ICD-10-CM | POA: Diagnosis not present

## 2017-08-16 DIAGNOSIS — I25119 Atherosclerotic heart disease of native coronary artery with unspecified angina pectoris: Secondary | ICD-10-CM | POA: Diagnosis not present

## 2017-08-16 DIAGNOSIS — G309 Alzheimer's disease, unspecified: Secondary | ICD-10-CM | POA: Diagnosis not present

## 2017-08-16 DIAGNOSIS — F0150 Vascular dementia without behavioral disturbance: Secondary | ICD-10-CM

## 2017-08-16 DIAGNOSIS — F0280 Dementia in other diseases classified elsewhere without behavioral disturbance: Secondary | ICD-10-CM

## 2017-08-16 DIAGNOSIS — J439 Emphysema, unspecified: Secondary | ICD-10-CM | POA: Diagnosis not present

## 2017-08-16 DIAGNOSIS — B370 Candidal stomatitis: Secondary | ICD-10-CM | POA: Diagnosis not present

## 2017-08-16 DIAGNOSIS — R5381 Other malaise: Secondary | ICD-10-CM | POA: Diagnosis not present

## 2017-08-16 DIAGNOSIS — I5042 Chronic combined systolic (congestive) and diastolic (congestive) heart failure: Secondary | ICD-10-CM | POA: Diagnosis not present

## 2017-08-16 DIAGNOSIS — R634 Abnormal weight loss: Secondary | ICD-10-CM

## 2017-08-16 DIAGNOSIS — N182 Chronic kidney disease, stage 2 (mild): Secondary | ICD-10-CM

## 2017-08-16 DIAGNOSIS — E878 Other disorders of electrolyte and fluid balance, not elsewhere classified: Secondary | ICD-10-CM

## 2017-08-16 LAB — CULTURE, BLOOD (ROUTINE X 2)
CULTURE: NO GROWTH
CULTURE: NO GROWTH
Special Requests: ADEQUATE
Special Requests: ADEQUATE

## 2017-08-16 NOTE — Progress Notes (Signed)
Patient ID: [name], female   DOB: 26-Mar-1920, 84 y.o.   MRN: [id number]  Provider:  DR Elmon Kirschner Location:  [facility] Room Number: 127 A Place of Service:  SNF (31)  PCP: Kirt Boys, DO Patient Care Team: Kirt Boys, DO as PCP - General (Internal Medicine)  Extended Emergency Contact Information Primary Emergency Contact: Cheatham,Jaqueline Address: [address]          [place], [place] [address] [place] Home Phone: [phone] Mobile Phone: [phone] Relation: Daughter Secondary Emergency Contact: love,crystal Mobile Phone: [phone] Relation: Grandaughter Code Status: DNR (as of 08/15/17) Goals of Care: Advanced Directive information Advanced Directives 08/16/2017  Does Patient Have a Medical Advance Directive? No  Would patient like information on creating a medical advance directive? No - Patient declined  Pre-existing out of facility DNR order (yellow form or pink MOST form) -      Chief Complaint  Patient presents with  . New Admit To SNF    Admission    HPI: Patient is a 84 y.o. female seen today for admission to SNF following hospital stay for acute confusional state, acute hypernatremia, hyperkalemia, UTI, mixed Alzheimer's and vascular dementia. She was tx with IV rocephin for UTI. Oral candidiasis tx with nystatin. K 5.8-->3.4. Cr 1.7-->1.33; albumin 2.5 at d/c. She presents to SNF for short term rehab with potential for long term care.  Today she reports no concerns. No nursing issues. She is tolerating tx. No falls. She is a poor historian due to dementia. Hx obtained from chart. SNF labs reviewed: Hgb 9.6; albumin 2.7; Cr 1.34  Chronic combined systolic and diastolic heart failure - EF 40-45% (04-28-13) - stable on lopressor 12.5 mg daily; off lasix 2/2 AKI   CAD - stable on plavix 75 mg daily and Imdur 30 mg daily. No recent CP  HTN - BP stable on lopressor 12.5 mg daily   Mixed Alzheimer's and  vascular dementia - unchanged. Wt is 99 lbs. She takes namenda xr 28 mg daily and aricept 10 mg daily. Albumin 2.7. She gets nutritional supplements  COPD - she has emphysema. Stable on albuterol neb treatment every 6 hours as needed   CKD - stage 3. Cr 1.34. She has chronic anemia with Hgb 9.6  Failure to thrive - current wt 99 lbs; albumin 2.7. She gets nutritional supplements per facility protocol.    Past Medical History:  Diagnosis Date  . CHF (congestive heart failure) (HCC)   . Constipation   . COPD (chronic obstructive pulmonary disease) (HCC)   . Dementia   . Frequency of urination   . Hypertension   . Incontinence   . Pneumonia   . Spinal stenosis    Past Surgical History:  Procedure Laterality Date  . COLONOSCOPY  1991    reports that she quit smoking about 38 years ago. She has never used smokeless tobacco. She reports that she does not drink alcohol or use drugs. Social History   Socioeconomic History  . Marital status: Widowed    Spouse name: Not on file  . Number of children: Not on file  . Years of education: Not on file  . Highest education level: Not on file  Occupational History  . Not on file  Social Needs  . Financial resource strain: Not on file  . Food insecurity:    Worry: Not on file    Inability: Not on file  . Transportation needs:    Medical: Not  on file    Non-medical: Not on file  Tobacco Use  . Smoking status: Former Smoker    Last attempt to quit: 09/27/1978    Years since quitting: 38.9  . Smokeless tobacco: Never Used  Substance and Sexual Activity  . Alcohol use: No  . Drug use: No  . Sexual activity: Not on file  Lifestyle  . Physical activity:    Days per week: Not on file    Minutes per session: Not on file  . Stress: Not on file  Relationships  . Social connections:    Talks on phone: Not on file    Gets together: Not on file    Attends religious service: Not on file    Active member of club or organization: Not on  file    Attends meetings of clubs or organizations: Not on file    Relationship status: Not on file  . Intimate partner violence:    Fear of current or ex partner: Not on file    Emotionally abused: Not on file    Physically abused: Not on file    Forced sexual activity: Not on file  Other Topics Concern  . Not on file  Social History Narrative  . Not on file    Functional Status Survey:    Family History  Problem Relation Age of Onset  . Cancer Brother   . Hypertension Daughter   . Cancer Son     Health Maintenance  Topic Date Due  . INFLUENZA VACCINE  08/15/2018 (Originally 12/27/2016)  . DEXA SCAN  08/15/2018 (Originally 07/29/1986)  . TETANUS/TDAP  08/15/2018 (Originally 07/28/1940)  . PNA vac Low Risk Adult (2 of 2 - PCV13) 08/15/2018 (Originally 05/29/1993)    No Known Allergies  Outpatient Encounter Medications as of 08/16/2017  Medication Sig  . albuterol (PROVENTIL) (2.5 MG/3ML) 0.083% nebulizer solution Take 3 mLs (2.5 mg total) by nebulization every 6 (six) hours as needed for wheezing or shortness of breath.  . clopidogrel (PLAVIX) 75 MG tablet TAKE 1 TABLET (75 MG TOTAL) BY MOUTH DAILY.  Marland Kitchen donepezil (ARICEPT) 10 MG tablet TAKE 1 TABLET (10 MG TOTAL) BY MOUTH DAILY.  . isosorbide mononitrate (IMDUR) 30 MG 24 hr tablet TAKE 1 TABLET (30 MG TOTAL) BY MOUTH DAILY.  . memantine (NAMENDA XR) 28 MG CP24 24 hr capsule Take 1 capsule (28 mg total) by mouth daily.  . metoprolol tartrate (LOPRESSOR) 25 MG tablet Take 0.5 tablets (12.5 mg total) by mouth daily. For blood pressure and heart  . Nutritional Supplements (NUTRITIONAL SUPPLEMENT PO) NAS (No Salt Added) diet - Mechanical Soft Texture.  Nectar thickened Fluids consistency  . nystatin (MYCOSTATIN) 100000 UNIT/ML suspension Take 5 mLs (500,000 Units total) by mouth 4 (four) times daily for 10 days.  Marland Kitchen nystatin (MYCOSTATIN/NYSTOP) powder Apply topically 2 (two) times daily.   No facility-administered encounter medications  on file as of 08/16/2017.     Review of Systems  Unable to perform ROS: Dementia    Vitals:   08/16/17 0911  BP: 110/60  Pulse: (!) 58  Resp: 18  Temp: 97.8 F (36.6 C)  SpO2: 98%  Weight: 99 lb 10 oz (45.2 kg)  Height: 4\' 10"  (1.473 m)   Body mass index is 20.82 kg/m. Physical Exam  Constitutional: She appears well-developed.  Frail appearing in NAD, sitting on edge of bed  HENT:  Mouth/Throat: Oropharynx is clear and moist. No oropharyngeal exudate.  Improved oral thrush; MMM  Eyes: Pupils are equal,  round, and reactive to light. No scleral icterus.  Neck: Neck supple. Carotid bruit is not present. No tracheal deviation present. No thyromegaly present.  Cardiovascular: Normal rate, regular rhythm and intact distal pulses. Exam reveals no gallop and no friction rub.  Murmur (1/6 SEM) heard. No LE edema b/l. no calf TTP.   Pulmonary/Chest: Effort normal. No stridor. No respiratory distress. She has no wheezes. She has rales (b/l inspiratory rale).  Abdominal: Soft. Normal appearance and bowel sounds are normal. She exhibits no distension and no mass. There is no hepatomegaly. There is no tenderness. There is no rigidity, no rebound and no guarding. No hernia.  Musculoskeletal: She exhibits edema.  Lymphadenopathy:    She has no cervical adenopathy.  Neurological: She is alert.  Skin: Skin is warm and dry. No rash noted.  Psychiatric: She has a normal mood and affect. Her behavior is normal. She is noncommunicative.    Labs reviewed: Basic Metabolic Panel: Recent Labs    08/12/17 0130 08/12/17 1001 08/13/17 0521 08/15/17  NA 153* 147* 144 142  K 3.4* 3.0* 3.4* 3.8  CL 118* 113* 109  --   CO2 23 24 24   --   GLUCOSE 150* 89 83  --   BUN 44* 38* 30* 28*  CREATININE 1.70* 1.54* 1.33* 1.3*  CALCIUM 8.3* 8.1* 8.0*  --   PHOS  --   --  3.0  --    Liver Function Tests: Recent Labs    08/10/17 1055 08/11/17 1435 08/13/17 0521 08/15/17  AST 17 54*  --  20  ALT 11  14  --  10  ALKPHOS  --  59  --  57  BILITOT 0.5 1.4*  --   --   PROT 7.0 6.9  --   --   ALBUMIN  --  3.1* 2.5*  --    No results for input(s): LIPASE, AMYLASE in the last 8760 hours. No results for input(s): AMMONIA in the last 8760 hours. CBC: Recent Labs    08/10/17 1055 08/11/17 1435 08/11/17 1542 08/12/17 0040 08/15/17  WBC 17.1* 13.8*  --  11.9* 10.1  NEUTROABS 14,313* 11.5*  --   --  7  HGB 12.1 11.8* 11.6* 10.1* 9.6*  HCT 35.5 37.7 34.0* 32.9* 28*  MCV 88.3 95.0  --  95.6  --   PLT 162 154  --  128* 150   Cardiac Enzymes: Recent Labs    08/11/17 1809  TROPONINI 0.17*   BNP: Invalid input(s): POCBNP Lab Results  Component Value Date   HGBA1C 5.5 06/30/2014   Lab Results  Component Value Date   TSH 2.08 08/10/2017   Lab Results  Component Value Date   VITAMINB12 1,796 (H) 06/30/2014   Lab Results  Component Value Date   FOLATE >20.0 06/30/2014   Lab Results  Component Value Date   IRON 52 06/30/2014   TIBC 252 06/30/2014   FERRITIN 96 06/30/2014    Imaging and Procedures obtained prior to SNF admission: Dg Chest 2 View  Result Date: 08/11/2017 CLINICAL DATA:  Worsening white blood cell count. EXAM: CHEST - 2 VIEW COMPARISON:  Chest radiograph 06/29/2014. FINDINGS: Monitoring leads overlie the patient. Stable cardiac and mediastinal contours. Bilateral mid lower lung peripheral linear opacities. No large area pulmonary consolidation. No pleural effusion or pneumothorax. IMPRESSION: No acute cardiopulmonary process. Peripheral linear opacities favored to represent scarring. Electronically Signed   By: Annia Belt M.D.   On: 08/11/2017 15:53    Assessment/Plan  ICD-10-CM   1. Physical deconditioning R53.81   2. Weight loss R63.4   3. Failure to thrive in adult R62.7   4. Mixed Alzheimer's and vascular dementia G30.9    F01.50    F02.80   5. Oral candidiasis B37.0    resolving  6. Chronic kidney disease (CKD), stage II (mild) N18.2   7. Chronic  combined systolic and diastolic heart failure (HCC) I50.42   8. Coronary artery disease involving native coronary artery of native heart with angina pectoris (HCC) I25.119   9. Pulmonary emphysema, unspecified emphysema type (HCC) J43.9   10. Electrolyte disturbance E87.8     Cont current meds as ordered  PT/OT/ST as ordered  Cont nutritional supplements as ordered  GOAL: short term rehab with potential for long tern care. Communicated with pt and nursing.  Labs/tests ordered: none    Sakai Wolford S. Ancil Linsey  [facility] [address] [place], [place] [address] [phone] Cell (Monday-Friday 8 AM - 5 PM) [phone] After 5 PM and follow prompts

## 2017-08-23 ENCOUNTER — Encounter: Payer: Self-pay | Admitting: Adult Health

## 2017-08-23 ENCOUNTER — Non-Acute Institutional Stay (SKILLED_NURSING_FACILITY): Payer: Medicare Other | Admitting: Adult Health

## 2017-08-23 DIAGNOSIS — I5042 Chronic combined systolic (congestive) and diastolic (congestive) heart failure: Secondary | ICD-10-CM | POA: Diagnosis not present

## 2017-08-23 DIAGNOSIS — R627 Adult failure to thrive: Secondary | ICD-10-CM

## 2017-08-23 DIAGNOSIS — I110 Hypertensive heart disease with heart failure: Secondary | ICD-10-CM | POA: Diagnosis not present

## 2017-08-23 DIAGNOSIS — I5022 Chronic systolic (congestive) heart failure: Secondary | ICD-10-CM

## 2017-08-23 DIAGNOSIS — F0280 Dementia in other diseases classified elsewhere without behavioral disturbance: Secondary | ICD-10-CM

## 2017-08-23 DIAGNOSIS — G309 Alzheimer's disease, unspecified: Secondary | ICD-10-CM | POA: Diagnosis not present

## 2017-08-23 DIAGNOSIS — F0150 Vascular dementia without behavioral disturbance: Secondary | ICD-10-CM

## 2017-08-23 NOTE — Progress Notes (Signed)
Location:   [facility] Room Number: 127 A Place of Service:  SNF (31)   CODE STATUS: DNR  No Known Allergies  Chief Complaint  Patient presents with  . Acute Visit    Care Plan Meeting    HPI:  We have come together for her care plan meeting. Her family is present; she is unable to participate in the meeting. She has been seen by palliative care and is now a DNR. She is being seen by therapy for pt/ot/st. She remains on nectar thick liquids. The goal of her care is for long term placement. There are no reports of behavioral issues; no changes in appetite; no reports of pain present. There are no nursing concerns at this time.    Past Medical History:  Diagnosis Date  . CHF (congestive heart failure) (HCC)   . Constipation   . COPD (chronic obstructive pulmonary disease) (HCC)   . Dementia   . Frequency of urination   . Hypertension   . Incontinence   . Pneumonia   . Spinal stenosis     Past Surgical History:  Procedure Laterality Date  . COLONOSCOPY  1991    Social History   Socioeconomic History  . Marital status: Widowed    Spouse name: Not on file  . Number of children: Not on file  . Years of education: Not on file  . Highest education level: Not on file  Occupational History  . Not on file  Social Needs  . Financial resource strain: Not on file  . Food insecurity:    Worry: Not on file    Inability: Not on file  . Transportation needs:    Medical: Not on file    Non-medical: Not on file  Tobacco Use  . Smoking status: Former Smoker    Last attempt to quit: 09/27/1978    Years since quitting: 38.9  . Smokeless tobacco: Never Used  Substance and Sexual Activity  . Alcohol use: No  . Drug use: No  . Sexual activity: Not on file  Lifestyle  . Physical activity:    Days per week: Not on file    Minutes per session: Not on file  . Stress: Not on file  Relationships  . Social connections:    Talks on phone: Not on file    Gets  together: Not on file    Attends religious service: Not on file    Active member of club or organization: Not on file    Attends meetings of clubs or organizations: Not on file    Relationship status: Not on file  . Intimate partner violence:    Fear of current or ex partner: Not on file    Emotionally abused: Not on file    Physically abused: Not on file    Forced sexual activity: Not on file  Other Topics Concern  . Not on file  Social History Narrative  . Not on file   Family History  Problem Relation Age of Onset  . Cancer Brother   . Hypertension Daughter   . Cancer Son       VITAL SIGNS BP 126/77   Pulse 76   Temp 98.1 F (36.7 C)   Resp 20   Ht 4\' 10"  (1.473 m)   Wt 98 lb 4.8 oz (44.6 kg)   SpO2 97%   BMI 20.54 kg/m   Outpatient Encounter Medications as of 08/23/2017  Medication Sig  . albuterol (PROVENTIL) (2.5 MG/3ML)  0.083% nebulizer solution Take 3 mLs (2.5 mg total) by nebulization every 6 (six) hours as needed for wheezing or shortness of breath.  . clopidogrel (PLAVIX) 75 MG tablet TAKE 1 TABLET (75 MG TOTAL) BY MOUTH DAILY.  Marland Kitchen. donepezil (ARICEPT) 10 MG tablet TAKE 1 TABLET (10 MG TOTAL) BY MOUTH DAILY.  . isosorbide mononitrate (IMDUR) 30 MG 24 hr tablet TAKE 1 TABLET (30 MG TOTAL) BY MOUTH DAILY.  . memantine (NAMENDA XR) 28 MG CP24 24 hr capsule Take 1 capsule (28 mg total) by mouth daily.  . metoprolol tartrate (LOPRESSOR) 25 MG tablet Take 0.5 tablets (12.5 mg total) by mouth daily. For blood pressure and heart  . Multiple Vitamin (MULTIVITAMIN) tablet Take 1 tablet by mouth daily.  . Nutritional Supplements (NUTRITIONAL SUPPLEMENT PO) NAS (No Salt Added) diet - Mechanical Soft Texture.  Nectar thickened Fluids consistency, add fortified foods  . nystatin (MYCOSTATIN) 100000 UNIT/ML suspension Take 5 mLs (500,000 Units total) by mouth 4 (four) times daily for 10 days.  Marland Kitchen. nystatin (MYCOSTATIN/NYSTOP) powder Apply topically 2 (two) times daily.   No  facility-administered encounter medications on file as of 08/23/2017.      SIGNIFICANT DIAGNOSTIC EXAMS  PREVIOUS:   08-11-17: chest x-ray: No acute cardiopulmonary process. Peripheral linear opacities favored to represent scarring.  NO NEW EXAMS.    LABS REVIEWED TODAY:   08-10-17: wbc 17.1; hgb 12.1; hct 35.5; mcv 88.3; plt 162; glucose 94; bun 38; creat 1.72; k+ 3.8; na++ 159; ca 9.4; liver normal albumin 3.5; tsh 2.08 08-11-17: wbc 13.8; hgb 11.8; hct 37.7; mcv 95.0; plt 154; glucose 120; bun 50; creat 1.96; k+ 5.2; na++ 154; ca 8.6; ast 54; total bili 1.4; albumin 3.1; BNP 387.1; blood culture: no growth; urine culture: no growth 08-13-17:glcuose 83; bun 30; creat 1.33; k+ 3.4; na++ 144; ca 8.0; phos 3.0; albumin 2.5   NO NEW LABS.    Review of Systems  Unable to perform ROS: Dementia (confused )    Physical Exam  Constitutional: She appears well-developed and well-nourished. No distress.  Thin   Neck: No thyromegaly present.  Cardiovascular: Normal rate, regular rhythm and intact distal pulses.  Murmur heard. 2/6  Pulmonary/Chest: Effort normal and breath sounds normal. No respiratory distress.  Abdominal: Soft. Bowel sounds are normal. She exhibits no distension. There is no tenderness.  Musculoskeletal: She exhibits no edema.  Is able to move all extremities   Lymphadenopathy:    She has no cervical adenopathy.  Neurological: She is alert.  Skin: Skin is warm and dry. She is not diaphoretic.  Psychiatric: She has a normal mood and affect.    ASSESSMENT/ PLAN:  TODAY;   1. Chronic combined systolic and diastolic heart failure: EF 40-45% (04-28-13):  2. Hypertensive heart disease with heart failure:  3. Mixed Alzheimer's and vascular dementia:  4. Failure to thrive in adult:  Will continue her current plan of care Will continue current medication regimen Will continue therapy as directed Will continue to be seen by palliative care  Time spent with patient and  family: 30 minutes: discussed therapy expected outcomes; goals of care and medications. Verbalized understanding.     MD is aware of resident's narcotic use and is in agreement with current plan of care. We will attempt to wean resident as apropriate    Synthia Innocenteborah Khaleah Duer NP [facility]  Contact [phone] Monday through Friday 8am- 5pm  After hours call [phone]

## 2017-08-24 ENCOUNTER — Ambulatory Visit: Payer: Self-pay | Admitting: Internal Medicine

## 2017-09-13 ENCOUNTER — Encounter: Payer: Self-pay | Admitting: Adult Health

## 2017-09-13 ENCOUNTER — Non-Acute Institutional Stay (SKILLED_NURSING_FACILITY): Payer: Medicare Other | Admitting: Adult Health

## 2017-09-13 DIAGNOSIS — R627 Adult failure to thrive: Secondary | ICD-10-CM | POA: Diagnosis not present

## 2017-09-13 DIAGNOSIS — G309 Alzheimer's disease, unspecified: Secondary | ICD-10-CM

## 2017-09-13 DIAGNOSIS — F0150 Vascular dementia without behavioral disturbance: Secondary | ICD-10-CM

## 2017-09-13 DIAGNOSIS — F0280 Dementia in other diseases classified elsewhere without behavioral disturbance: Secondary | ICD-10-CM

## 2017-09-13 DIAGNOSIS — I5042 Chronic combined systolic (congestive) and diastolic (congestive) heart failure: Secondary | ICD-10-CM | POA: Diagnosis not present

## 2017-09-13 DIAGNOSIS — J439 Emphysema, unspecified: Secondary | ICD-10-CM | POA: Diagnosis not present

## 2017-09-13 NOTE — Progress Notes (Signed)
Location:   [facility] Room Number: 127 A Place of Service:  SNF (31)    CODE STATUS: DNR  No Known Allergies  Chief Complaint  Patient presents with  . Discharge Note    Discharging to home on 09/14/17    HPI:  She is being discharged to home with family. Her family as decided that they are able to meet her needs at home and have care arranged for. She will not need any dme. She will need home health for pt/ot//rn/cna/sw. She will need her prescriptions written and will need to follow up with her medical provider.  She had been hospitalized for hypernatremia. She was admitted to this facility for short term rehab. She is doing as well as she can at this time.    Past Medical History:  Diagnosis Date  . CHF (congestive heart failure) (HCC)   . Constipation   . COPD (chronic obstructive pulmonary disease) (HCC)   . Dementia   . Frequency of urination   . Hypertension   . Incontinence   . Pneumonia   . Spinal stenosis     Past Surgical History:  Procedure Laterality Date  . COLONOSCOPY  1991    Social History   Socioeconomic History  . Marital status: Widowed    Spouse name: Not on file  . Number of children: Not on file  . Years of education: Not on file  . Highest education level: Not on file  Occupational History  . Not on file  Social Needs  . Financial resource strain: Not on file  . Food insecurity:    Worry: Not on file    Inability: Not on file  . Transportation needs:    Medical: Not on file    Non-medical: Not on file  Tobacco Use  . Smoking status: Former Smoker    Last attempt to quit: 09/27/1978    Years since quitting: 38.9  . Smokeless tobacco: Never Used  Substance and Sexual Activity  . Alcohol use: No  . Drug use: No  . Sexual activity: Not on file  Lifestyle  . Physical activity:    Days per week: Not on file    Minutes per session: Not on file  . Stress: Not on file  Relationships  . Social connections:   Talks on phone: Not on file    Gets together: Not on file    Attends religious service: Not on file    Active member of club or organization: Not on file    Attends meetings of clubs or organizations: Not on file    Relationship status: Not on file  . Intimate partner violence:    Fear of current or ex partner: Not on file    Emotionally abused: Not on file    Physically abused: Not on file    Forced sexual activity: Not on file  Other Topics Concern  . Not on file  Social History Narrative  . Not on file   Family History  Problem Relation Age of Onset  . Cancer Brother   . Hypertension Daughter   . Cancer Son     VITAL SIGNS BP 120/76   Pulse 80   Temp 98.1 F (36.7 C)   Resp 16   Ht 4\' 10"  (1.473 m)   Wt 97 lb 1.6 oz (44 kg)   SpO2 98%   BMI 20.29 kg/m   Patient's Medications  New Prescriptions   No medications on file  Previous  Medications   ALBUTEROL (PROVENTIL) (2.5 MG/3ML) 0.083% NEBULIZER SOLUTION    Take 3 mLs (2.5 mg total) by nebulization every 6 (six) hours as needed for wheezing or shortness of breath.   CLOPIDOGREL (PLAVIX) 75 MG TABLET    TAKE 1 TABLET (75 MG TOTAL) BY MOUTH DAILY.   DONEPEZIL (ARICEPT) 10 MG TABLET    TAKE 1 TABLET (10 MG TOTAL) BY MOUTH DAILY.   ISOSORBIDE MONONITRATE (IMDUR) 30 MG 24 HR TABLET    TAKE 1 TABLET (30 MG TOTAL) BY MOUTH DAILY.   MEMANTINE (NAMENDA XR) 28 MG CP24 24 HR CAPSULE    Take 1 capsule (28 mg total) by mouth daily.   METOPROLOL TARTRATE (LOPRESSOR) 25 MG TABLET    Take 0.5 tablets (12.5 mg total) by mouth daily. For blood pressure and heart   MULTIPLE VITAMIN (MULTIVITAMIN) TABLET    Take 1 tablet by mouth daily.   NUTRITIONAL SUPPLEMENTS (NUTRITIONAL SUPPLEMENT PO)    NAS (No Salt Added) diet - Mechanical Soft Texture.  Nectar thickened Fluids consistency, add fortified foods   NYSTATIN (MYCOSTATIN/NYSTOP) POWDER    Apply topically 2 (two) times daily.  Modified Medications   No medications on file  Discontinued  Medications   No medications on file     SIGNIFICANT DIAGNOSTIC EXAMS  PREVIOUS:   08-11-17: chest x-ray: No acute cardiopulmonary process. Peripheral linear opacities favored to represent scarring.  NO NEW EXAMS.    LABS REVIEWED TODAY:   08-10-17: wbc 17.1; hgb 12.1; hct 35.5; mcv 88.3; plt 162; glucose 94; bun 38; creat 1.72; k+ 3.8; na++ 159; ca 9.4; liver normal albumin 3.5; tsh 2.08 08-11-17: wbc 13.8; hgb 11.8; hct 37.7; mcv 95.0; plt 154; glucose 120; bun 50; creat 1.96; k+ 5.2; na++ 154; ca 8.6; ast 54; total bili 1.4; albumin 3.1; BNP 387.1; blood culture: no growth; urine culture: no growth 08-13-17:glcuose 83; bun 30; creat 1.33; k+ 3.4; na++ 144; ca 8.0; phos 3.0; albumin 2.5   NO NEW LABS.    Review of Systems  Unable to perform ROS: Dementia (confused )   Physical Exam  Constitutional: She appears well-developed and well-nourished. No distress.  Thin   Neck: No thyromegaly present.  Cardiovascular: Normal rate, regular rhythm and intact distal pulses.  Murmur heard. 2/6  Pulmonary/Chest: Effort normal and breath sounds normal.  Abdominal: Soft. Bowel sounds are normal. She exhibits no distension. There is no tenderness.  Musculoskeletal: She exhibits no edema.  Able to move all extremities   Lymphadenopathy:    She has no cervical adenopathy.  Neurological: She is alert.  Skin: Skin is warm and dry. She is not diaphoretic.  Psychiatric: She has a normal mood and affect.     Physical Exam  Constitutional: She appears well-developed and well-nourished. No distress.  Thin   Neck: No thyromegaly present.  Cardiovascular: Normal rate, regular rhythm and intact distal pulses.  Murmur heard. 2/6  Pulmonary/Chest: Effort normal and breath sounds normal. No respiratory distress.  Abdominal: Soft. Bowel sounds are normal. She exhibits no distension. There is no tenderness.  Musculoskeletal: She exhibits no edema.  Is able to move all extremities     Lymphadenopathy:    She has no cervical adenopathy.  Neurological: She is alert.  Skin: Skin is warm and dry. She is not diaphoretic.  Psychiatric: She has a normal mood and affect.    ASSESSMENT/ PLAN:  Patient is being discharged with the following home health services:  Pt/ot/rn/cna/sw: to evaluate and treat as  indicated for gait balance strength balance adl training; medication management; adl care and community outreach   Patient is being discharged with the following durable medical equipment:  None needed   Patient has been advised to f/u with their PCP in 1-2 weeks to bring them up to date on their rehab stay.  Social services at facility was responsible for arranging this appointment.  Pt was provided with a 30 day supply of prescriptions for medications and refills must be obtained from their PCP.  For controlled substances, a more limited supply may be provided adequate until PCP appointment only.   A 30 day supply of prescription medications have been written per the list as above.    Time spent with patient/family: 35 minutes; expectations of home health medications; verbalized understanding.    Synthia Innocent NP [facility]  Contact [phone] Monday through Friday 8am- 5pm  After hours call [phone]

## 2017-09-16 DIAGNOSIS — I5042 Chronic combined systolic (congestive) and diastolic (congestive) heart failure: Secondary | ICD-10-CM | POA: Diagnosis not present

## 2017-09-16 DIAGNOSIS — I130 Hypertensive heart and chronic kidney disease with heart failure and stage 1 through stage 4 chronic kidney disease, or unspecified chronic kidney disease: Secondary | ICD-10-CM | POA: Diagnosis not present

## 2017-09-16 DIAGNOSIS — F419 Anxiety disorder, unspecified: Secondary | ICD-10-CM

## 2017-09-16 DIAGNOSIS — N182 Chronic kidney disease, stage 2 (mild): Secondary | ICD-10-CM | POA: Diagnosis not present

## 2017-09-16 DIAGNOSIS — J439 Emphysema, unspecified: Secondary | ICD-10-CM | POA: Diagnosis not present

## 2017-09-16 DIAGNOSIS — F0280 Dementia in other diseases classified elsewhere without behavioral disturbance: Secondary | ICD-10-CM | POA: Diagnosis not present

## 2017-09-16 DIAGNOSIS — G309 Alzheimer's disease, unspecified: Secondary | ICD-10-CM | POA: Diagnosis not present

## 2017-09-16 DIAGNOSIS — R627 Adult failure to thrive: Secondary | ICD-10-CM | POA: Diagnosis not present

## 2017-09-17 ENCOUNTER — Telehealth: Payer: Self-pay | Admitting: Internal Medicine

## 2017-09-17 NOTE — Telephone Encounter (Signed)
I spoke with the pt's daughter to schedule AWV-I.  She said she will keep the appt as is for now, but may need to call us back to reschedule since her mom has to be transported by ambulance. VDM (DD)

## 2017-09-20 ENCOUNTER — Telehealth: Payer: Self-pay

## 2017-09-20 NOTE — Telephone Encounter (Signed)
I left a message for Raiford NobleRick giving verbal order for twice weekly for 3 weeks for home health PT

## 2017-09-20 NOTE — Telephone Encounter (Signed)
Ok for PT verbal order

## 2017-09-20 NOTE — Telephone Encounter (Signed)
[facility] with [facility] called to request home health PT for patient.   Request was for home health PT twice a week for 3 weeks for balance training, therapeutic exercise, and stability education.   Please advise if ok to give verbal order for 2x weekly for 3 weeks for [facility].

## 2017-09-26 ENCOUNTER — Encounter: Payer: Self-pay | Admitting: Internal Medicine

## 2017-09-26 ENCOUNTER — Ambulatory Visit (INDEPENDENT_AMBULATORY_CARE_PROVIDER_SITE_OTHER): Payer: Medicare Other

## 2017-09-26 ENCOUNTER — Ambulatory Visit (INDEPENDENT_AMBULATORY_CARE_PROVIDER_SITE_OTHER): Payer: Medicare Other | Admitting: Internal Medicine

## 2017-09-26 VITALS — BP 86/58 | HR 75 | Temp 97.6°F

## 2017-09-26 VITALS — BP 84/52 | HR 75 | Temp 97.6°F | Ht <= 58 in

## 2017-09-26 DIAGNOSIS — G309 Alzheimer's disease, unspecified: Secondary | ICD-10-CM

## 2017-09-26 DIAGNOSIS — I959 Hypotension, unspecified: Secondary | ICD-10-CM

## 2017-09-26 DIAGNOSIS — R627 Adult failure to thrive: Secondary | ICD-10-CM | POA: Diagnosis not present

## 2017-09-26 DIAGNOSIS — S51812A Laceration without foreign body of left forearm, initial encounter: Secondary | ICD-10-CM | POA: Diagnosis not present

## 2017-09-26 DIAGNOSIS — F0150 Vascular dementia without behavioral disturbance: Secondary | ICD-10-CM

## 2017-09-26 DIAGNOSIS — F0280 Dementia in other diseases classified elsewhere without behavioral disturbance: Secondary | ICD-10-CM | POA: Diagnosis not present

## 2017-09-26 DIAGNOSIS — Z79899 Other long term (current) drug therapy: Secondary | ICD-10-CM

## 2017-09-26 DIAGNOSIS — Z0000 Encounter for general adult medical examination without abnormal findings: Secondary | ICD-10-CM

## 2017-09-26 DIAGNOSIS — J439 Emphysema, unspecified: Secondary | ICD-10-CM

## 2017-09-26 LAB — CBC
HCT: 28.9 % — ABNORMAL LOW (ref 35.0–45.0)
HEMOGLOBIN: 9.3 g/dL — AB (ref 11.7–15.5)
MCH: 29.2 pg (ref 27.0–33.0)
MCHC: 32.2 g/dL (ref 32.0–36.0)
MCV: 90.9 fL (ref 80.0–100.0)
MPV: 10.3 fL (ref 7.5–12.5)
Platelets: 190 10*3/uL (ref 140–400)
RBC: 3.18 10*6/uL — ABNORMAL LOW (ref 3.80–5.10)
RDW: 14.7 % (ref 11.0–15.0)
WBC: 11.3 10*3/uL — AB (ref 3.8–10.8)

## 2017-09-26 NOTE — Progress Notes (Signed)
Subjective:   CHIEKO NEISES is a 84 y.o. female who presents for an Initial Medicare Annual Wellness Visit; incapacitated patient unable to answer questions appropriately        Objective:    Today's Vitals   09/26/17 1057  BP: (!) 84/52  Pulse: 75  Temp: 97.6 F (36.4 C)  TempSrc: Oral  Height:  (1.473 m)   Body mass index is 20.29 kg/m.  Advanced Directives 09/26/2017 09/13/2017 08/23/2017 08/16/2017 08/16/2017 08/14/2017 06/21/2016  Does Patient Have a Medical Advance Directive? Yes Yes Yes Yes No No No  Type of Estate agent of [place];Living will;Out of facility DNR (pink MOST or yellow form) Out of facility DNR (pink MOST or yellow form) Out of facility DNR (pink MOST or yellow form) Out of facility DNR (pink MOST or yellow form) - - -  Does patient want to make changes to medical advance directive? No - Patient declined No - Patient declined No - Patient declined No - Patient declined - - -  Copy of Healthcare Power of Attorney in Chart? No - copy requested - - - - - -  Would patient like information on creating a medical advance directive? - No - Patient declined No - Patient declined No - Patient declined No - Patient declined No - Patient declined -  Pre-existing out of facility DNR order (yellow form or pink MOST form) Yellow form placed in chart (order not valid for inpatient use) Yellow form placed in chart (order not valid for inpatient use) Yellow form placed in chart (order not valid for inpatient use) Yellow form placed in chart (order not valid for inpatient use) - - -    Current Medications (verified) Outpatient Encounter Medications as of 09/26/2017  Medication Sig  . albuterol (PROVENTIL) (2.5 MG/3ML) 0.083% nebulizer solution Take 3 mLs (2.5 mg total) by nebulization every 6 (six) hours as needed for wheezing or shortness of breath.  . clopidogrel (PLAVIX) 75 MG tablet TAKE 1 TABLET (75 MG TOTAL) BY MOUTH DAILY.  Marland Kitchen donepezil (ARICEPT)  10 MG tablet TAKE 1 TABLET (10 MG TOTAL) BY MOUTH DAILY.  . isosorbide mononitrate (IMDUR) 30 MG 24 hr tablet TAKE 1 TABLET (30 MG TOTAL) BY MOUTH DAILY.  . memantine (NAMENDA XR) 28 MG CP24 24 hr capsule Take 1 capsule (28 mg total) by mouth daily.  . metoprolol tartrate (LOPRESSOR) 25 MG tablet Take 0.5 tablets (12.5 mg total) by mouth daily. For blood pressure and heart  . Nutritional Supplements (NUTRITIONAL SUPPLEMENT PO) NAS (No Salt Added) diet - Mechanical Soft Texture.  Nectar thickened Fluids consistency, add fortified foods  . Multiple Vitamin (MULTIVITAMIN) tablet Take 1 tablet by mouth daily.  Marland Kitchen nystatin (MYCOSTATIN/NYSTOP) powder Apply topically 2 (two) times daily.   No facility-administered encounter medications on file as of 09/26/2017.     Allergies (verified) Patient has no known allergies.   History: Past Medical History:  Diagnosis Date  . CHF (congestive heart failure) (HCC)   . Constipation   . COPD (chronic obstructive pulmonary disease) (HCC)   . Dementia   . Frequency of urination   . Hypertension   . Incontinence   . Pneumonia   . Spinal stenosis    Past Surgical History:  Procedure Laterality Date  . COLONOSCOPY  1991   Family History  Problem Relation Age of Onset  . Cancer Brother   . Hypertension Daughter   . Cancer Son    Social History   Socioeconomic  History  . Marital status: Widowed    Spouse name: Not on file  . Number of children: Not on file  . Years of education: Not on file  . Highest education level: Not on file  Occupational History  . Not on file  Social Needs  . Financial resource strain: Not hard at all  . Food insecurity:    Worry: Never true    Inability: Never true  . Transportation needs:    Medical: No    Non-medical: No  Tobacco Use  . Smoking status: Former Smoker    Last attempt to quit: 09/27/1978    Years since quitting: 39.0  . Smokeless tobacco: Never Used  Substance and Sexual Activity  . Alcohol use:  No  . Drug use: No  . Sexual activity: Not on file  Lifestyle  . Physical activity:    Days per week: 0 days    Minutes per session: 0 min  . Stress: Not on file  Relationships  . Social connections:    Talks on phone: More than three times a week    Gets together: More than three times a week    Attends religious service: Never    Active member of club or organization: No    Attends meetings of clubs or organizations: Never    Relationship status: Widowed  Other Topics Concern  . Not on file  Social History Narrative  . Not on file    Tobacco Counseling Counseling given: Not Answered   Clinical Intake:  Pre-visit preparation completed: No  Pain : Faces Faces Pain Scale: No hurt  Faces Pain Scale: No hurt  Nutritional Risks: None Diabetes: Yes CBG done?: No Did pt. bring in CBG monitor from home?: No  How often do you need to have someone help you when you read instructions, pamphlets, or other written materials from your doctor or pharmacy?: 1 - Never What is the last grade level you completed in school?: HS  Interpreter Needed?: No  Information entered by :: Tyron Russell, RN   Activities of Daily Living In your present state of health, do you have any difficulty performing the following activities: 09/26/2017  Hearing? Y  Vision? Y  Difficulty concentrating or making decisions? Y  Walking or climbing stairs? Y  Dressing or bathing? Y  Doing errands, shopping? Y  Preparing Food and eating ? Y  Using the Toilet? Y  In the past six months, have you accidently leaked urine? Y  Do you have problems with loss of bowel control? Y  Managing your Medications? Y  Managing your Finances? Y  Housekeeping or managing your Housekeeping? Y  Some recent data might be hidden     Immunizations and Health Maintenance Immunization History  Administered Date(s) Administered  . PPD Test 08/14/2017  . Pneumococcal Polysaccharide-23 05/29/1992   There are no  preventive care reminders to display for this patient.  Patient Care Team: Kirt Boys, DO as PCP - General (Internal Medicine) Chilton Si Chong Sicilian, NP as Nurse Practitioner (Geriatric Medicine) [facility]  Indicate any recent Medical Services you may have received from other than [facility] providers in the past year (date may be approximate).     Assessment:   This is a routine wellness examination for Blondell.  Hearing/Vision screen Hearing Screening Comments: Pt does not have hearing aids Vision Screening Comments: Patient does not see eye doctor  Dietary issues and exercise activities discussed: Current Exercise Habits: The patient does not  participate in regular exercise at present, Exercise limited by: orthopedic condition(s);neurologic condition(s)  Goals    None     Depression Screen PHQ 2/9 Scores 09/26/2017 08/10/2017 01/22/2013 12/24/2012  PHQ - 2 Score - - 0 2  PHQ- 9 Score - - - 2  Exception Documentation Medical reason Other- indicate reason in comment box - -  Not completed - Patient with dementia, unable to obtain screening  - -    Fall Risk Fall Risk  09/26/2017 08/10/2017 10/16/2014 01/22/2013 12/24/2012  Falls in the past year? No No No No Yes  Number falls in past yr: - - - - 1  Injury with Fall? - - - - Yes  Risk for fall due to : - - - - Impaired balance/gait    Is the patient's home free of loose throw rugs in walkways, pet beds, electrical cords, etc?   yes      Grab bars in the bathroom? yes      Handrails on the stairs?   yes      Adequate lighting?   yes  Cognitive Function: MMSE - Mini Mental State Exam 09/26/2017 12/24/2012  Orientation to time 0 5  Orientation to Place 3 5  Registration 3 3  Attention/ Calculation 0 5  Recall 0 2  Language- name 2 objects 2 2  Language- repeat 1 1  Language- follow 3 step command 3 3  Language- read & follow direction 1 1  Write a sentence 0 1  Copy design 0 1  Total score 13  29        Screening Tests Health Maintenance  Topic Date Due  . INFLUENZA VACCINE  08/15/2018 (Originally 12/27/2017)  . DEXA SCAN  08/15/2018 (Originally 07/29/1986)  . TETANUS/TDAP  08/15/2018 (Originally 07/28/1938)  . PNA vac Low Risk Adult (2 of 2 - PCV13) 08/15/2018 (Originally 05/29/1993)    Qualifies for Shingles Vaccine? Yes, educated and will check cost  Cancer Screenings: Lung: Low Dose CT Chest recommended if Age 1-80 years, 30 pack-year currently smoking OR have quit w/in 15years. Patient does not qualify. Breast: Up to date on Mammogram? Yes   Up to date of Bone Density/Dexa? No, excluded Colorectal: up to date  Additional Screenings:  Hepatitis C Screening: declined Patient's daughter is going to check with insurance for past pneumonia    Plan:    I have personally reviewed and addressed the Medicare Annual Wellness questionnaire and have noted the following in the patient's chart:  A. Medical and social history B. Use of alcohol, tobacco or illicit drugs  C. Current medications and supplements D. Functional ability and status E.  Nutritional status F.  Physical activity G. Advance directives H. List of other physicians I.  Hospitalizations, surgeries, and ER visits in previous 12 months J.  Vitals K. Screenings to include hearing, vision, cognitive, depression L. Referrals and appointments - none  In addition, I am unable to review and discuss with incapacitated patient certain preventive protocols, quality metrics, and best practice recommendations. A written personalized care plan for preventive services as well as general preventive health recommendations were provided to patient.   See attached scanned questionnaire for additional information.   Signed,   Tyron Russell, RN Nurse Health Advisor  Patient Concerns: Left arm tear yesterday

## 2017-09-26 NOTE — Patient Instructions (Signed)
[name] , Thank you for taking time to come for your Medicare Wellness Visit. I appreciate your ongoing commitment to your health goals. Please review the following plan we discussed and let me know if I can assist you in the future.   Screening recommendations/referrals: Colonoscopy excluded Mammogram excluded Bone Density excluded Recommended yearly ophthalmology/optometry visit for glaucoma screening and checkup Recommended yearly dental visit for hygiene and checkup  Vaccinations: Influenza vaccine declined Pneumococcal vaccine please check with insurance if she ever got Prevnar or Pneumovax and let us know the dates Tdap vaccine please check last tetanus shot and let us know the sates Shingles vaccine please check cost    Advanced directives: Please see if she has filled out a living will or health care power of attorney  Conditions/risks identified: None  Next appointment: Tyron Russell, RN 10/02/2018 @ 10:45pm   Preventive Care 82 Years and Older, Female Preventive care refers to lifestyle choices and visits with your health care provider that can promote health and wellness. What does preventive care include?  A yearly physical exam. This is also called an annual well check.  Dental exams once or twice a year.  Routine eye exams. Ask your health care provider how often you should have your eyes checked.  Personal lifestyle choices, including:  Daily care of your teeth and gums.  Regular physical activity.  Eating a healthy diet.  Avoiding tobacco and drug use.  Limiting alcohol use.  Practicing safe sex.  Taking low-dose aspirin every day.  Taking vitamin and mineral supplements as recommended by your health care provider. What happens during an annual well check? The services and screenings done by your health care provider during your annual well check will depend on your age, overall health, lifestyle risk factors, and family history of  disease. Counseling  Your health care provider may ask you questions about your:  Alcohol use.  Tobacco use.  Drug use.  Emotional well-being.  Home and relationship well-being.  Sexual activity.  Eating habits.  History of falls.  Memory and ability to understand (cognition).  Work and work Astronomer.  Reproductive health. Screening  You may have the following tests or measurements:  Height, weight, and BMI.  Blood pressure.  Lipid and cholesterol levels. These may be checked every 5 years, or more frequently if you are over 82 years old.  Skin check.  Lung cancer screening. You may have this screening every year starting at age 82 if you have a 30-pack-year history of smoking and currently smoke or have quit within the past 15 years.  Fecal occult blood test (FOBT) of the stool. You may have this test every year starting at age 82.  Flexible sigmoidoscopy or colonoscopy. You may have a sigmoidoscopy every 5 years or a colonoscopy every 10 years starting at age 82.  Hepatitis C blood test.  Hepatitis B blood test.  Sexually transmitted disease (STD) testing.  Diabetes screening. This is done by checking your blood sugar (glucose) after you have not eaten for a while (fasting). You may have this done every 1-3 years.  Bone density scan. This is done to screen for osteoporosis. You may have this done starting at age 82.  Mammogram. This may be done every 1-2 years. Talk to your health care provider about how often you should have regular mammograms. Talk with your health care provider about your test results, treatment options, and if necessary, the need for more tests. Vaccines  Your health care provider may  recommend certain vaccines, such as:  Influenza vaccine. This is recommended every year.  Tetanus, diphtheria, and acellular pertussis (Tdap, Td) vaccine. You may need a Td booster every 10 years.  Zoster vaccine. You may need this after age  82.  Pneumococcal 13-valent conjugate (PCV13) vaccine. One dose is recommended after age 82.  Pneumococcal polysaccharide (PPSV23) vaccine. One dose is recommended after age 82. Talk to your health care provider about which screenings and vaccines you need and how often you need them. This information is not intended to replace advice given to you by your health care provider. Make sure you discuss any questions you have with your health care provider. Document Released: 06/11/2015 Document Revised: 02/02/2016 Document Reviewed: 03/16/2015 Elsevier Interactive Patient Education  2017 [place] Prevention in the Home Falls can cause injuries. They can happen to people of all ages. There are many things you can do to make your home safe and to help prevent falls. What can I do on the outside of my home?  Regularly fix the edges of walkways and driveways and fix any cracks.  Remove anything that might make you trip as you walk through a door, such as a raised step or threshold.  Trim any bushes or trees on the path to your home.  Use bright outdoor lighting.  Clear any walking paths of anything that might make someone trip, such as rocks or tools.  Regularly check to see if handrails are loose or broken. Make sure that both sides of any steps have handrails.  Any raised decks and porches should have guardrails on the edges.  Have any leaves, snow, or ice cleared regularly.  Use sand or salt on walking paths during winter.  Clean up any spills in your garage right away. This includes oil or grease spills. What can I do in the bathroom?  Use night lights.  Install grab bars by the toilet and in the tub and shower. Do not use towel bars as grab bars.  Use non-skid mats or decals in the tub or shower.  If you need to sit down in the shower, use a plastic, non-slip stool.  Keep the floor dry. Clean up any water that spills on the floor as soon as it happens.  Remove  soap buildup in the tub or shower regularly.  Attach bath mats securely with double-sided non-slip rug tape.  Do not have throw rugs and other things on the floor that can make you trip. What can I do in the bedroom?  Use night lights.  Make sure that you have a light by your bed that is easy to reach.  Do not use any sheets or blankets that are too big for your bed. They should not hang down onto the floor.  Have a firm chair that has side arms. You can use this for support while you get dressed.  Do not have throw rugs and other things on the floor that can make you trip. What can I do in the kitchen?  Clean up any spills right away.  Avoid walking on wet floors.  Keep items that you use a lot in easy-to-reach places.  If you need to reach something above you, use a strong step stool that has a grab bar.  Keep electrical cords out of the way.  Do not use floor polish or wax that makes floors slippery. If you must use wax, use non-skid floor wax.  Do not have throw rugs and  other things on the floor that can make you trip. What can I do with my stairs?  Do not leave any items on the stairs.  Make sure that there are handrails on both sides of the stairs and use them. Fix handrails that are broken or loose. Make sure that handrails are as long as the stairways.  Check any carpeting to make sure that it is firmly attached to the stairs. Fix any carpet that is loose or worn.  Avoid having throw rugs at the top or bottom of the stairs. If you do have throw rugs, attach them to the floor with carpet tape.  Make sure that you have a light switch at the top of the stairs and the bottom of the stairs. If you do not have them, ask someone to add them for you. What else can I do to help prevent falls?  Wear shoes that:  Do not have high heels.  Have rubber bottoms.  Are comfortable and fit you well.  Are closed at the toe. Do not wear sandals.  If you use a  stepladder:  Make sure that it is fully opened. Do not climb a closed stepladder.  Make sure that both sides of the stepladder are locked into place.  Ask someone to hold it for you, if possible.  Clearly mark and make sure that you can see:  Any grab bars or handrails.  First and last steps.  Where the edge of each step is.  Use tools that help you move around (mobility aids) if they are needed. These include:  Canes.  Walkers.  Scooters.  Crutches.  Turn on the lights when you go into a dark area. Replace any light bulbs as soon as they burn out.  Set up your furniture so you have a clear path. Avoid moving your furniture around.  If any of your floors are uneven, fix them.  If there are any pets around you, be aware of where they are.  Review your medicines with your doctor. Some medicines can make you feel dizzy. This can increase your chance of falling. Ask your doctor what other things that you can do to help prevent falls. This information is not intended to replace advice given to you by your health care provider. Make sure you discuss any questions you have with your health care provider. Document Released: 03/11/2009 Document Revised: 10/21/2015 Document Reviewed: 06/19/2014 Elsevier Interactive Patient Education  2017 [employer].

## 2017-09-26 NOTE — Progress Notes (Signed)
Patient ID: [name], female   DOB: March 23, 1920, 84 y.o.   MRN: [id number]   Location:  [facility] OFFICE  Provider: DR Elmon Kirschner  Code Status: DNR Goals of Care:  Advanced Directives 09/26/2017  Does Patient Have a Medical Advance Directive? Yes  Type of Estate agent of [place];Living will;Out of facility DNR (pink MOST or yellow form)  Does patient want to make changes to medical advance directive? No - Patient declined  Copy of Healthcare Power of Attorney in Chart? No - copy requested  Would patient like information on creating a medical advance directive? -  Pre-existing out of facility DNR order (yellow form or pink MOST form) Yellow form placed in chart (order not valid for inpatient use)     Chief Complaint  Patient presents with  . Transitions Of Care    Patient was discharged from [facility] on 09/13/17, prior to nursing home admission patient was in hospital 08/11/17-08/13/17 for altered mental status. Patient here with daughter, arrived via EMS transport. AWV completed today, -fall risk, unable to complete depression screening, MMSE 13/30 (unable to completed due to arthritis and dementia)   . Medication Refill    No refills needed   . Skin Problem    Left arm skin tear as a result of daughter transferring patient from one position to the next, onset 09/25/17     HPI: Patient is a 84 y.o. female seen today for medical management of chronic diseases.  She is a poor historian due to dementia. Hx obtained from chart and daughter. Pt sustained left forearm tear yesterday after daughter had to grab her to prevent her from falling while transferring her. She was d/c'd from [facility] 09/13/17 following short term rehab for deconditioning, hypernatremia, hyperkalemia, combined HF. Daughter states insurance ran out and she was unable to afford copay to continue SNF. Daughter aware that SNF is the best plcement for pt due to her worsening dementia and  FTT. AWV completed today  COPD - stable with no recent exacerbations. Last CT chest wo contrast in 2014 revealed multiple ground glass densities s/o interstitial fibrotic change; CXR in 2016 showed interstitial markings had improved but still present s/o chronic process. She gets prn albuterol nebs  Hx CHF/HTN/CAD - stable on imdur, metoprolol and plavix. BP is low today - pt asymptomatic  Dementia, Alzheimer's and vascular - end - stage. She currently takes namenda and aricept but no benefit is noted. MMSE 13/30 today. Weight down 3 lbs and progressive which is an expected result of end stage dementia. She gets nutritional supplement. She has occasional auditory and visual hallucinations. No behavioral disturbances. She is incontinent of urine and stool. She feeds herself. She needs assistance with meal prep, grooming, bathing. She is immobile and requires total assistance with transfers and most ADLs. Daughter uses w/c at home.  Urinary incontinence - unchanged. She is no longer on vesicare  Past Medical History:  Diagnosis Date  . CHF (congestive heart failure) (HCC)   . Constipation   . COPD (chronic obstructive pulmonary disease) (HCC)   . Dementia   . Frequency of urination   . Hypertension   . Incontinence   . Pneumonia   . Spinal stenosis     Past Surgical History:  Procedure Laterality Date  . COLONOSCOPY  1991     reports that she quit smoking about 39 years ago. She has never used smokeless tobacco. She reports that she does not drink alcohol or  use drugs. Social History   Socioeconomic History  . Marital status: Widowed    Spouse name: Not on file  . Number of children: Not on file  . Years of education: Not on file  . Highest education level: Not on file  Occupational History  . Not on file  Social Needs  . Financial resource strain: Not hard at all  . Food insecurity:    Worry: Never true    Inability: Never true  . Transportation needs:    Medical: No     Non-medical: No  Tobacco Use  . Smoking status: Former Smoker    Last attempt to quit: 09/27/1978    Years since quitting: 39.0  . Smokeless tobacco: Never Used  Substance and Sexual Activity  . Alcohol use: No  . Drug use: No  . Sexual activity: Not on file  Lifestyle  . Physical activity:    Days per week: 0 days    Minutes per session: 0 min  . Stress: Not on file  Relationships  . Social connections:    Talks on phone: More than three times a week    Gets together: More than three times a week    Attends religious service: Never    Active member of club or organization: No    Attends meetings of clubs or organizations: Never    Relationship status: Widowed  . Intimate partner violence:    Fear of current or ex partner: Not on file    Emotionally abused: Not on file    Physically abused: Not on file    Forced sexual activity: Not on file  Other Topics Concern  . Not on file  Social History Narrative  . Not on file    Family History  Problem Relation Age of Onset  . Cancer Brother   . Hypertension Daughter   . Cancer Son     No Known Allergies  Outpatient Encounter Medications as of 09/26/2017  Medication Sig  . albuterol (PROVENTIL) (2.5 MG/3ML) 0.083% nebulizer solution Take 3 mLs (2.5 mg total) by nebulization every 6 (six) hours as needed for wheezing or shortness of breath.  . clopidogrel (PLAVIX) 75 MG tablet TAKE 1 TABLET (75 MG TOTAL) BY MOUTH DAILY.  Marland Kitchen donepezil (ARICEPT) 10 MG tablet TAKE 1 TABLET (10 MG TOTAL) BY MOUTH DAILY.  . isosorbide mononitrate (IMDUR) 30 MG 24 hr tablet TAKE 1 TABLET (30 MG TOTAL) BY MOUTH DAILY.  . memantine (NAMENDA XR) 28 MG CP24 24 hr capsule Take 1 capsule (28 mg total) by mouth daily.  . metoprolol tartrate (LOPRESSOR) 25 MG tablet Take 0.5 tablets (12.5 mg total) by mouth daily. For blood pressure and heart  . Multiple Vitamin (MULTIVITAMIN) tablet Take 1 tablet by mouth daily.  . Nutritional Supplements (NUTRITIONAL  SUPPLEMENT PO) NAS (No Salt Added) diet - Mechanical Soft Texture.  Nectar thickened Fluids consistency, add fortified foods  . nystatin (MYCOSTATIN/NYSTOP) powder Apply topically 2 (two) times daily.   No facility-administered encounter medications on file as of 09/26/2017.     Review of Systems:  Review of Systems  Unable to perform ROS: Dementia    Health Maintenance  Topic Date Due  . INFLUENZA VACCINE  08/15/2018 (Originally 12/27/2017)  . DEXA SCAN  08/15/2018 (Originally 07/29/1986)  . TETANUS/TDAP  08/15/2018 (Originally 07/28/1940)  . PNA vac Low Risk Adult (2 of 2 - PCV13) 08/15/2018 (Originally 05/29/1993)    Physical Exam: Vitals:   09/26/17 1100  BP: (!) 84/50  Pulse: 75  Temp: 97.6 F (36.4 C)  TempSrc: Oral   There is no height or weight on file to calculate BMI. Physical Exam  Constitutional: She is oriented to person, place, and time. She appears well-developed and well-nourished.  Frail appearing in NAD, sitting in w/c  HENT:  Mouth/Throat: Oropharynx is clear and moist. No oropharyngeal exudate.  Eyes: Pupils are equal, round, and reactive to light. No scleral icterus.  Neck: Neck supple. Carotid bruit is not present. No tracheal deviation present. No thyromegaly present.  Cardiovascular: Regular rhythm and intact distal pulses. Bradycardia present. Exam reveals no gallop and no friction rub.  Murmur heard.  Systolic murmur is present with a grade of 1/6. No LE edema b/l. No calf TTP  Pulmonary/Chest: Effort normal. No stridor. No respiratory distress. She has no wheezes. She has rales (left base inspiratory).  Abdominal: Soft. Bowel sounds are normal. She exhibits no distension and no mass. There is no hepatomegaly. There is no tenderness. There is no rebound and no guarding.  Musculoskeletal: She exhibits edema.  Lymphadenopathy:    She has no cervical adenopathy.  Neurological: She is alert and oriented to person, place, and time. She has normal reflexes.    Skin: Skin is warm and dry. No rash noted.  Left forearm x3 skin tears with skin intact; no secondary signs of infection; large contusion  Psychiatric: She has a normal mood and affect. Her behavior is normal. Thought content is delusional.    Labs reviewed: Basic Metabolic Panel: Recent Labs    08/10/17 1055  08/12/17 0130 08/12/17 1001 08/13/17 0521 08/15/17  NA 159*   < > 153* 147* 144 142  K 3.8   < > 3.4* 3.0* 3.4* 3.8  CL 120*   < > 118* 113* 109  --   CO2 29   < > 23 24 24   --   GLUCOSE 94   < > 150* 89 83  --   BUN 38*   < > 44* 38* 30* 28*  CREATININE 1.72*   < > 1.70* 1.54* 1.33* 1.3*  CALCIUM 9.4   < > 8.3* 8.1* 8.0*  --   PHOS  --   --   --   --  3.0  --   TSH 2.08  --   --   --   --   --    < > = values in this interval not displayed.   Liver Function Tests: Recent Labs    08/10/17 1055 08/11/17 1435 08/13/17 0521 08/15/17  AST 17 54*  --  20  ALT 11 14  --  10  ALKPHOS  --  59  --  57  BILITOT 0.5 1.4*  --   --   PROT 7.0 6.9  --   --   ALBUMIN  --  3.1* 2.5*  --    No results for input(s): LIPASE, AMYLASE in the last 8760 hours. No results for input(s): AMMONIA in the last 8760 hours. CBC: Recent Labs    08/10/17 1055 08/11/17 1435 08/11/17 1542 08/12/17 0040 08/15/17  WBC 17.1* 13.8*  --  11.9* 10.1  NEUTROABS 14,313* 11.5*  --   --  7  HGB 12.1 11.8* 11.6* 10.1* 9.6*  HCT 35.5 37.7 34.0* 32.9* 28*  MCV 88.3 95.0  --  95.6  --   PLT 162 154  --  128* 150   Lipid Panel: No results for input(s): CHOL, HDL, LDLCALC, TRIG, CHOLHDL, LDLDIRECT in the last  8760 hours. Lab Results  Component Value Date   HGBA1C 5.5 06/30/2014    Procedures since last visit: No results found.  Assessment/Plan   ICD-10-CM   1. Hypotension, unspecified hypotension type I95.9 CBC (no diff)    BMP with eGFR([facility])  2. Failure to thrive in adult R62.7 CBC (no diff)  3. 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 Pulmonary  emphysema, unspecified emphysema type (HCC) J43.9   5. High risk medication use Z79.899 CBC (no diff)  6. Skin tear of left forearm without complication, initial encounter S51.812A     Has forms to complete for VA benefits and insurnce   Skin tear cleaned, bacitracin ointment applied and tegaderm dsg applied  She really needs palliative care/hospice due to end stage dementia - daughter not interested at this time.  emove Tegaderm as directed when it begins to peel off  Clean with warm antibacterial soapy water and apply clean nonstick dressing  STOP ARICEPT AND NAMENDA XR AS SHE IS NOT GETTING ANY BENEFIT FROM MEDICATION  STOP ISOSORBIDE AS BLOOD PRESSURE IS LOW  Continue other medications as ordered  Cont nutritional supplements as ordered  Will call with lab results  Follow up as scheduled May 21st.  Yolanda Huffstetler S. Ancil Linsey  [facility] [address] [place], [place] [address] [phone] Cell (Monday-Friday 8 AM - 5 PM) [phone] After 5 PM and follow prompts

## 2017-09-26 NOTE — Patient Instructions (Addendum)
Remove Tegaderm as directed when it begins to peel off  Clean with warm antibacterial soapy water and apply clean nonstick dressing  STOP ARICEPT AND NAMENDA XR AS SHE IS NOT GETTING ANY BENEFIT FROM MEDICATION  STOP ISOSORBIDE AS BLOOD PRESSURE IS LOW  Continue other medications as ordered  Cont nutritional supplements as ordered  Will call with lab results  Follow up as scheduled May 21st.

## 2017-09-27 ENCOUNTER — Encounter (HOSPITAL_COMMUNITY): Payer: Self-pay | Admitting: *Deleted

## 2017-09-27 ENCOUNTER — Telehealth: Payer: Self-pay | Admitting: *Deleted

## 2017-09-27 ENCOUNTER — Telehealth: Payer: Self-pay

## 2017-09-27 ENCOUNTER — Emergency Department (HOSPITAL_COMMUNITY)
Admission: EM | Admit: 2017-09-27 | Discharge: 2017-09-28 | Disposition: A | Discharge status: Home / Self Care | Payer: Medicare Other | Attending: Emergency Medicine | Admitting: Emergency Medicine

## 2017-09-27 DIAGNOSIS — Z87891 Personal history of nicotine dependence: Secondary | ICD-10-CM | POA: Diagnosis not present

## 2017-09-27 DIAGNOSIS — Z79899 Other long term (current) drug therapy: Secondary | ICD-10-CM | POA: Diagnosis not present

## 2017-09-27 DIAGNOSIS — I5042 Chronic combined systolic (congestive) and diastolic (congestive) heart failure: Secondary | ICD-10-CM | POA: Insufficient documentation

## 2017-09-27 DIAGNOSIS — N182 Chronic kidney disease, stage 2 (mild): Secondary | ICD-10-CM | POA: Insufficient documentation

## 2017-09-27 DIAGNOSIS — J449 Chronic obstructive pulmonary disease, unspecified: Secondary | ICD-10-CM | POA: Diagnosis not present

## 2017-09-27 DIAGNOSIS — R799 Abnormal finding of blood chemistry, unspecified: Secondary | ICD-10-CM | POA: Diagnosis present

## 2017-09-27 DIAGNOSIS — I130 Hypertensive heart and chronic kidney disease with heart failure and stage 1 through stage 4 chronic kidney disease, or unspecified chronic kidney disease: Secondary | ICD-10-CM | POA: Insufficient documentation

## 2017-09-27 DIAGNOSIS — F0390 Unspecified dementia without behavioral disturbance: Secondary | ICD-10-CM | POA: Insufficient documentation

## 2017-09-27 DIAGNOSIS — I2510 Atherosclerotic heart disease of native coronary artery without angina pectoris: Secondary | ICD-10-CM | POA: Diagnosis not present

## 2017-09-27 LAB — CBC WITH DIFFERENTIAL/PLATELET
Basophils Absolute: 0 10*3/uL (ref 0.0–0.1)
Basophils Relative: 0 %
EOS PCT: 0 %
Eosinophils Absolute: 0 10*3/uL (ref 0.0–0.7)
HCT: 30.7 % — ABNORMAL LOW (ref 36.0–46.0)
Hemoglobin: 9.6 g/dL — ABNORMAL LOW (ref 12.0–15.0)
LYMPHS ABS: 1.8 10*3/uL (ref 0.7–4.0)
LYMPHS PCT: 18 %
MCH: 29.4 pg (ref 26.0–34.0)
MCHC: 31.3 g/dL (ref 30.0–36.0)
MCV: 94.2 fL (ref 78.0–100.0)
MONO ABS: 0.9 10*3/uL (ref 0.1–1.0)
MONOS PCT: 9 %
Neutro Abs: 7.2 10*3/uL (ref 1.7–7.7)
Neutrophils Relative %: 73 %
PLATELETS: 154 10*3/uL (ref 150–400)
RBC: 3.26 MIL/uL — AB (ref 3.87–5.11)
RDW: 16.6 % — AB (ref 11.5–15.5)
WBC: 9.9 10*3/uL (ref 4.0–10.5)

## 2017-09-27 LAB — BASIC METABOLIC PANEL
Anion gap: 9 (ref 5–15)
BUN: 19 mg/dL (ref 6–20)
CO2: 23 mmol/L (ref 22–32)
Calcium: 8.2 mg/dL — ABNORMAL LOW (ref 8.9–10.3)
Chloride: 111 mmol/L (ref 101–111)
Creatinine, Ser: 0.94 mg/dL (ref 0.44–1.00)
GFR calc Af Amer: 57 mL/min — ABNORMAL LOW (ref >60)
GFR, EST NON AFRICAN AMERICAN: 50 mL/min — AB (ref >60)
GLUCOSE: 98 mg/dL (ref 65–99)
POTASSIUM: 3.1 mmol/L — AB (ref 3.5–5.1)
Sodium: 143 mmol/L (ref 135–145)

## 2017-09-27 LAB — BASIC METABOLIC PANEL WITH GFR
BUN / CREAT RATIO: 20 (calc) (ref 6–22)
BUN: 29 mg/dL — ABNORMAL HIGH (ref 7–25)
CO2: 23 mmol/L (ref 20–32)
CREATININE: 1.42 mg/dL — AB (ref 0.60–0.88)
Calcium: 8.4 mg/dL — ABNORMAL LOW (ref 8.6–10.4)
Chloride: 119 mmol/L — ABNORMAL HIGH (ref 98–110)
GFR, EST AFRICAN AMERICAN: 36 mL/min/{1.73_m2} — AB (ref >=60)
GFR, EST NON AFRICAN AMERICAN: 31 mL/min/{1.73_m2} — AB (ref >=60)
Glucose, Bld: 82 mg/dL (ref 65–139)
Potassium: 3.1 mmol/L — ABNORMAL LOW (ref 3.5–5.3)
Sodium: 150 mmol/L — ABNORMAL HIGH (ref 135–146)

## 2017-09-27 NOTE — Telephone Encounter (Signed)
Incoming faxed received from [employer] with [facility]:  " I reached out to Mrs.Peppel on 09/25/17 and offered to meet with her for social work evaluation/services. She declined my visit stating that her daughter is taking care of things and did not see a need for social work visit. I wanted to make you aware."  Fax was placed in Dr.Cater's review and sign folder.

## 2017-09-27 NOTE — ED Triage Notes (Signed)
Pt arrives via EMS from home. Pt was seen earlier today at [facility], called back for abnormal sodium levels. Pt has hx of dementia, at the scene, the pt daughter said pt was at baseline.  EMS Palpated BP 110, HR 64 with PVC's.

## 2017-09-27 NOTE — ED Provider Notes (Signed)
[facility] EMERGENCY DEPARTMENT Provider Note   CSN: [id number] Arrival date & time: 09/27/17  2029  History   Chief Complaint Chief Complaint  Patient presents with  . Abnormal Labs    HPI [name] is a 84 y.o. female the past medical history of dementia, heart failure, COPD who presents to the ED due to abnormal labs that outpatient visit.  She is accompanied by family members who provide majority of history.  She was recently admitted in March for management of hypernatremia felt to be secondary to dementia and dehydration.  Following discharge she resided in the skilled nursing facility until 4/18, currently living with daughter. At baseline, patient is immobile, incontinent, able to feed herself and is conversational.  Family denies any significant mental status changes, appears to have been maintaining p.o. intake recently per their report.  Past Medical History:  Diagnosis Date  . CHF (congestive heart failure) (HCC)   . Constipation   . COPD (chronic obstructive pulmonary disease) (HCC)   . Dementia   . Frequency of urination   . Hypertension   . Incontinence   . Pneumonia   . Spinal stenosis     Patient Active Problem List   Diagnosis Date Noted  . Essential hypertension, benign 08/11/2017  . High risk medication use 08/11/2017  . Acute hypernatremia 08/11/2017  . Hyperkalemia 08/11/2017  . UTI (urinary tract infection) 08/11/2017  . CAD (coronary artery disease) 07/08/2014  . Failure to thrive in adult 06/30/2014  . Chronic combined systolic and diastolic heart failure (HCC) 08/13/2013  . Hypertensive heart disease 08/13/2013  . Urinary incontinence due to cognitive impairment 08/13/2013  . Vitamin D insufficiency 08/13/2013  . Hypocalcemia 05/27/2013  . ACS (acute coronary syndrome) (HCC) 04/25/2013  . Anxiety state 04/22/2013  . COPD (chronic obstructive pulmonary disease) with emphysema (HCC) 04/22/2013  . Mixed Alzheimer's and  vascular dementia 01/22/2013  . Chronic kidney disease (CKD), stage II (mild) 01/22/2013  . Constipation     Past Surgical History:  Procedure Laterality Date  . COLONOSCOPY  1991     OB History   None      Home Medications    Prior to Admission medications   Medication Sig Start Date End Date Taking? Authorizing Provider  albuterol (PROVENTIL) (2.5 MG/3ML) 0.083% nebulizer solution Take 3 mLs (2.5 mg total) by nebulization every 6 (six) hours as needed for wheezing or shortness of breath. 06/21/16  Yes Kirt Boys, DO  clopidogrel (PLAVIX) 75 MG tablet TAKE 1 TABLET (75 MG TOTAL) BY MOUTH DAILY. 04/04/17  Yes Montez Morita, Monica, DO  metoprolol tartrate (LOPRESSOR) 25 MG tablet Take 0.5 tablets (12.5 mg total) by mouth daily. For blood pressure and heart 06/21/16  Yes Kirt Boys, DO  Multiple Vitamin (MULTIVITAMIN) tablet Take 1 tablet by mouth daily. 08/21/17   [provider]  Nutritional Supplements (NUTRITIONAL SUPPLEMENT PO) NAS (No Salt Added) diet - Mechanical Soft Texture.  Nectar thickened Fluids consistency, add fortified foods    [provider]    Family History Family History  Problem Relation Age of Onset  . Cancer Brother   . Hypertension Daughter   . Cancer Son     Social History Social History   Tobacco Use  . Smoking status: Former Smoker    Last attempt to quit: 09/27/1978    Years since quitting: 39.0  . Smokeless tobacco: Never Used  Substance Use Topics  . Alcohol use: No  . Drug use: No  Allergies   Patient has no known allergies.   Review of Systems Unable to be reliably obtained due to patient mental status  Physical Exam Updated Vital Signs BP (!) 166/73 (BP Location: Right Arm)   Pulse 62   Temp 98.6 F (37 C) (Oral)   Resp 16   SpO2 100%   General: Elderly frail female resting comfortably, no acute distress Head: Normocephalic, atraumatic Eyes: Normal conjunctiva, arcus senilis ENT: Moist mucous  membranes, no thrush CV: Regular rate and rhythm Resp: Clear breath sounds bilaterally, normal work of breathing, no distress  Abd: Soft, +BS, nontender to palpation Extr: Cachexia, no lower extremity edema Neuro: Alert and oriented to self and place, answers appropriately and following commands, good upper extremity strength Skin: Warm, dry, fragile       ED Treatments / Results  Labs (all labs ordered are listed, but only abnormal results are displayed) Labs Reviewed  CBC WITH DIFFERENTIAL/PLATELET - Abnormal; Notable for the following components:      Result Value   RBC 3.26 (*)    Hemoglobin 9.6 (*)    HCT 30.7 (*)    RDW 16.6 (*)    All other components within normal limits  BASIC METABOLIC PANEL - Abnormal; Notable for the following components:   Potassium 3.1 (*)    Calcium 8.2 (*)    GFR calc non Af Amer 50 (*)    GFR calc Af Amer 57 (*)    All other components within normal limits    EKG None  Radiology No results found.   Medications Ordered in ED Medications - No data to display   Initial Impression / Assessment and Plan / ED Course  I have reviewed the triage vital signs and the nursing notes.  Pertinent labs & imaging results that were available during my care of the patient were reviewed by me and considered in my medical decision making (see chart for details).  84 year old female with recent admission followed by SNF stay for hypernatremia presenting advice of [facility] care physician for lab abnormalities.  Chart review of outpatient labs yesterday shows mild elevations in white blood cell count, hemoglobin 9.3-these values similar to prior at time of admission.  BMP with sodium 150, creatinine 1.42, potassium 3.1. Creatinine similar to prior at the time of discharge from prior hospitalization.  On exam, she does not appear significantly dehydrated and she is at baseline mental status per family.   Repeat labs here without leukocytosis, hemoglobin  9.6-similar to prior.  BMP returned with sodium 143, K 3.1, creatinine 0.94.  No intervention was taken given the improvement in lab work.  Changes may be related to changes with p.o. intake or hydration.  Will defer decision for regular potassium supplementation to PCP.  Recommend close follow-up, continue to encourage p.o. intake.  Ultimately, patient likely needs palliative care services, however family again reluctant to engage in discussions when broached during this visit.  Final Clinical Impressions(s) / ED Diagnoses   Final diagnoses:  Dementia without behavioral disturbance, unspecified dementia type    ED Discharge Orders    None       Ginger Carne, MD 09/27/17 4259    Gerhard Munch, MD 09/28/17 2334

## 2017-09-27 NOTE — Telephone Encounter (Signed)
Received Completed [facility] Forms in [facility]ray. Forms are for examination for housebound status or permanent need for regular aid and attendance. Forms were completed and signed by Dr. Montez Morita.  Forms were faxed to [facility] [phone] [facility] [address] [place] [address] Fax#:[phone].  Sent for scanning.

## 2017-09-27 NOTE — Discharge Instructions (Addendum)
Nice to meet you Ms. Bradburn.  When we repeated your lab work today, everything looked good including sodium, kidneys, and white blood cell count (looks for infection).  Potassium was a touch low, but he can discuss whether or not she needs a potassium supplement or other vitamin with her regular doctor. Continue to keep a close eye on how much she is eating and drinking to be sure she stays hydrated.  We recommend continue to follow-up closely with your primary care doctor.  Continue with the medication changes that were recommended by her doctor at the last visit.

## 2017-09-28 ENCOUNTER — Other Ambulatory Visit: Payer: Self-pay | Admitting: Internal Medicine

## 2017-09-28 DIAGNOSIS — E876 Hypokalemia: Secondary | ICD-10-CM

## 2017-09-28 MED ORDER — POTASSIUM CHLORIDE ER 10 MEQ PO CPCR: 20.0000 meq | ORAL_CAPSULE | Freq: Two times a day (BID) | ORAL | 0 refills | Status: DC

## 2017-10-16 ENCOUNTER — Ambulatory Visit: Payer: Medicare Other | Admitting: Internal Medicine

## 2017-10-23 ENCOUNTER — Other Ambulatory Visit: Payer: Self-pay | Admitting: Internal Medicine

## 2017-10-23 DIAGNOSIS — I10 Essential (primary) hypertension: Secondary | ICD-10-CM

## 2017-10-23 DIAGNOSIS — I5042 Chronic combined systolic (congestive) and diastolic (congestive) heart failure: Secondary | ICD-10-CM

## 2017-11-06 ENCOUNTER — Telehealth: Payer: Self-pay | Admitting: *Deleted

## 2017-11-06 NOTE — Telephone Encounter (Signed)
[facility]. Stated patient's daughter, Adela LankJacqueline is looking for her mother to go to an assisted Living facility. [facility] filled out FL2 form for Dr. Montez Moritaarter to review and sign. Placed in Dr. Celene Skeenarter's folder with attached OV note.

## 2017-11-13 ENCOUNTER — Telehealth: Payer: Self-pay | Admitting: Internal Medicine

## 2017-11-13 NOTE — Telephone Encounter (Signed)
[facility] called the office today.  Says service for patient has been discontinued.  She will be transferred to an Assisted Living Faciity.  FYI to Dr. Montez Moritaarter.Marland Kitchen.Marland Kitchen..Marland Kitchen

## 2017-11-14 ENCOUNTER — Encounter: Payer: Self-pay | Admitting: Internal Medicine

## 2017-11-16 ENCOUNTER — Ambulatory Visit: Payer: Medicare Other | Admitting: Internal Medicine

## 2017-11-21 ENCOUNTER — Telehealth: Payer: Self-pay | Admitting: *Deleted

## 2017-11-21 NOTE — Telephone Encounter (Signed)
[facility] needing [facility] for two-step PPD. Verbal order given to Jacie, LPN who will fax over written order for signature.

## 2018-01-16 ENCOUNTER — Encounter: Payer: Self-pay | Admitting: Internal Medicine

## 2018-01-29 ENCOUNTER — Other Ambulatory Visit: Payer: Self-pay

## 2018-01-29 DIAGNOSIS — I10 Essential (primary) hypertension: Secondary | ICD-10-CM

## 2018-01-29 DIAGNOSIS — I5042 Chronic combined systolic (congestive) and diastolic (congestive) heart failure: Secondary | ICD-10-CM

## 2018-01-29 MED ORDER — METOPROLOL TARTRATE 25 MG PO TABS: 12.5000 mg | ORAL_TABLET | Freq: Every day | ORAL | 1 refills | Status: DC

## 2018-01-29 NOTE — Telephone Encounter (Signed)
A medication refill error message was received from the pharmacy stating that the patient could not be found in their system. A error occurred due to previous request having the incorrect date of birth for patient.   A new rx was sent to the pharmacy for metoprolol 75 mg.

## 2018-03-20 ENCOUNTER — Telehealth: Payer: Self-pay | Admitting: *Deleted

## 2018-03-20 NOTE — Telephone Encounter (Signed)
[name] with [facility] called and stated that they need a Physician Certification Statement of Medical Necessity for transportation to appointment on 09/26/2017.  [name] is going to fax the form over for Dr. Montez Morita to review and sign. Awaiting Form.

## 2018-03-21 NOTE — Telephone Encounter (Signed)
Received the form from [facility] [phone]. Placed in Dr. Celene Skeen folder to review and sign. To be faxed back to [phone]

## 2018-03-28 ENCOUNTER — Ambulatory Visit: Payer: Medicare Other | Admitting: Nurse Practitioner

## 2018-04-04 ENCOUNTER — Encounter: Payer: Self-pay | Admitting: Nurse Practitioner

## 2018-04-04 ENCOUNTER — Ambulatory Visit (INDEPENDENT_AMBULATORY_CARE_PROVIDER_SITE_OTHER): Payer: Medicare Other | Admitting: Nurse Practitioner

## 2018-04-04 VITALS — BP 98/64 | HR 74 | Temp 97.6°F

## 2018-04-04 DIAGNOSIS — E876 Hypokalemia: Secondary | ICD-10-CM

## 2018-04-04 DIAGNOSIS — J439 Emphysema, unspecified: Secondary | ICD-10-CM | POA: Diagnosis not present

## 2018-04-04 DIAGNOSIS — R627 Adult failure to thrive: Secondary | ICD-10-CM | POA: Diagnosis not present

## 2018-04-04 DIAGNOSIS — F0280 Dementia in other diseases classified elsewhere without behavioral disturbance: Secondary | ICD-10-CM

## 2018-04-04 DIAGNOSIS — G309 Alzheimer's disease, unspecified: Secondary | ICD-10-CM

## 2018-04-04 DIAGNOSIS — F0150 Vascular dementia without behavioral disturbance: Secondary | ICD-10-CM

## 2018-04-04 DIAGNOSIS — I5042 Chronic combined systolic (congestive) and diastolic (congestive) heart failure: Secondary | ICD-10-CM

## 2018-04-04 DIAGNOSIS — I25119 Atherosclerotic heart disease of native coronary artery with unspecified angina pectoris: Secondary | ICD-10-CM

## 2018-04-04 MED ORDER — ACETAMINOPHEN 500 MG PO TABS: 500.0000 mg | ORAL_TABLET | Freq: Four times a day (QID) | ORAL | 0 refills | Status: AC | PRN

## 2018-04-04 NOTE — Patient Instructions (Addendum)
STOP METOPROLOL (blood pressure is low)   We will get lab work today in office

## 2018-04-04 NOTE — Progress Notes (Signed)
Careteam: Patient Care Team: Sharon Seller, NP as PCP - General (Geriatric Medicine) Sharee Holster, NP as Nurse Practitioner (Geriatric Medicine) [facility] (Skilled Nursing Facility)  Advanced Directive information Does Patient Have a Medical Advance Directive?: Yes, Type of Advance Directive: Out of facility DNR (pink MOST or yellow form), Pre-existing out of facility DNR order (yellow form or pink MOST form): Yellow form placed in chart (order not valid for inpatient use)  No Known Allergies  Chief Complaint  Patient presents with  . Acute Visit    Pt is being seen for completion on FL2 forms.      HPI: Patient is a 84 y.o. female seen in the office today to have FL2 filled out looking for long term placement for her.   COPD- no recent exacerbations, does not use albuterol.   Hx of CHF/HTN/CAD- remains on metoprolol and plavix. Blood pressure remains low.   Dementia- end stage with FTT- She is incontinent of urine and stool. She feeds herself. She needs assistance with meal prep, grooming, bathing. She is immobile and requires total assistance with transfers and most ADLs. Daughter uses w/c at home. Unable to weigh today, here via EMS because she can not bear weight    Review of Systems:  Review of Systems  Unable to perform ROS: Dementia    Past Medical History:  Diagnosis Date  . CHF (congestive heart failure) (HCC)   . Constipation   . COPD (chronic obstructive pulmonary disease) (HCC)   . Dementia (HCC)   . Frequency of urination   . Hypertension   . Incontinence   . Pneumonia   . Spinal stenosis    Past Surgical History:  Procedure Laterality Date  . COLONOSCOPY  1991   Social History:   reports that she quit smoking about 39 years ago. She has never used smokeless tobacco. She reports that she does not drink alcohol or use drugs.  Family History  Problem Relation Age of Onset  . Cancer Brother   . Hypertension Daughter   .  Cancer Son     Medications: Patient's Medications  New Prescriptions   No medications on file  Previous Medications   ALBUTEROL (PROVENTIL) (2.5 MG/3ML) 0.083% NEBULIZER SOLUTION    Take 3 mLs (2.5 mg total) by nebulization every 6 (six) hours as needed for wheezing or shortness of breath.   CLOPIDOGREL (PLAVIX) 75 MG TABLET    TAKE 1 TABLET (75 MG TOTAL) BY MOUTH DAILY.   METOPROLOL TARTRATE (LOPRESSOR) 25 MG TABLET    Take 0.5 tablets (12.5 mg total) by mouth daily. For blood pressure and heart   MULTIPLE VITAMIN (MULTIVITAMIN) TABLET    Take 1 tablet by mouth daily.   NUTRITIONAL SUPPLEMENTS (NUTRITIONAL SUPPLEMENT PO)    NAS (No Salt Added) diet - Mechanical Soft Texture.  Nectar thickened Fluids consistency, add fortified foods   POTASSIUM CHLORIDE (MICRO-K) 10 MEQ CR CAPSULE    Take 2 capsules (20 mEq total) by mouth 2 (two) times daily. Take for 3 days for low potassium  Modified Medications   No medications on file  Discontinued Medications   No medications on file     Physical Exam:  Vitals:   04/04/18 1407  BP: 98/64  Pulse: 74  Temp: 97.6 F (36.4 C)  TempSrc: Oral  SpO2: 95%   There is no height or weight on file to calculate BMI.  Physical Exam  Constitutional: She appears well-developed and well-nourished. No distress.  Thin frail female  Neck: No thyromegaly present.  Cardiovascular: Normal rate, regular rhythm and intact distal pulses.  Murmur heard. 2/6  Pulmonary/Chest: Effort normal and breath sounds normal.  Abdominal: Soft. Bowel sounds are normal. She exhibits no distension. There is no tenderness.  Musculoskeletal: She exhibits no edema.  Able to move all extremities   Lymphadenopathy:    She has no cervical adenopathy.  Neurological: She is alert.  Skin: Skin is warm and dry. She is not diaphoretic.  Psychiatric: She has a normal mood and affect.    Labs reviewed: Basic Metabolic Panel: Recent Labs    08/10/17 1055  08/13/17 0521  08/15/17 09/26/17 1207 09/27/17 2150  NA 159*   < > 144 142 150* 143  K 3.8   < > 3.4* 3.8 3.1* 3.1*  CL 120*   < > 109  --  119* 111  CO2 29   < > 24  --  23 23  GLUCOSE 94   < > 83  --  82 98  BUN 38*   < > 30* 28* 29* 19  CREATININE 1.72*   < > 1.33* 1.3* 1.42* 0.94  CALCIUM 9.4   < > 8.0*  --  8.4* 8.2*  PHOS  --   --  3.0  --   --   --   TSH 2.08  --   --   --   --   --    < > = values in this interval not displayed.   Liver Function Tests: Recent Labs    08/10/17 1055 08/11/17 1435 08/13/17 0521 08/15/17  AST 17 54*  --  20  ALT 11 14  --  10  ALKPHOS  --  59  --  57  BILITOT 0.5 1.4*  --   --   PROT 7.0 6.9  --   --   ALBUMIN  --  3.1* 2.5*  --    No results for input(s): LIPASE, AMYLASE in the last 8760 hours. No results for input(s): AMMONIA in the last 8760 hours. CBC: Recent Labs    08/11/17 1435  08/12/17 0040 08/15/17 09/26/17 1207 09/27/17 2150  WBC 13.8*  --  11.9* 10.1 11.3* 9.9  NEUTROABS 11.5*  --   --  7  --  7.2  HGB 11.8*   < > 10.1* 9.6* 9.3* 9.6*  HCT 37.7   < > 32.9* 28* 28.9* 30.7*  MCV 95.0  --  95.6  --  90.9 94.2  PLT 154  --  128* 150 190 154   < > = values in this interval not displayed.   Lipid Panel: No results for input(s): CHOL, HDL, LDLCALC, TRIG, CHOLHDL, LDLDIRECT in the last 8760 hours. TSH: Recent Labs    08/10/17 1055  TSH 2.08   A1C: Lab Results  Component Value Date   HGBA1C 5.5 06/30/2014     Assessment/Plan 1. Mixed Alzheimer's and vascular dementia (HCC) -advanced disease, looking for long term placement.  - DNR (Do Not Resuscitate)  2. Hypokalemia -not on supplement, will follow up - COMPLETE METABOLIC PANEL WITH GFR  3. Failure to thrive in adult Ongoing, unable to weigh pt at OV but expect to progress as disease progression with dementia occurs.  - DNR (Do Not Resuscitate)  4. Pulmonary emphysema, unspecified emphysema type (HCC) Stable, does not require inhalers.  5. Chronic combined systolic  and diastolic heart failure (HCC) Without signs of overload  6. Coronary artery disease involving native  coronary artery of native heart with angina pectoris (HCC) -continues on plavix - CBC with Differential/Platelets  Isaah Furry K. Biagio Borg  [facility] [phone]

## 2018-04-05 ENCOUNTER — Telehealth: Payer: Self-pay

## 2018-04-05 LAB — CBC WITH DIFFERENTIAL/PLATELET
Basophils Absolute: 39 cells/uL (ref 0–200)
Basophils Relative: 0.5 %
Eosinophils Absolute: 0 cells/uL — ABNORMAL LOW (ref 15–500)
Eosinophils Relative: 0 %
HCT: 28.3 % — ABNORMAL LOW (ref 35.0–45.0)
Hemoglobin: 9.5 g/dL — ABNORMAL LOW (ref 11.7–15.5)
Lymphs Abs: 1365 cells/uL (ref 850–3900)
MCH: 29.4 pg (ref 27.0–33.0)
MCHC: 33.6 g/dL (ref 32.0–36.0)
MCV: 87.6 fL (ref 80.0–100.0)
MONOS PCT: 7.3 %
MPV: 10.6 fL (ref 7.5–12.5)
NEUTROS PCT: 74.7 %
Neutro Abs: 5827 cells/uL (ref 1500–7800)
PLATELETS: 200 10*3/uL (ref 140–400)
RBC: 3.23 10*6/uL — AB (ref 3.80–5.10)
RDW: 14.5 % (ref 11.0–15.0)
Total Lymphocyte: 17.5 %
WBC: 7.8 10*3/uL (ref 3.8–10.8)
WBCMIX: 569 {cells}/uL (ref 200–950)

## 2018-04-05 LAB — COMPLETE METABOLIC PANEL WITH GFR
AG RATIO: 1.1 (calc) (ref 1.0–2.5)
ALT: 7 U/L (ref 6–29)
AST: 15 U/L (ref 10–35)
Albumin: 3.3 g/dL — ABNORMAL LOW (ref 3.6–5.1)
Alkaline phosphatase (APISO): 48 U/L (ref 33–130)
BILIRUBIN TOTAL: 0.4 mg/dL (ref 0.2–1.2)
BUN: 20 mg/dL (ref 7–25)
CO2: 25 mmol/L (ref 20–32)
Calcium: 8.6 mg/dL (ref 8.6–10.4)
Chloride: 116 mmol/L — ABNORMAL HIGH (ref 98–110)
Creat: 0.81 mg/dL (ref 0.60–0.88)
GFR, EST AFRICAN AMERICAN: 71 mL/min/{1.73_m2} (ref >=60)
GFR, Est Non African American: 61 mL/min/{1.73_m2} (ref >=60)
GLOBULIN: 2.9 g/dL (ref 1.9–3.7)
Glucose, Bld: 84 mg/dL (ref 65–139)
POTASSIUM: 3.3 mmol/L — AB (ref 3.5–5.3)
SODIUM: 149 mmol/L — AB (ref 135–146)
TOTAL PROTEIN: 6.2 g/dL (ref 6.1–8.1)

## 2018-04-05 MED ORDER — POTASSIUM CHLORIDE ER 10 MEQ PO TBCR: 10.0000 meq | EXTENDED_RELEASE_TABLET | Freq: Every day | ORAL | 6 refills | Status: AC

## 2018-04-05 NOTE — Telephone Encounter (Signed)
Medication list was updated and rx was sent to the pharmacy.  

## 2018-04-05 NOTE — Telephone Encounter (Signed)
-----   Message from Sharon Seller, NP sent at 04/05/2018  9:48 AM EST ----- Potassium level remains low, to start potassium supplement 10 meq daily. Sodium level elevated to increase water intake. Blood counts are stable. If she gets admitted to facility they will need to follow up BMP would recommend a follow up on lab in ~ 1 week if able in office

## 2018-04-28 DEATH — deceased

## 2018-10-02 ENCOUNTER — Ambulatory Visit: Payer: Self-pay

## 2019-10-31 IMAGING — CR DG CHEST 2V
2 series · 2 of 2 positions shown · non-contrast
Comparison: Chest radiograph 06/29/2014.

CLINICAL DATA: Worsening white blood cell count.

EXAM:
CHEST - 2 VIEW

[chest lat]
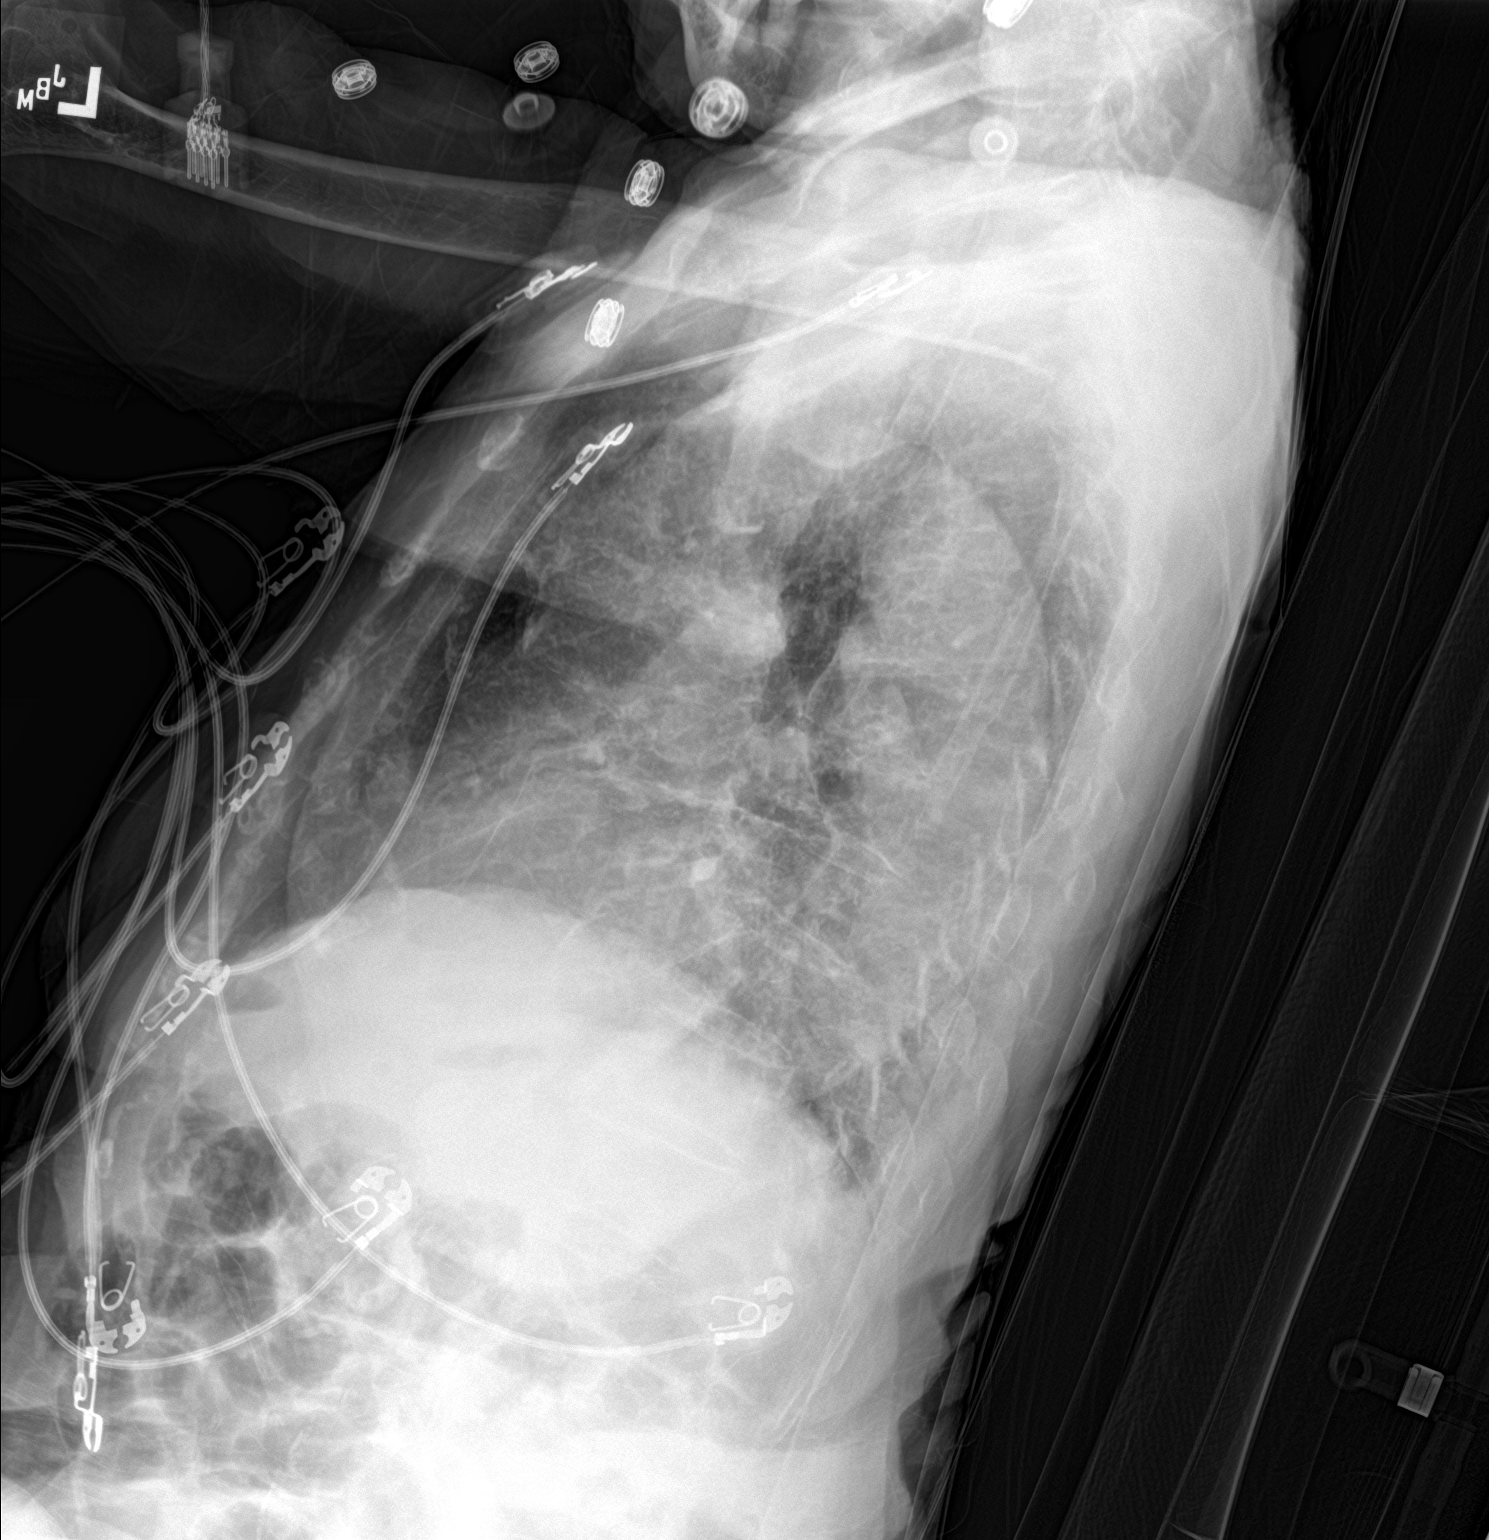

[chest ap]
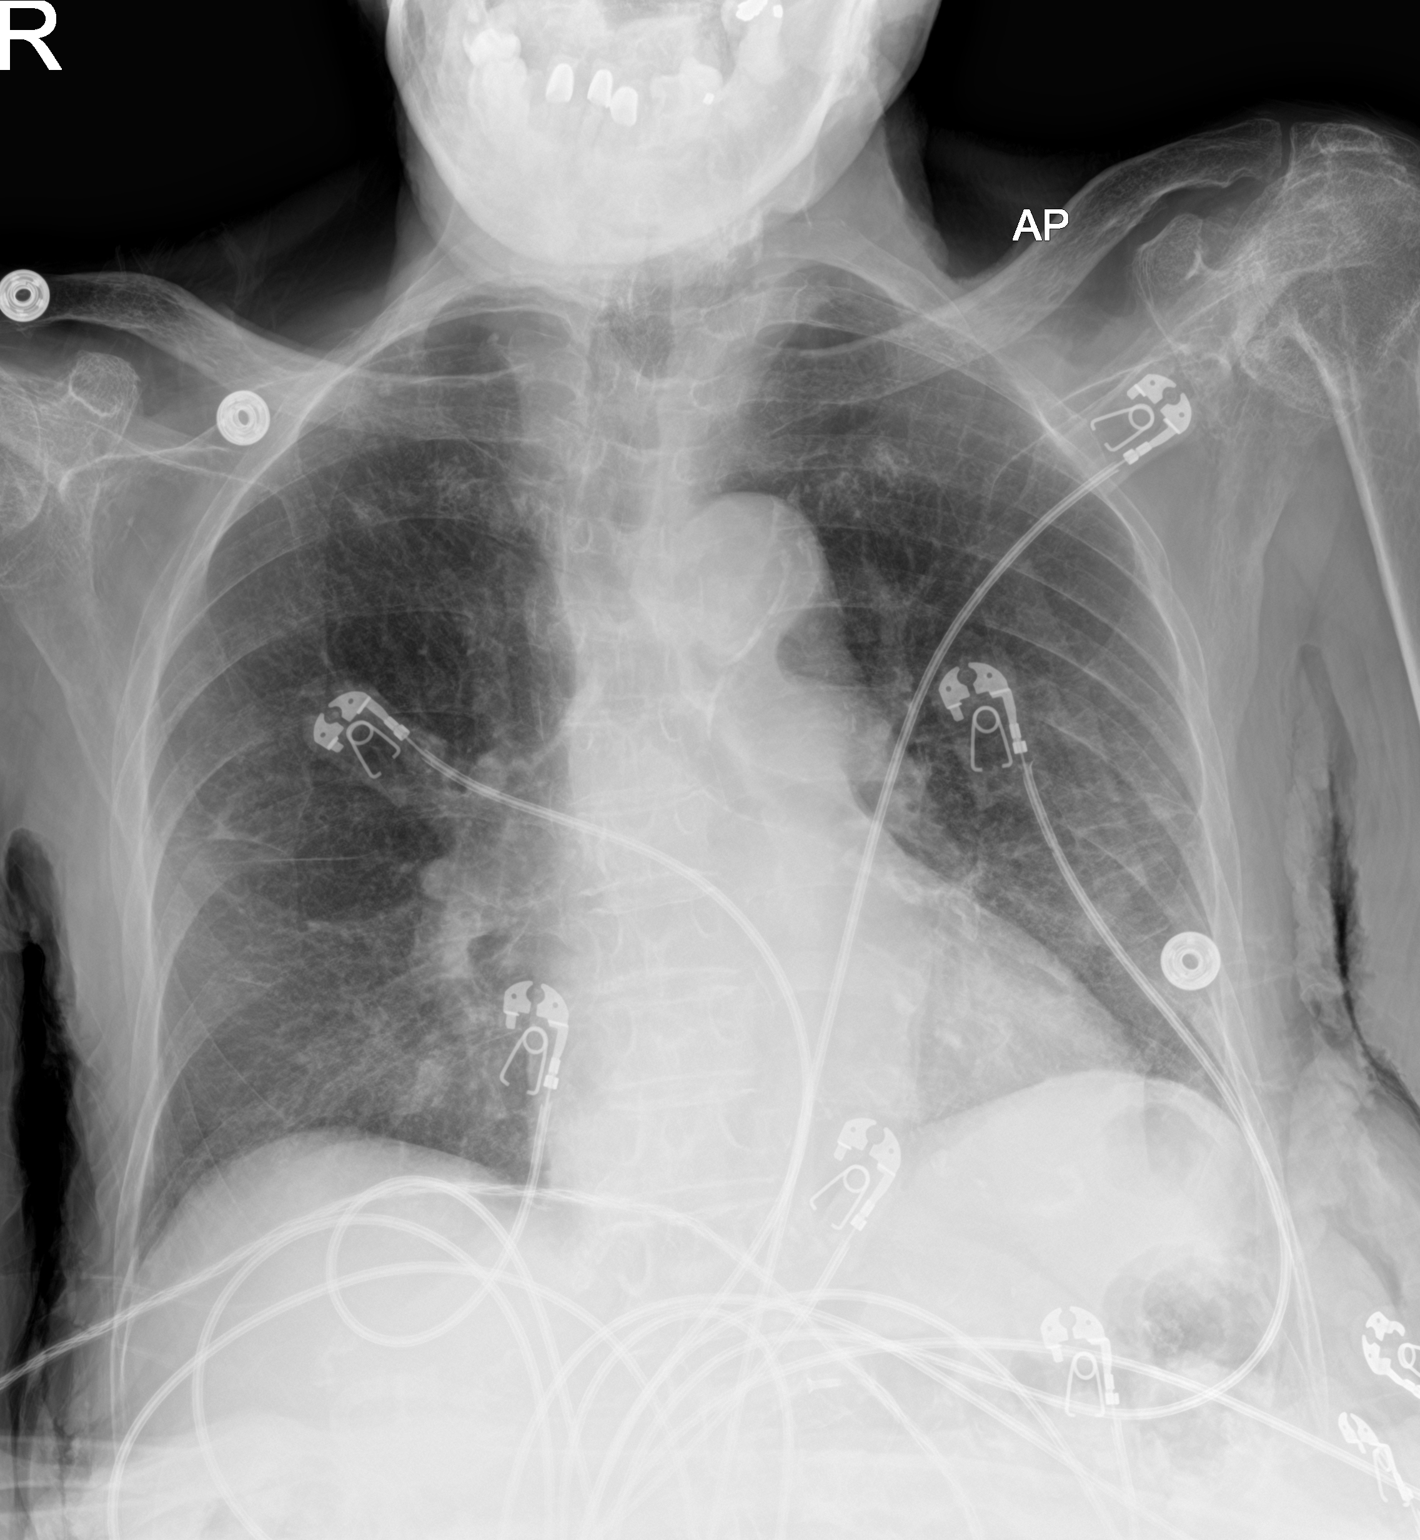

[2 of 2 positions shown; findings below may reference images not displayed]

FINDINGS: Monitoring leads overlie the patient. Stable cardiac and mediastinal
contours. Bilateral mid lower lung peripheral linear opacities. No
large area pulmonary consolidation. No pleural effusion or
pneumothorax.
IMPRESSION: No acute cardiopulmonary process. Peripheral linear opacities
favored to represent scarring.
